# Patient Record
Sex: Female | Born: 1950 | Race: Black or African American | Hispanic: No | Marital: Married | Smoking: Former smoker
Health system: Southern US, Community
[De-identification: ages and names within clinical notes are randomized; demographics above are authoritative.]

## PROBLEM LIST (undated history)

## (undated) DIAGNOSIS — I10 Essential (primary) hypertension: Secondary | ICD-10-CM

## (undated) DIAGNOSIS — E7800 Pure hypercholesterolemia, unspecified: Secondary | ICD-10-CM

## (undated) DIAGNOSIS — K219 Gastro-esophageal reflux disease without esophagitis: Secondary | ICD-10-CM

## (undated) HISTORY — PX: LAPAROSCOPIC HYSTERECTOMY: SHX1926

## (undated) HISTORY — DX: Pure hypercholesterolemia, unspecified: E78.00

## (undated) HISTORY — DX: Essential (primary) hypertension: I10

---

## 2009-05-02 LAB — HM PAP SMEAR: HM Pap smear: NEGATIVE

## 2009-05-20 ENCOUNTER — Ambulatory Visit: Payer: Self-pay | Admitting: Internal Medicine

## 2009-05-24 ENCOUNTER — Ambulatory Visit: Payer: Self-pay | Admitting: Internal Medicine

## 2009-07-02 LAB — HM COLONOSCOPY

## 2009-07-14 ENCOUNTER — Ambulatory Visit: Payer: Self-pay | Admitting: Gastroenterology

## 2010-05-25 LAB — BASIC METABOLIC PANEL
BUN: 13 mg/dL (ref 4–21)
Creatinine: 0.9 mg/dL (ref <=1.1)
Glucose: 103 mg/dL

## 2010-05-27 ENCOUNTER — Ambulatory Visit: Payer: Self-pay | Admitting: Internal Medicine

## 2010-10-03 ENCOUNTER — Telehealth: Payer: Self-pay | Admitting: Internal Medicine

## 2010-10-03 NOTE — Telephone Encounter (Signed)
See below note Pt is completely out of rx

## 2010-10-03 NOTE — Telephone Encounter (Signed)
Home # [phone] Pt walked in wanting to her rx of  Losartan potassium 50 mg  Refilled.  [facility]  [phone]  Please let pt know when rx is sent to drug store

## 2010-10-04 ENCOUNTER — Other Ambulatory Visit: Payer: Self-pay | Admitting: Internal Medicine

## 2010-10-04 MED ORDER — LOSARTAN POTASSIUM 50 MG PO TABS: 50.0000 mg | ORAL_TABLET | Freq: Every day | ORAL | Status: DC

## 2010-10-04 NOTE — Telephone Encounter (Signed)
Taken care of.  Sent you the refill.  [facility]

## 2010-12-08 ENCOUNTER — Ambulatory Visit (INDEPENDENT_AMBULATORY_CARE_PROVIDER_SITE_OTHER): Payer: BC Managed Care – PPO | Admitting: Internal Medicine

## 2010-12-08 ENCOUNTER — Encounter: Payer: Self-pay | Admitting: Internal Medicine

## 2010-12-08 DIAGNOSIS — E669 Obesity, unspecified: Secondary | ICD-10-CM

## 2010-12-08 DIAGNOSIS — I10 Essential (primary) hypertension: Secondary | ICD-10-CM

## 2010-12-08 DIAGNOSIS — Z0000 Encounter for general adult medical examination without abnormal findings: Secondary | ICD-10-CM

## 2010-12-08 LAB — CBC WITH DIFFERENTIAL/PLATELET
Basophils Absolute: 0 10*3/uL (ref 0.0–0.1)
Eosinophils Absolute: 0.1 10*3/uL (ref 0.0–0.7)
Lymphocytes Relative: 30.4 % (ref 12.0–46.0)
MCHC: 33.2 g/dL (ref 30.0–36.0)
Monocytes Relative: 12.6 % — ABNORMAL HIGH (ref 3.0–12.0)
Neutrophils Relative %: 55.7 % (ref 43.0–77.0)
Platelets: 178 10*3/uL (ref 150.0–400.0)
RDW: 14.9 % — ABNORMAL HIGH (ref 11.5–14.6)

## 2010-12-08 LAB — COMPREHENSIVE METABOLIC PANEL
ALT: 15 U/L (ref 0–35)
CO2: 26 mEq/L (ref 19–32)
Calcium: 9 mg/dL (ref 8.4–10.5)
Chloride: 107 mEq/L (ref 96–112)
GFR: 68.18 mL/min (ref >60.00)
Potassium: 3.9 mEq/L (ref 3.5–5.1)
Sodium: 140 mEq/L (ref 135–145)
Total Protein: 7.3 g/dL (ref 6.0–8.3)

## 2010-12-08 LAB — MICROALBUMIN / CREATININE URINE RATIO
Creatinine,U: 67 mg/dL
Microalb, Ur: 1.8 mg/dL (ref 0.0–1.9)

## 2010-12-08 NOTE — Patient Instructions (Signed)
Labs today.  Follow up in 6 months. 

## 2010-12-08 NOTE — Progress Notes (Signed)
Subjective:    Patient ID: Jennifer Huff, female    DOB: 1950-09-22, 60 y.o.   MRN: [id number]  HPI 60YO female with HTN presents for annual exam. No complaints today. Reports she has been well. Normal appetite. Not physically active. Has not been checking her BP.  Reports full compliance with her medications.  No chest pain, headache, palpitations.  Outpatient Encounter Prescriptions as of 12/08/2010  Medication Sig Dispense Refill  . amLODipine (NORVASC) 5 MG tablet Take 5 mg by mouth daily.        Marland Kitchen losartan (COZAAR) 50 MG tablet Take 1 tablet (50 mg total) by mouth daily.  30 tablet  11    Review of Systems  Constitutional: Negative for fever, chills, appetite change, fatigue and unexpected weight change.  HENT: Negative for ear pain, congestion, sore throat, trouble swallowing, neck pain, voice change and sinus pressure.   Eyes: Negative for visual disturbance.  Respiratory: Negative for cough, shortness of breath, wheezing and stridor.   Cardiovascular: Negative for chest pain, palpitations and leg swelling.  Gastrointestinal: Negative for nausea, vomiting, abdominal pain, diarrhea, constipation, blood in stool, abdominal distention and anal bleeding.  Genitourinary: Negative for dysuria and flank pain.  Musculoskeletal: Negative for myalgias, arthralgias and gait problem.  Skin: Negative for color change and rash.  Neurological: Negative for dizziness and headaches.  Hematological: Negative for adenopathy. Does not bruise/bleed easily.  Psychiatric/Behavioral: Negative for suicidal ideas, sleep disturbance and dysphoric mood. The patient is not nervous/anxious.    BP 128/80  Pulse 72  Temp(Src) 98.4 F (36.9 C) (Oral)  Ht 5' 6.5" (1.689 m)  Wt 264 lb (119.75 kg)  BMI 41.97 kg/m2  SpO2 97%     Objective:   Physical Exam  Constitutional: She is oriented to person, place, and time. She appears well-developed and well-nourished. No distress.  HENT:  Head: Normocephalic  and atraumatic.  Right Ear: External ear normal.  Left Ear: External ear normal.  Nose: Nose normal.  Mouth/Throat: Oropharynx is clear and moist. No oropharyngeal exudate.  Eyes: Conjunctivae are normal. Pupils are equal, round, and reactive to light. Right eye exhibits no discharge. Left eye exhibits no discharge. No scleral icterus.  Neck: Normal range of motion. Neck supple. No tracheal deviation present. No thyromegaly present.  Cardiovascular: Normal rate, regular rhythm, normal heart sounds and intact distal pulses.  Exam reveals no gallop and no friction rub.   No murmur heard. Pulmonary/Chest: Effort normal and breath sounds normal. No respiratory distress. She has no wheezes. She has no rales. She exhibits no tenderness. Right breast exhibits no inverted nipple, no mass, no nipple discharge, no skin change and no tenderness. Left breast exhibits no inverted nipple, no mass, no nipple discharge, no skin change and no tenderness. Breasts are symmetrical.  Abdominal: Soft. Bowel sounds are normal. She exhibits no distension and no mass. There is no tenderness. There is no rebound and no guarding.  Genitourinary: Rectum normal and vagina normal. No breast swelling, tenderness, discharge or bleeding. Pelvic exam was performed with patient prone. There is no rash, tenderness or lesion on the right labia. There is no rash, tenderness or lesion on the left labia. Cervix exhibits no motion tenderness, no discharge and no friability. Right adnexum displays no mass, no tenderness and no fullness. Left adnexum displays no mass, no tenderness and no fullness. No erythema or tenderness around the vagina. No vaginal discharge found.  Musculoskeletal: Normal range of motion. She exhibits no edema and no tenderness.  Lymphadenopathy:  She has no cervical adenopathy.  Neurological: She is alert and oriented to person, place, and time. No cranial nerve deficit. She exhibits normal muscle tone. Coordination  normal.  Skin: Skin is warm and dry. No rash noted. She is not diaphoretic. No erythema. No pallor.  Psychiatric: She has a normal mood and affect. Her behavior is normal. Judgment and thought content normal.          Assessment & Plan:  1. General exam - Exam normal today including breast and pelvic exam.  PAP is pending.  Will check CBC, CMP, lipids.  Follow up in 6 months.  2. Hypertension - BP well controlled on amlodipine and losartan.  Will check CMP and urine microalbumin with labs today. Follow up in 6 months.  3. Obesity - BMI 41.  Discussed healthy diet, including reducing saturated fat and increasing fiber.  Discussed regular exercise with goal of 5 days per week.

## 2010-12-09 ENCOUNTER — Other Ambulatory Visit (HOSPITAL_COMMUNITY)
Admission: RE | Admit: 2010-12-09 | Discharge: 2010-12-09 | Disposition: A | Discharge status: Home / Self Care | Payer: BC Managed Care – PPO | Source: Ambulatory Visit | Attending: Internal Medicine | Admitting: Internal Medicine

## 2010-12-09 DIAGNOSIS — Z1159 Encounter for screening for other viral diseases: Secondary | ICD-10-CM | POA: Insufficient documentation

## 2010-12-09 DIAGNOSIS — Z01419 Encounter for gynecological examination (general) (routine) without abnormal findings: Secondary | ICD-10-CM | POA: Insufficient documentation

## 2010-12-09 LAB — HM PAP SMEAR: HM Pap smear: NORMAL

## 2010-12-09 NOTE — Progress Notes (Signed)
Addended by: Jobie Quaker on: 12/09/2010 01:58 PM   Modules accepted: Orders

## 2010-12-29 ENCOUNTER — Encounter: Payer: Self-pay | Admitting: Internal Medicine

## 2011-02-20 ENCOUNTER — Other Ambulatory Visit: Payer: Self-pay | Admitting: *Deleted

## 2011-02-20 ENCOUNTER — Telehealth: Payer: Self-pay | Admitting: *Deleted

## 2011-02-20 MED ORDER — AMLODIPINE BESYLATE 5 MG PO TABS: 5.0000 mg | ORAL_TABLET | Freq: Every day | ORAL | Status: DC

## 2011-02-20 NOTE — Telephone Encounter (Signed)
Pt left Vm Friday, that I am just checking now. Patient had no Rfs of amlodipine, wants a call to know if this is a med she should continue. (YES, and f/u in June) I attempted to call pt at wk, she is not at her desk, need to call pt later.

## 2011-02-22 NOTE — Telephone Encounter (Signed)
Left mess to call office back w/any continued questions. RF was sent in 2/18, as I was out of the office Friday 2/15

## 2011-02-23 ENCOUNTER — Telehealth: Payer: Self-pay | Admitting: *Deleted

## 2011-02-23 NOTE — Telephone Encounter (Signed)
Patient requesting a call back regarding amlodipine.

## 2011-02-24 NOTE — Telephone Encounter (Signed)
See other phone note, i left pt VM advising her to check w/pharm b/c RF was sent in

## 2011-05-30 ENCOUNTER — Telehealth: Payer: Self-pay | Admitting: Internal Medicine

## 2011-05-30 DIAGNOSIS — Z1231 Encounter for screening mammogram for malignant neoplasm of breast: Secondary | ICD-10-CM

## 2011-05-30 NOTE — Telephone Encounter (Signed)
Patient is scheduled for 06/07/11 at 8:00 at [facility].  I spoke with Selena Batten when making this appointment.  Patient is aware of the appointment.

## 2011-05-30 NOTE — Telephone Encounter (Signed)
Jennifer Huff 05/30/2011 1:47 PM Signed  Pt received letter [facility] stating its time for her mammogram  Pt would like early am appointment  Order has been placed in Epic/SLS

## 2011-05-30 NOTE — Telephone Encounter (Signed)
I don't see the order, please make sure it is placed so Dr. Dan Humphreys can sign and I can fax to them.

## 2011-05-30 NOTE — Telephone Encounter (Signed)
Sorry, got her mixed with another [facility] Mammogram order done today, order is in now. Thanks/SLS

## 2011-05-30 NOTE — Telephone Encounter (Signed)
Pt received letter Jennifer Huff stating its time for her mammogram Pt would like early am appointment

## 2011-05-31 NOTE — Telephone Encounter (Signed)
She can have a screening one (I don't see any history of breast cancer or other abnormality).

## 2011-05-31 NOTE — Telephone Encounter (Signed)
Dr. Dan Humphreys should this patient have a diagnostic mammogram, or a HM mammogram?  Jasmine December placed order but its for a diagnostic one and when I called [place] I only scheduled screening.

## 2011-06-01 NOTE — Telephone Encounter (Signed)
That is what I have scheduled the appointment for.

## 2011-06-06 LAB — HM MAMMOGRAPHY

## 2011-06-07 ENCOUNTER — Ambulatory Visit: Payer: Self-pay | Admitting: Internal Medicine

## 2011-06-09 ENCOUNTER — Encounter: Payer: Self-pay | Admitting: Internal Medicine

## 2011-06-09 ENCOUNTER — Ambulatory Visit (INDEPENDENT_AMBULATORY_CARE_PROVIDER_SITE_OTHER): Payer: BC Managed Care – PPO | Admitting: Internal Medicine

## 2011-06-09 VITALS — BP 120/82 | HR 74 | Temp 98.8°F | Wt 267.0 lb

## 2011-06-09 DIAGNOSIS — Z23 Encounter for immunization: Secondary | ICD-10-CM

## 2011-06-09 DIAGNOSIS — I10 Essential (primary) hypertension: Secondary | ICD-10-CM

## 2011-06-09 LAB — MICROALBUMIN / CREATININE URINE RATIO: Creatinine,U: 89.9 mg/dL

## 2011-06-09 LAB — COMPREHENSIVE METABOLIC PANEL
ALT: 12 U/L (ref 0–35)
AST: 20 U/L (ref 0–37)
BUN: 11 mg/dL (ref 6–23)
CO2: 29 mEq/L (ref 19–32)
Creatinine, Ser: 0.9 mg/dL (ref 0.4–1.2)
GFR: 83.28 mL/min (ref >60.00)
Total Bilirubin: 0.6 mg/dL (ref 0.3–1.2)

## 2011-06-09 MED ORDER — AMLODIPINE BESYLATE 5 MG PO TABS: 5.0000 mg | ORAL_TABLET | Freq: Every day | ORAL | Status: DC

## 2011-06-09 NOTE — Progress Notes (Signed)
  Subjective:    Patient ID: Jennifer Huff, female    DOB: 1950-12-03, 61 y.o.   MRN: [id number]  HPI  61 year old female with history of hypertension and obesity presents for followup. She reports that she is generally feeling well. In regards to her hypertension, she reports full compliance with medications. She has not been regularly checking her blood pressure at home. She denies any headache, chest pain, palpitations.  Outpatient Encounter Prescriptions as of 06/09/2011  Medication Sig Dispense Refill  . amLODipine (NORVASC) 5 MG tablet Take 1 tablet (5 mg total) by mouth daily.  90 tablet  4  . losartan (COZAAR) 50 MG tablet Take 1 tablet (50 mg total) by mouth daily.  30 tablet  11    Review of Systems  Constitutional: Negative for fever, chills, appetite change, fatigue and unexpected weight change.  HENT: Positive for rhinorrhea. Negative for ear pain, congestion, sore throat, trouble swallowing, neck pain, voice change and sinus pressure.   Eyes: Negative for visual disturbance.  Respiratory: Negative for cough, shortness of breath, wheezing and stridor.   Cardiovascular: Negative for chest pain, palpitations and leg swelling.  Gastrointestinal: Negative for nausea, vomiting, abdominal pain, diarrhea, constipation, blood in stool, abdominal distention and anal bleeding.  Genitourinary: Negative for dysuria and flank pain.  Musculoskeletal: Negative for myalgias, arthralgias and gait problem.  Skin: Negative for color change and rash.  Neurological: Negative for dizziness and headaches.  Hematological: Negative for adenopathy. Does not bruise/bleed easily.  Psychiatric/Behavioral: Negative for suicidal ideas, sleep disturbance and dysphoric mood. The patient is not nervous/anxious.    BP 120/82  Pulse 74  Temp 98.8 F (37.1 C)  Wt 267 lb (121.11 kg)  SpO2 97%     Objective:   Physical Exam  Constitutional: She is oriented to person, place, and time. She appears  well-developed and well-nourished. No distress.  HENT:  Head: Normocephalic and atraumatic.  Right Ear: External ear normal.  Left Ear: External ear normal.  Nose: Nose normal.  Mouth/Throat: Oropharynx is clear and moist. No oropharyngeal exudate.  Eyes: Conjunctivae are normal. Pupils are equal, round, and reactive to light. Right eye exhibits no discharge. Left eye exhibits no discharge. No scleral icterus.  Neck: Normal range of motion. Neck supple. No tracheal deviation present. No thyromegaly present.  Cardiovascular: Normal rate, regular rhythm, normal heart sounds and intact distal pulses.  Exam reveals no gallop and no friction rub.   No murmur heard. Pulmonary/Chest: Effort normal and breath sounds normal. No respiratory distress. She has no wheezes. She has no rales. She exhibits no tenderness.  Musculoskeletal: Normal range of motion. She exhibits no edema and no tenderness.  Lymphadenopathy:    She has no cervical adenopathy.  Neurological: She is alert and oriented to person, place, and time. No cranial nerve deficit. She exhibits normal muscle tone. Coordination normal.  Skin: Skin is warm and dry. No rash noted. She is not diaphoretic. No erythema. No pallor.  Psychiatric: She has a normal mood and affect. Her behavior is normal. Judgment and thought content normal.          Assessment & Plan:

## 2011-06-09 NOTE — Assessment & Plan Note (Signed)
Blood pressure well-controlled on current medications. Will check renal function with labs today. Patient will followup in 6 months or sooner if needed.

## 2011-09-06 ENCOUNTER — Other Ambulatory Visit: Payer: Self-pay | Admitting: *Deleted

## 2011-09-06 MED ORDER — LOSARTAN POTASSIUM 50 MG PO TABS: 50.0000 mg | ORAL_TABLET | Freq: Every day | ORAL | Status: DC

## 2011-12-11 ENCOUNTER — Ambulatory Visit (INDEPENDENT_AMBULATORY_CARE_PROVIDER_SITE_OTHER): Payer: BC Managed Care – PPO | Admitting: Internal Medicine

## 2011-12-11 ENCOUNTER — Encounter: Payer: Self-pay | Admitting: Internal Medicine

## 2011-12-11 VITALS — BP 128/72 | HR 77 | Temp 98.5°F | Resp 16 | Ht 66.75 in | Wt 262.8 lb

## 2011-12-11 DIAGNOSIS — Z1331 Encounter for screening for depression: Secondary | ICD-10-CM

## 2011-12-11 DIAGNOSIS — Z0000 Encounter for general adult medical examination without abnormal findings: Secondary | ICD-10-CM

## 2011-12-11 LAB — CBC WITH DIFFERENTIAL/PLATELET
Basophils Absolute: 0 10*3/uL (ref 0.0–0.1)
Eosinophils Absolute: 0.1 10*3/uL (ref 0.0–0.7)
Hemoglobin: 12.6 g/dL (ref 12.0–15.0)
Lymphocytes Relative: 29.8 % (ref 12.0–46.0)
MCHC: 32.5 g/dL (ref 30.0–36.0)
Monocytes Relative: 10.9 % (ref 3.0–12.0)
Neutro Abs: 3.4 10*3/uL (ref 1.4–7.7)
Neutrophils Relative %: 57.4 % (ref 43.0–77.0)
RDW: 14.4 % (ref 11.5–14.6)

## 2011-12-11 LAB — COMPREHENSIVE METABOLIC PANEL
ALT: 15 U/L (ref 0–35)
AST: 18 U/L (ref 0–37)
Albumin: 4 g/dL (ref 3.5–5.2)
BUN: 13 mg/dL (ref 6–23)
CO2: 28 mEq/L (ref 19–32)
Calcium: 9.4 mg/dL (ref 8.4–10.5)
Chloride: 105 mEq/L (ref 96–112)
Creatinine, Ser: 1.1 mg/dL (ref 0.4–1.2)
GFR: 64.43 mL/min (ref >60.00)
Potassium: 4.1 mEq/L (ref 3.5–5.1)

## 2011-12-11 LAB — LIPID PANEL
Cholesterol: 175 mg/dL (ref 0–200)
HDL: 47.2 mg/dL (ref >39.00)
Triglycerides: 92 mg/dL (ref 0.0–149.0)
VLDL: 18.4 mg/dL (ref 0.0–40.0)

## 2011-12-11 LAB — MICROALBUMIN / CREATININE URINE RATIO: Microalb Creat Ratio: 4.3 mg/g (ref 0.0–30.0)

## 2011-12-11 MED ORDER — ZOSTER VACCINE LIVE 19400 UNT/0.65ML ~~LOC~~ SOLR: 0.6500 mL | Freq: Once | SUBCUTANEOUS | Status: DC

## 2011-12-11 NOTE — Assessment & Plan Note (Signed)
General medical exam including breast exam is normal today. Pap is deferred as this was normal in December 2012. Health maintenance is up to date with the exception of Zostavax, prescription given for this today. Will check basic labs including CBC, CMP, lipid profile. Encouraged better compliance with healthy diet and regular physical activity such as walking. Followup in 6 months or sooner as needed.

## 2011-12-11 NOTE — Progress Notes (Signed)
Subjective:    Patient ID: Jennifer Huff, female    DOB: 1950-03-15, 61 y.o.   MRN: [id number]  HPI 61 year old female with history of hypertension presents for annual exam. She reports she is generally doing well. She reports full compliance with her medications. She does not check her blood pressure at home. She denies any chest pain, headache, palpitations. She reports a normal appetite. She denies any change in bowel habits. She notes that she follows a healthy diet, typically eating one big meal per day. This usually consists of fast food. She does not exercise.  Outpatient Encounter Prescriptions as of 12/11/2011  Medication Sig Dispense Refill  . amLODipine (NORVASC) 5 MG tablet Take 1 tablet (5 mg total) by mouth daily.  90 tablet  4  . losartan (COZAAR) 50 MG tablet Take 1 tablet (50 mg total) by mouth daily.  90 tablet  3  . zoster vaccine live, PF, (ZOSTAVAX) 40981 UNT/0.65ML injection Inject 19,400 Units into the skin once.  1 each  0   BP 128/72  Pulse 77  Temp 98.5 F (36.9 C) (Oral)  Resp 16  Ht 5' 6.75" (1.695 m)  Wt 262 lb 12 oz (119.183 kg)  BMI 41.46 kg/m2  SpO2 99%  Review of Systems  Constitutional: Negative for fever, chills, appetite change, fatigue and unexpected weight change.  HENT: Negative for ear pain, congestion, sore throat, trouble swallowing, neck pain, voice change and sinus pressure.   Eyes: Negative for visual disturbance.  Respiratory: Negative for cough, shortness of breath, wheezing and stridor.   Cardiovascular: Negative for chest pain, palpitations and leg swelling.  Gastrointestinal: Negative for nausea, vomiting, abdominal pain, diarrhea, constipation, blood in stool, abdominal distention and anal bleeding.  Genitourinary: Negative for dysuria and flank pain.  Musculoskeletal: Negative for myalgias, arthralgias and gait problem.  Skin: Negative for color change and rash.  Neurological: Negative for dizziness and headaches.   Hematological: Negative for adenopathy. Does not bruise/bleed easily.  Psychiatric/Behavioral: Negative for suicidal ideas, sleep disturbance and dysphoric mood. The patient is not nervous/anxious.        Objective:   Physical Exam  Constitutional: She is oriented to person, place, and time. She appears well-developed and well-nourished. No distress.  HENT:  Head: Normocephalic and atraumatic.  Right Ear: External ear normal.  Left Ear: External ear normal.  Nose: Nose normal.  Mouth/Throat: Oropharynx is clear and moist. No oropharyngeal exudate.  Eyes: Conjunctivae normal are normal. Pupils are equal, round, and reactive to light. Right eye exhibits no discharge. Left eye exhibits no discharge. No scleral icterus.  Neck: Normal range of motion. Neck supple. No tracheal deviation present. No thyromegaly present.  Cardiovascular: Normal rate, regular rhythm, normal heart sounds and intact distal pulses.  Exam reveals no gallop and no friction rub.   No murmur heard. Pulmonary/Chest: Effort normal and breath sounds normal. No accessory muscle usage. Not tachypneic. No respiratory distress. She has no decreased breath sounds. She has no wheezes. She has no rales. She exhibits no tenderness. Right breast exhibits no inverted nipple, no mass, no nipple discharge, no skin change and no tenderness. Left breast exhibits no inverted nipple, no mass, no nipple discharge, no skin change and no tenderness. Breasts are symmetrical.  Abdominal: Soft. Bowel sounds are normal. She exhibits no distension and no mass. There is no tenderness. There is no rebound and no guarding.  Musculoskeletal: Normal range of motion. She exhibits no edema and no tenderness.  Lymphadenopathy:    She has  no cervical adenopathy.  Neurological: She is alert and oriented to person, place, and time. No cranial nerve deficit. She exhibits normal muscle tone. Coordination normal.  Skin: Skin is warm and dry. Lesion (few skin  colored papules c/w achrocordons noted over anterior chest and neck) noted. No rash noted. She is not diaphoretic. No erythema. No pallor.  Psychiatric: She has a normal mood and affect. Her behavior is normal. Judgment and thought content normal.          Assessment & Plan:

## 2011-12-12 LAB — VITAMIN D 25 HYDROXY (VIT D DEFICIENCY, FRACTURES): Vit D, 25-Hydroxy: 23 ng/mL — ABNORMAL LOW (ref 30–89)

## 2012-06-07 ENCOUNTER — Ambulatory Visit: Payer: Self-pay | Admitting: Internal Medicine

## 2012-06-10 ENCOUNTER — Ambulatory Visit (INDEPENDENT_AMBULATORY_CARE_PROVIDER_SITE_OTHER): Payer: BC Managed Care – PPO | Admitting: Internal Medicine

## 2012-06-10 ENCOUNTER — Encounter: Payer: Self-pay | Admitting: Internal Medicine

## 2012-06-10 VITALS — BP 126/72 | HR 71 | Temp 98.4°F | Wt 265.0 lb

## 2012-06-10 DIAGNOSIS — I10 Essential (primary) hypertension: Secondary | ICD-10-CM

## 2012-06-10 LAB — COMPREHENSIVE METABOLIC PANEL
AST: 17 U/L (ref 0–37)
Alkaline Phosphatase: 68 U/L (ref 39–117)
BUN: 12 mg/dL (ref 6–23)
Creatinine, Ser: 1.1 mg/dL (ref 0.4–1.2)
Potassium: 3.9 mEq/L (ref 3.5–5.1)
Total Bilirubin: 0.6 mg/dL (ref 0.3–1.2)

## 2012-06-10 LAB — MICROALBUMIN / CREATININE URINE RATIO
Creatinine,U: 80.2 mg/dL
Microalb Creat Ratio: 4.4 mg/g (ref 0.0–30.0)

## 2012-06-10 MED ORDER — AMLODIPINE BESYLATE 5 MG PO TABS: 5.0000 mg | ORAL_TABLET | Freq: Every day | ORAL | Status: DC

## 2012-06-10 MED ORDER — LOSARTAN POTASSIUM 50 MG PO TABS: 50.0000 mg | ORAL_TABLET | Freq: Every day | ORAL | Status: DC

## 2012-06-10 NOTE — Progress Notes (Signed)
  Subjective:    Patient ID: Jennifer Huff, female    DOB: July 25, 1950, 62 y.o.   MRN: [id number]  HPI 62YO female with h/o HTN presents for follow up. Doing well. No concerns today. Compliant with meds. No chest pain, palpitations, headache. No new concerns.  Outpatient Encounter Prescriptions as of 06/10/2012  Medication Sig Dispense Refill  . amLODipine (NORVASC) 5 MG tablet Take 1 tablet (5 mg total) by mouth daily.  90 tablet  4  . losartan (COZAAR) 50 MG tablet Take 1 tablet (50 mg total) by mouth daily.  90 tablet  4   No facility-administered encounter medications on file as of 06/10/2012.   BP 126/72  Pulse 71  Temp(Src) 98.4 F (36.9 C) (Oral)  Wt 265 lb (120.203 kg)  BMI 41.84 kg/m2  SpO2 96%  Review of Systems  Constitutional: Negative for fever, chills, appetite change, fatigue and unexpected weight change.  HENT: Negative for ear pain, congestion, sore throat, trouble swallowing, neck pain, voice change and sinus pressure.   Eyes: Negative for visual disturbance.  Respiratory: Negative for cough, shortness of breath, wheezing and stridor.   Cardiovascular: Negative for chest pain, palpitations and leg swelling.  Gastrointestinal: Negative for nausea, vomiting, abdominal pain, diarrhea, constipation, blood in stool, abdominal distention and anal bleeding.  Genitourinary: Negative for dysuria and flank pain.  Musculoskeletal: Negative for myalgias, arthralgias and gait problem.  Skin: Negative for color change and rash.  Neurological: Negative for dizziness and headaches.  Hematological: Negative for adenopathy. Does not bruise/bleed easily.  Psychiatric/Behavioral: Negative for suicidal ideas, sleep disturbance and dysphoric mood. The patient is not nervous/anxious.        Objective:   Physical Exam  Constitutional: She is oriented to person, place, and time. She appears well-developed and well-nourished. No distress.  HENT:  Head: Normocephalic and atraumatic.   Right Ear: External ear normal.  Left Ear: External ear normal.  Nose: Nose normal.  Mouth/Throat: Oropharynx is clear and moist. No oropharyngeal exudate.  Eyes: Conjunctivae are normal. Pupils are equal, round, and reactive to light. Right eye exhibits no discharge. Left eye exhibits no discharge. No scleral icterus.  Neck: Normal range of motion. Neck supple. No tracheal deviation present. No thyromegaly present.  Cardiovascular: Normal rate, regular rhythm, normal heart sounds and intact distal pulses.  Exam reveals no gallop and no friction rub.   No murmur heard. Pulmonary/Chest: Effort normal and breath sounds normal. No accessory muscle usage. Not tachypneic. No respiratory distress. She has no decreased breath sounds. She has no wheezes. She has no rhonchi. She has no rales. She exhibits no tenderness.  Musculoskeletal: Normal range of motion. She exhibits no edema and no tenderness.  Lymphadenopathy:    She has no cervical adenopathy.  Neurological: She is alert and oriented to person, place, and time. No cranial nerve deficit. She exhibits normal muscle tone. Coordination normal.  Skin: Skin is warm and dry. No rash noted. She is not diaphoretic. No erythema. No pallor.  Psychiatric: She has a normal mood and affect. Her behavior is normal. Judgment and thought content normal.          Assessment & Plan:

## 2012-06-10 NOTE — Assessment & Plan Note (Signed)
BP Readings from Last 3 Encounters:  06/10/12 126/72  12/11/11 128/72  06/09/11 120/82   BP well controlled on current medications. Will continue. Will check renal function with labs today.

## 2012-06-24 ENCOUNTER — Encounter: Payer: Self-pay | Admitting: Internal Medicine

## 2012-12-16 ENCOUNTER — Ambulatory Visit (INDEPENDENT_AMBULATORY_CARE_PROVIDER_SITE_OTHER): Payer: BC Managed Care – PPO | Admitting: Internal Medicine

## 2012-12-16 ENCOUNTER — Encounter: Payer: Self-pay | Admitting: *Deleted

## 2012-12-16 ENCOUNTER — Encounter (INDEPENDENT_AMBULATORY_CARE_PROVIDER_SITE_OTHER): Payer: Self-pay

## 2012-12-16 ENCOUNTER — Encounter: Payer: Self-pay | Admitting: Internal Medicine

## 2012-12-16 VITALS — BP 130/82 | HR 68 | Temp 98.2°F | Ht 66.0 in | Wt 263.0 lb

## 2012-12-16 DIAGNOSIS — Z0000 Encounter for general adult medical examination without abnormal findings: Secondary | ICD-10-CM

## 2012-12-16 DIAGNOSIS — I10 Essential (primary) hypertension: Secondary | ICD-10-CM

## 2012-12-16 LAB — CBC WITH DIFFERENTIAL/PLATELET
Basophils Relative: 0.4 % (ref 0.0–3.0)
Eosinophils Absolute: 0.1 10*3/uL (ref 0.0–0.7)
Lymphocytes Relative: 29 % (ref 12.0–46.0)
MCHC: 33.8 g/dL (ref 30.0–36.0)
Monocytes Absolute: 0.7 10*3/uL (ref 0.1–1.0)
Neutrophils Relative %: 58.5 % (ref 43.0–77.0)
Platelets: 174 10*3/uL (ref 150.0–400.0)
RBC: 4.5 Mil/uL (ref 3.87–5.11)
WBC: 6.6 10*3/uL (ref 4.5–10.5)

## 2012-12-16 LAB — COMPREHENSIVE METABOLIC PANEL
ALT: 14 U/L (ref 0–35)
AST: 18 U/L (ref 0–37)
Albumin: 4 g/dL (ref 3.5–5.2)
Alkaline Phosphatase: 79 U/L (ref 39–117)
Calcium: 9.2 mg/dL (ref 8.4–10.5)
Chloride: 106 mEq/L (ref 96–112)
Potassium: 3.9 mEq/L (ref 3.5–5.1)
Sodium: 139 mEq/L (ref 135–145)

## 2012-12-16 LAB — LIPID PANEL
LDL Cholesterol: 112 mg/dL — ABNORMAL HIGH (ref 0–99)
Total CHOL/HDL Ratio: 4

## 2012-12-16 LAB — MICROALBUMIN / CREATININE URINE RATIO
Creatinine,U: 139.8 mg/dL
Microalb Creat Ratio: 3 mg/g (ref 0.0–30.0)
Microalb, Ur: 4.2 mg/dL — ABNORMAL HIGH (ref 0.0–1.9)

## 2012-12-16 LAB — TSH: TSH: 1.49 u[IU]/mL (ref 0.35–5.50)

## 2012-12-16 NOTE — Progress Notes (Signed)
   Subjective:    Patient ID: Jennifer Huff, female    DOB: [birth year], 62 y.o.   MRN: [id number]  HPI 62YO female with HTN presents for annual exam. Doing well. No concerns today. Compliant with medication. Trying to follow healthier diet and gets some activity by walking. UTD on flu vaccine, mammogram and PAP.  Outpatient Encounter Prescriptions as of 12/16/2012  Medication Sig  . amLODipine (NORVASC) 5 MG tablet Take 1 tablet (5 mg total) by mouth daily.  Marland Kitchen losartan (COZAAR) 50 MG tablet Take 1 tablet (50 mg total) by mouth daily.   BP 130/82  Pulse 68  Temp(Src) 98.2 F (36.8 C) (Oral)  Ht 5\' 6"  (1.676 m)  SpO2 97%  Review of Systems  Constitutional: Negative for fever, chills, appetite change, fatigue and unexpected weight change.  HENT: Negative for congestion, ear pain, sinus pressure, sore throat, trouble swallowing and voice change.   Eyes: Negative for visual disturbance.  Respiratory: Negative for cough, shortness of breath, wheezing and stridor.   Cardiovascular: Negative for chest pain, palpitations and leg swelling.  Gastrointestinal: Negative for nausea, vomiting, abdominal pain, diarrhea, constipation, blood in stool, abdominal distention and anal bleeding.  Genitourinary: Negative for dysuria and flank pain.  Musculoskeletal: Negative for arthralgias, gait problem, myalgias and neck pain.  Skin: Negative for color change and rash.  Neurological: Negative for dizziness and headaches.  Hematological: Negative for adenopathy. Does not bruise/bleed easily.  Psychiatric/Behavioral: Negative for suicidal ideas, sleep disturbance and dysphoric mood. The patient is not nervous/anxious.        Objective:   Physical Exam  Constitutional: She is oriented to person, place, and time. She appears well-developed and well-nourished. No distress.  HENT:  Head: Normocephalic and atraumatic.  Right Ear: External ear normal.  Left Ear: External ear normal.  Nose: Nose  normal.  Mouth/Throat: Oropharynx is clear and moist. No oropharyngeal exudate.  Eyes: Conjunctivae are normal. Pupils are equal, round, and reactive to light. Right eye exhibits no discharge. Left eye exhibits no discharge. No scleral icterus.  Neck: Normal range of motion. Neck supple. No tracheal deviation present. No thyromegaly present.  Cardiovascular: Normal rate, regular rhythm, normal heart sounds and intact distal pulses.  Exam reveals no gallop and no friction rub.   No murmur heard. Pulmonary/Chest: Effort normal and breath sounds normal. No accessory muscle usage. Not tachypneic. No respiratory distress. She has no decreased breath sounds. She has no wheezes. She has no rales. She exhibits no tenderness. Right breast exhibits no inverted nipple, no mass, no nipple discharge, no skin change and no tenderness. Left breast exhibits no inverted nipple, no mass, no nipple discharge, no skin change and no tenderness. Breasts are symmetrical.  Abdominal: Soft. Bowel sounds are normal. She exhibits no distension and no mass. There is no tenderness. There is no rebound and no guarding.  Musculoskeletal: Normal range of motion. She exhibits no edema and no tenderness.  Lymphadenopathy:    She has no cervical adenopathy.  Neurological: She is alert and oriented to person, place, and time. No cranial nerve deficit. She exhibits normal muscle tone. Coordination normal.  Skin: Skin is warm and dry. No rash noted. She is not diaphoretic. No erythema. No pallor.  Psychiatric: She has a normal mood and affect. Her behavior is normal. Judgment and thought content normal.          Assessment & Plan:

## 2012-12-16 NOTE — Assessment & Plan Note (Signed)
General medical exam including breast exam normal today. PAP and pelvic deferred as normal, HPV neg in 2012, plan repeat 2015. Mammogram UTD and reviewed from 06/2012. Colonoscopy UTD. Will check labs today including CBC, CMP, lipids, Vit D, TSH. Encouraged healthy diet and exercise with goal of weight loss. Immunizations UTD.

## 2012-12-16 NOTE — Progress Notes (Signed)
Pre-visit discussion using our clinic review tool. No additional management support is needed unless otherwise documented below in the visit note.  

## 2012-12-17 ENCOUNTER — Encounter: Payer: Self-pay | Admitting: *Deleted

## 2013-03-10 ENCOUNTER — Encounter: Payer: Self-pay | Admitting: Adult Health

## 2013-03-10 ENCOUNTER — Ambulatory Visit (INDEPENDENT_AMBULATORY_CARE_PROVIDER_SITE_OTHER): Payer: BC Managed Care – PPO | Admitting: Adult Health

## 2013-03-10 VITALS — BP 122/78 | HR 78 | Temp 98.7°F | Resp 12 | Wt 265.0 lb

## 2013-03-10 DIAGNOSIS — M25541 Pain in joints of right hand: Secondary | ICD-10-CM | POA: Insufficient documentation

## 2013-03-10 DIAGNOSIS — M7710 Lateral epicondylitis, unspecified elbow: Secondary | ICD-10-CM

## 2013-03-10 DIAGNOSIS — M778 Other enthesopathies, not elsewhere classified: Secondary | ICD-10-CM

## 2013-03-10 DIAGNOSIS — M7581 Other shoulder lesions, right shoulder: Principal | ICD-10-CM

## 2013-03-10 DIAGNOSIS — M719 Bursopathy, unspecified: Secondary | ICD-10-CM

## 2013-03-10 DIAGNOSIS — M67919 Unspecified disorder of synovium and tendon, unspecified shoulder: Secondary | ICD-10-CM

## 2013-03-10 DIAGNOSIS — M79609 Pain in unspecified limb: Secondary | ICD-10-CM

## 2013-03-10 MED ORDER — CYCLOBENZAPRINE HCL 5 MG PO TABS: 5.0000 mg | ORAL_TABLET | Freq: Three times a day (TID) | ORAL | Status: DC | PRN

## 2013-03-10 NOTE — Progress Notes (Signed)
Pre visit review using our clinic review tool, if applicable. No additional management support is needed unless otherwise documented below in the visit note. 

## 2013-03-10 NOTE — Patient Instructions (Addendum)
   Take ibuprofen 400 mg every 6 hours for the next 4 days. Take with food.  You can take tylenol as well. Do not exceed more than 4000 mg in a 24 hour period.  For your trigger finger (thumb) use a wrist splint that will keep your thumb immobilized.   Also take flexeril for muscle spasms. This will make you sleepy so only take this at bedtime when you are working.  Apply ice alternating with moist heat to the affected areas for 15 min at a time. Do this approximately 3-4 times daily.  If no improvement after 1 week please let me know and I will refer you to orthopedic.

## 2013-03-10 NOTE — Progress Notes (Signed)
Patient ID: Jennifer Huff, female   DOB: 11/08/1950, 63 y.o.   MRN: [id number]    Subjective:    Patient ID: Jennifer Huff, female    DOB: Sep 10, 1950, 63 y.o.   MRN: [id number]  HPI  Pt is a pleasant 63 y/o female who presents to clinic with c/o of right shoulder, elbow, thumb pain. Symptoms began on 03/07/13. She has been taking tylenol 325 mg every 6 hours. She does not recall doing anything that may have contributed to her pain. She reports that when she puts on her seat belt she reaches back with her right arm and has noticed some discomfort. She also reports having a 68 month old grandson which she often carries. She works as a Licensed conveyancer and carries heavy books which could be contributing to her symptoms. She is also experiencing some shooting pain but this is not continuous.   Past Medical History  Diagnosis Date  . HTN (hypertension)   . Vitamin D deficiency   . Cataract     Current Outpatient Prescriptions on File Prior to Visit  Medication Sig Dispense Refill  . amLODipine (NORVASC) 5 MG tablet Take 1 tablet (5 mg total) by mouth daily.  90 tablet  4  . losartan (COZAAR) 50 MG tablet Take 1 tablet (50 mg total) by mouth daily.  90 tablet  4   No current facility-administered medications on file prior to visit.     Review of Systems  Musculoskeletal:       Right shoulder, elbow, thumb pain - trigger finger  Neurological: Negative for numbness.  All other systems reviewed and are negative.       Objective:  BP 122/78  Pulse 78  Temp(Src) 98.7 F (37.1 C) (Oral)  Resp 12  Wt 265 lb (120.203 kg)  SpO2 96%   Physical Exam  Constitutional: She is oriented to person, place, and time. No distress.  Cardiovascular: Normal rate and regular rhythm.   Pulmonary/Chest: Effort normal. No respiratory distress.  Musculoskeletal: Normal range of motion. She exhibits tenderness. She exhibits no edema.  Right thumb trigger "catches". Pain with palpation of lateral  epicondyle. Shoulder did not exhibit any point tenderness. She does have tenderness with palpation of trapezius muscle above shoulder. Full ROM. No crepitus observed.  Neurological: She is alert and oriented to person, place, and time. Coordination normal.       Assessment & Plan:    1. Tendonitis of shoulder, right Conservative treatment involving rest, ibuprofen for inflammation, ice, heat. If no improvement within 1 week will refer to ortho  2. Epicondylitis, lateral Suspect related to job with lifting heavy books. Conservative treatment. See pt instructions for specific POC.  3. Pain in thumb joint with movement of right hand ?De Quervain's tenosynovitis. Conservative treatment. See pt instructions for specific POC. Wrist splint.

## 2013-04-03 ENCOUNTER — Encounter: Payer: Self-pay | Admitting: Internal Medicine

## 2013-06-16 ENCOUNTER — Ambulatory Visit: Payer: BC Managed Care – PPO | Admitting: Internal Medicine

## 2013-06-18 ENCOUNTER — Ambulatory Visit (INDEPENDENT_AMBULATORY_CARE_PROVIDER_SITE_OTHER): Payer: BC Managed Care – PPO | Admitting: Internal Medicine

## 2013-06-18 ENCOUNTER — Encounter: Payer: Self-pay | Admitting: Internal Medicine

## 2013-06-18 VITALS — BP 120/82 | HR 73 | Temp 98.2°F | Ht 66.0 in | Wt 263.8 lb

## 2013-06-18 DIAGNOSIS — I10 Essential (primary) hypertension: Secondary | ICD-10-CM

## 2013-06-18 DIAGNOSIS — E669 Obesity, unspecified: Secondary | ICD-10-CM

## 2013-06-18 LAB — COMPREHENSIVE METABOLIC PANEL
ALT: 15 U/L (ref 0–35)
AST: 16 U/L (ref 0–37)
Albumin: 3.8 g/dL (ref 3.5–5.2)
Alkaline Phosphatase: 74 U/L (ref 39–117)
BUN: 9 mg/dL (ref 6–23)
CALCIUM: 9.3 mg/dL (ref 8.4–10.5)
CHLORIDE: 110 meq/L (ref 96–112)
CO2: 26 meq/L (ref 19–32)
Creatinine, Ser: 1 mg/dL (ref 0.4–1.2)
GFR: 70.67 mL/min (ref >60.00)
GLUCOSE: 130 mg/dL — AB (ref 70–99)
POTASSIUM: 3.9 meq/L (ref 3.5–5.1)
Sodium: 142 mEq/L (ref 135–145)
TOTAL PROTEIN: 6.9 g/dL (ref 6.0–8.3)
Total Bilirubin: 0.4 mg/dL (ref 0.2–1.2)

## 2013-06-18 LAB — MICROALBUMIN / CREATININE URINE RATIO
Creatinine,U: 112.9 mg/dL
MICROALB/CREAT RATIO: 5.6 mg/g (ref 0.0–30.0)
Microalb, Ur: 6.3 mg/dL — ABNORMAL HIGH (ref 0.0–1.9)

## 2013-06-18 MED ORDER — LOSARTAN POTASSIUM 50 MG PO TABS: 50.0000 mg | ORAL_TABLET | Freq: Every day | ORAL | Status: DC

## 2013-06-18 MED ORDER — AMLODIPINE BESYLATE 5 MG PO TABS: 5.0000 mg | ORAL_TABLET | Freq: Every day | ORAL | Status: DC

## 2013-06-18 NOTE — Assessment & Plan Note (Signed)
BP Readings from Last 3 Encounters:  06/18/13 120/82  03/10/13 122/78  12/16/12 130/82   BP well controlled on current medication. Will check renal function with labs.

## 2013-06-18 NOTE — Progress Notes (Signed)
Subjective:    Patient ID: Jennifer Huff, female    DOB: 1950/11/03, 62 y.o.   MRN: [id number]  HPI 63YO female presents for follow up. Doing well. No concerns today. Compliant with meds. Has occasional aching pain in left elbow and in back. Attributes to work activities. Using prn Tylenol with some improvement.   Review of Systems  Constitutional: Negative for fever, chills, appetite change, fatigue and unexpected weight change.  HENT: Negative for congestion, ear pain, sinus pressure, sore throat, trouble swallowing and voice change.   Eyes: Negative for visual disturbance.  Respiratory: Negative for cough and shortness of breath.   Cardiovascular: Negative for chest pain, palpitations and leg swelling.  Gastrointestinal: Negative for abdominal pain.  Genitourinary: Negative for dysuria and flank pain.  Musculoskeletal: Positive for arthralgias and back pain. Negative for gait problem and neck pain.  Skin: Negative for color change and rash.  Neurological: Negative for dizziness and headaches.  Hematological: Negative for adenopathy. Does not bruise/bleed easily.  Psychiatric/Behavioral: Negative for suicidal ideas, sleep disturbance and dysphoric mood. The patient is not nervous/anxious.        Objective:    BP 120/82  Pulse 73  Temp(Src) 98.2 F (36.8 C) (Oral)  Ht 5\' 6"  (1.676 m)  Wt 263 lb 12 oz (119.636 kg)  BMI 42.59 kg/m2  SpO2 97% Physical Exam  Constitutional: She is oriented to person, place, and time. She appears well-developed and well-nourished. No distress.  HENT:  Head: Normocephalic and atraumatic.  Right Ear: External ear normal.  Left Ear: External ear normal.  Nose: Nose normal.  Mouth/Throat: Oropharynx is clear and moist. No oropharyngeal exudate.  Eyes: Conjunctivae are normal. Pupils are equal, round, and reactive to light. Right eye exhibits no discharge. Left eye exhibits no discharge. No scleral icterus.  Neck: Normal range of motion. Neck  supple. No tracheal deviation present. No thyromegaly present.  Cardiovascular: Normal rate, regular rhythm, normal heart sounds and intact distal pulses.  Exam reveals no gallop and no friction rub.   No murmur heard. Pulmonary/Chest: Effort normal and breath sounds normal. No accessory muscle usage. Not tachypneic. No respiratory distress. She has no decreased breath sounds. She has no wheezes. She has no rhonchi. She has no rales. She exhibits no tenderness.  Musculoskeletal: Normal range of motion. She exhibits no edema and no tenderness.  Lymphadenopathy:    She has no cervical adenopathy.  Neurological: She is alert and oriented to person, place, and time. No cranial nerve deficit. She exhibits normal muscle tone. Coordination normal.  Skin: Skin is warm and dry. No rash noted. She is not diaphoretic. No erythema. No pallor.  Psychiatric: She has a normal mood and affect. Her behavior is normal. Judgment and thought content normal.          Assessment & Plan:   Problem List Items Addressed This Visit     Unprioritized   Hypertension - Primary      BP Readings from Last 3 Encounters:  06/18/13 120/82  03/10/13 122/78  12/16/12 130/82   BP well controlled on current medication. Will check renal function with labs.    Relevant Medications      losartan (COZAAR) tablet      amLODIpine (NORVASC) tablet   Other Relevant Orders      Microalbumin / creatinine urine ratio      Comprehensive metabolic panel   Obesity      Wt Readings from Last 3 Encounters:  06/18/13 263 lb 12 oz (  119.636 kg)  03/10/13 265 lb (120.203 kg)  12/16/12 263 lb (119.296 kg)   Body mass index is 42.59 kg/(m^2). Encouraged healthy diet and regular exercise with goal of weight loss.        Return in about 6 months (around 12/18/2013) for Physical.

## 2013-06-18 NOTE — Assessment & Plan Note (Signed)
Wt Readings from Last 3 Encounters:  06/18/13 263 lb 12 oz (119.636 kg)  03/10/13 265 lb (120.203 kg)  12/16/12 263 lb (119.296 kg)   Body mass index is 42.59 kg/(m^2). Encouraged healthy diet and regular exercise with goal of weight loss.

## 2013-06-18 NOTE — Progress Notes (Signed)
Pre visit review using our clinic review tool, if applicable. No additional management support is needed unless otherwise documented below in the visit note. 

## 2013-06-18 NOTE — Patient Instructions (Signed)
We will check labs today.  Follow up for physical in 6 months.

## 2013-06-19 ENCOUNTER — Telehealth: Payer: Self-pay | Admitting: Internal Medicine

## 2013-06-19 NOTE — Telephone Encounter (Signed)
Relevant patient education assigned to patient using Emmi. ° °

## 2013-06-30 ENCOUNTER — Ambulatory Visit: Payer: Self-pay | Admitting: Internal Medicine

## 2013-06-30 ENCOUNTER — Encounter: Payer: Self-pay | Admitting: *Deleted

## 2013-06-30 LAB — HM MAMMOGRAPHY: HM MAMMO: NEGATIVE

## 2013-12-22 ENCOUNTER — Encounter: Payer: Self-pay | Admitting: Internal Medicine

## 2013-12-22 ENCOUNTER — Ambulatory Visit (INDEPENDENT_AMBULATORY_CARE_PROVIDER_SITE_OTHER): Payer: BC Managed Care – PPO | Admitting: Internal Medicine

## 2013-12-22 VITALS — BP 131/85 | HR 72 | Temp 98.5°F | Ht 67.0 in | Wt 259.2 lb

## 2013-12-22 DIAGNOSIS — Z1211 Encounter for screening for malignant neoplasm of colon: Secondary | ICD-10-CM

## 2013-12-22 DIAGNOSIS — Z0000 Encounter for general adult medical examination without abnormal findings: Secondary | ICD-10-CM

## 2013-12-22 DIAGNOSIS — E669 Obesity, unspecified: Secondary | ICD-10-CM

## 2013-12-22 DIAGNOSIS — I10 Essential (primary) hypertension: Secondary | ICD-10-CM

## 2013-12-22 LAB — COMPREHENSIVE METABOLIC PANEL
ALBUMIN: 4 g/dL (ref 3.5–5.2)
ALT: 13 U/L (ref 0–35)
AST: 18 U/L (ref 0–37)
Alkaline Phosphatase: 81 U/L (ref 39–117)
BUN: 10 mg/dL (ref 6–23)
CALCIUM: 9.6 mg/dL (ref 8.4–10.5)
CO2: 26 mEq/L (ref 19–32)
Chloride: 105 mEq/L (ref 96–112)
Creatinine, Ser: 0.9 mg/dL (ref 0.4–1.2)
GFR: 80.49 mL/min (ref >60.00)
GLUCOSE: 120 mg/dL — AB (ref 70–99)
POTASSIUM: 3.8 meq/L (ref 3.5–5.1)
Sodium: 139 mEq/L (ref 135–145)
Total Bilirubin: 0.6 mg/dL (ref 0.2–1.2)
Total Protein: 7.3 g/dL (ref 6.0–8.3)

## 2013-12-22 LAB — LIPID PANEL
CHOL/HDL RATIO: 4
CHOLESTEROL: 182 mg/dL (ref 0–200)
HDL: 42.7 mg/dL (ref >39.00)
LDL Cholesterol: 119 mg/dL — ABNORMAL HIGH (ref 0–99)
NonHDL: 139.3
Triglycerides: 101 mg/dL (ref 0.0–149.0)
VLDL: 20.2 mg/dL (ref 0.0–40.0)

## 2013-12-22 LAB — CBC WITH DIFFERENTIAL/PLATELET
BASOS PCT: 0.4 % (ref 0.0–3.0)
Basophils Absolute: 0 10*3/uL (ref 0.0–0.1)
EOS PCT: 1.1 % (ref 0.0–5.0)
Eosinophils Absolute: 0.1 10*3/uL (ref 0.0–0.7)
HCT: 38.6 % (ref 36.0–46.0)
Hemoglobin: 12.4 g/dL (ref 12.0–15.0)
LYMPHS PCT: 30.3 % (ref 12.0–46.0)
Lymphs Abs: 1.9 10*3/uL (ref 0.7–4.0)
MCHC: 32.1 g/dL (ref 30.0–36.0)
MCV: 84.1 fl (ref 78.0–100.0)
Monocytes Absolute: 0.7 10*3/uL (ref 0.1–1.0)
Monocytes Relative: 12.1 % — ABNORMAL HIGH (ref 3.0–12.0)
NEUTROS PCT: 56.1 % (ref 43.0–77.0)
Neutro Abs: 3.5 10*3/uL (ref 1.4–7.7)
Platelets: 169 10*3/uL (ref 150.0–400.0)
RBC: 4.59 Mil/uL (ref 3.87–5.11)
RDW: 14.4 % (ref 11.5–15.5)
WBC: 6.2 10*3/uL (ref 4.0–10.5)

## 2013-12-22 LAB — MICROALBUMIN / CREATININE URINE RATIO
CREATININE, U: 102.1 mg/dL
MICROALB/CREAT RATIO: 5.8 mg/g (ref 0.0–30.0)
Microalb, Ur: 5.9 mg/dL — ABNORMAL HIGH (ref 0.0–1.9)

## 2013-12-22 LAB — HEMOGLOBIN A1C: Hgb A1c MFr Bld: 6.3 % (ref 4.6–6.5)

## 2013-12-22 MED ORDER — LOSARTAN POTASSIUM 50 MG PO TABS: 50.0000 mg | ORAL_TABLET | Freq: Every day | ORAL | Status: DC

## 2013-12-22 NOTE — Assessment & Plan Note (Signed)
General medical exam including breast exam normal today. PAP and pelvic deferred as pt s/p hysterectomy and last PAP 2012 normal, HPV neg. Mammogram UTD. Colonoscopy ordered. Immunizations UTD. Encouraged healthy diet and exercise. Labs today including CBC, CMP, lipids, A1c, TSH, Vit D.

## 2013-12-22 NOTE — Patient Instructions (Signed)

## 2013-12-22 NOTE — Progress Notes (Signed)
Pre visit review using our clinic review tool, if applicable. No additional management support is needed unless otherwise documented below in the visit note. 

## 2013-12-22 NOTE — Assessment & Plan Note (Signed)
Wt Readings from Last 3 Encounters:  12/22/13 259 lb 4 oz (117.595 kg)  06/18/13 263 lb 12 oz (119.636 kg)  03/10/13 265 lb (120.203 kg)   Body mass index is 40.59 kg/(m^2). The patient is asked to make an attempt to improve diet and exercise patterns to aid in medical management of this problem.

## 2013-12-22 NOTE — Progress Notes (Signed)
Subjective:    Patient ID: Jennifer Huff, female    DOB: [birth year], 63 y.o.   MRN: [id number]  HPI 63YO female presents for annual exam.  Feeling well. No concerns today. Trying to follow a healthy diet and stay active. No recent changes to medical history. Last PAP 2012 normal, but no cervical component, pt s/p hysterectomy. Last mammogram 06/2013 normal.  Wt Readings from Last 3 Encounters:  12/22/13 259 lb 4 oz (117.595 kg)  06/18/13 263 lb 12 oz (119.636 kg)  03/10/13 265 lb (120.203 kg)     Past medical, surgical, family and social history per today's encounter.  Review of Systems  Constitutional: Negative for fever, chills, appetite change, fatigue and unexpected weight change.  Eyes: Negative for visual disturbance.  Respiratory: Negative for shortness of breath.   Cardiovascular: Negative for chest pain and leg swelling.  Gastrointestinal: Negative for nausea, vomiting, abdominal pain, diarrhea and constipation.  Musculoskeletal: Negative for myalgias and arthralgias.  Skin: Negative for color change and rash.  Hematological: Negative for adenopathy. Does not bruise/bleed easily.  Psychiatric/Behavioral: Negative for sleep disturbance and dysphoric mood. The patient is not nervous/anxious.        Objective:    BP 131/85 mmHg  Pulse 72  Temp(Src) 98.5 F (36.9 C) (Oral)  Ht 5\' 7"  (1.702 m)  Wt 259 lb 4 oz (117.595 kg)  BMI 40.59 kg/m2  SpO2 97% Physical Exam  Constitutional: She is oriented to person, place, and time. She appears well-developed and well-nourished. No distress.  HENT:  Head: Normocephalic and atraumatic.  Right Ear: External ear normal.  Left Ear: External ear normal.  Nose: Nose normal.  Mouth/Throat: Oropharynx is clear and moist. No oropharyngeal exudate.  Eyes: Conjunctivae are normal. Pupils are equal, round, and reactive to light. Right eye exhibits no discharge. Left eye exhibits no discharge. No scleral icterus.  Neck:  Normal range of motion. Neck supple. No tracheal deviation present. No thyromegaly present.  Cardiovascular: Normal rate, regular rhythm, normal heart sounds and intact distal pulses.  Exam reveals no gallop and no friction rub.   No murmur heard. Pulmonary/Chest: Effort normal and breath sounds normal. No accessory muscle usage. No tachypnea. No respiratory distress. She has no decreased breath sounds. She has no wheezes. She has no rales. She exhibits no tenderness. Right breast exhibits no inverted nipple, no mass, no nipple discharge, no skin change and no tenderness. Left breast exhibits no inverted nipple, no mass, no nipple discharge, no skin change and no tenderness. Breasts are symmetrical.  Abdominal: Soft. Bowel sounds are normal. She exhibits no distension and no mass. There is no tenderness. There is no rebound and no guarding.  Musculoskeletal: Normal range of motion. She exhibits no edema or tenderness.  Lymphadenopathy:    She has no cervical adenopathy.  Neurological: She is alert and oriented to person, place, and time. No cranial nerve deficit. She exhibits normal muscle tone. Coordination normal.  Skin: Skin is warm and dry. No rash noted. She is not diaphoretic. No erythema. No pallor.  Psychiatric: She has a normal mood and affect. Her behavior is normal. Judgment and thought content normal.          Assessment & Plan:   Problem List Items Addressed This Visit      Unprioritized   Hypertension    BP Readings from Last 3 Encounters:  12/22/13 131/85  06/18/13 120/82  03/10/13 122/78   BP generally has been well controlled. Renal function with labs  today. Continue Losartan and Amlodipine.    Relevant Medications      losartan (COZAAR) tablet   Obesity    Wt Readings from Last 3 Encounters:  12/22/13 259 lb 4 oz (117.595 kg)  06/18/13 263 lb 12 oz (119.636 kg)  03/10/13 265 lb (120.203 kg)   Body mass index is 40.59 kg/(m^2). The patient is asked to make an  attempt to improve diet and exercise patterns to aid in medical management of this problem.     Routine general medical examination at a health care facility - Primary    General medical exam including breast exam normal today. PAP and pelvic deferred as pt s/p hysterectomy and last PAP 2012 normal, HPV neg. Mammogram UTD. Colonoscopy ordered. Immunizations UTD. Encouraged healthy diet and exercise. Labs today including CBC, CMP, lipids, A1c, TSH, Vit D.    Relevant Orders      TSH      Hemoglobin A1c      CBC with Differential      Comprehensive metabolic panel      Lipid panel      Microalbumin / creatinine urine ratio      Vit D  25 hydroxy (rtn osteoporosis monitoring)    Other Visit Diagnoses    Screening for colon cancer        Relevant Orders       Ambulatory referral to Gastroenterology        Return in about 6 months (around 06/23/2014) for Recheck.

## 2013-12-22 NOTE — Assessment & Plan Note (Signed)
BP Readings from Last 3 Encounters:  12/22/13 131/85  06/18/13 120/82  03/10/13 122/78   BP generally has been well controlled. Renal function with labs today. Continue Losartan and Amlodipine.

## 2013-12-23 LAB — VITAMIN D 25 HYDROXY (VIT D DEFICIENCY, FRACTURES): VITD: 23.94 ng/mL — ABNORMAL LOW (ref 30.00–100.00)

## 2013-12-23 LAB — TSH: TSH: 3.84 u[IU]/mL (ref 0.35–4.50)

## 2014-02-06 ENCOUNTER — Ambulatory Visit: Payer: Self-pay | Admitting: Gastroenterology

## 2014-02-06 LAB — HM COLONOSCOPY

## 2014-03-09 ENCOUNTER — Encounter: Payer: Self-pay | Admitting: Internal Medicine

## 2014-04-27 LAB — SURGICAL PATHOLOGY

## 2014-06-23 ENCOUNTER — Ambulatory Visit (INDEPENDENT_AMBULATORY_CARE_PROVIDER_SITE_OTHER): Payer: BC Managed Care – PPO | Admitting: Internal Medicine

## 2014-06-23 ENCOUNTER — Encounter: Payer: Self-pay | Admitting: Internal Medicine

## 2014-06-23 VITALS — BP 136/82 | HR 67 | Temp 98.0°F | Ht 67.0 in | Wt 263.2 lb

## 2014-06-23 DIAGNOSIS — J309 Allergic rhinitis, unspecified: Secondary | ICD-10-CM | POA: Diagnosis not present

## 2014-06-23 DIAGNOSIS — E669 Obesity, unspecified: Secondary | ICD-10-CM | POA: Diagnosis not present

## 2014-06-23 DIAGNOSIS — I10 Essential (primary) hypertension: Secondary | ICD-10-CM

## 2014-06-23 DIAGNOSIS — Z1239 Encounter for other screening for malignant neoplasm of breast: Secondary | ICD-10-CM

## 2014-06-23 LAB — COMPREHENSIVE METABOLIC PANEL
ALT: 11 U/L (ref 0–35)
AST: 17 U/L (ref 0–37)
Albumin: 3.9 g/dL (ref 3.5–5.2)
Alkaline Phosphatase: 81 U/L (ref 39–117)
BILIRUBIN TOTAL: 0.7 mg/dL (ref 0.2–1.2)
BUN: 10 mg/dL (ref 6–23)
CALCIUM: 9.9 mg/dL (ref 8.4–10.5)
CHLORIDE: 107 meq/L (ref 96–112)
CO2: 28 meq/L (ref 19–32)
CREATININE: 1.02 mg/dL (ref 0.40–1.20)
GFR: 70.44 mL/min (ref >60.00)
GLUCOSE: 123 mg/dL — AB (ref 70–99)
Potassium: 4 mEq/L (ref 3.5–5.1)
Sodium: 139 mEq/L (ref 135–145)
Total Protein: 7.2 g/dL (ref 6.0–8.3)

## 2014-06-23 LAB — HEMOGLOBIN A1C: Hgb A1c MFr Bld: 6 % (ref 4.6–6.5)

## 2014-06-23 MED ORDER — AMLODIPINE BESYLATE 5 MG PO TABS: 5.0000 mg | ORAL_TABLET | Freq: Every day | ORAL | Status: DC

## 2014-06-23 NOTE — Progress Notes (Signed)
Pre visit review using our clinic review tool, if applicable. No additional management support is needed unless otherwise documented below in the visit note. 

## 2014-06-23 NOTE — Assessment & Plan Note (Signed)
Wt Readings from Last 3 Encounters:  06/23/14 263 lb 4 oz (119.409 kg)  12/22/13 259 lb 4 oz (117.595 kg)  06/18/13 263 lb 12 oz (119.636 kg)   Body mass index is 41.22 kg/(m^2).  The patient is asked to make an attempt to improve diet and exercise patterns to aid in medical management of this problem.

## 2014-06-23 NOTE — Patient Instructions (Signed)
Labs today.  We will set up mammogram.

## 2014-06-23 NOTE — Assessment & Plan Note (Signed)
Mammogram ordered

## 2014-06-23 NOTE — Assessment & Plan Note (Signed)
BP Readings from Last 3 Encounters:  06/23/14 136/82  12/22/13 131/85  06/18/13 120/82   BP well controlled. Continue Amlodipine and Losartan. Renal function with labs.

## 2014-06-23 NOTE — Assessment & Plan Note (Signed)
Allergic rhinitis with PND. Recommended adding OTC Claritin or Zyrtec. If no improvement, she will call and RTC.

## 2014-06-23 NOTE — Progress Notes (Signed)
Subjective:    Patient ID: Jennifer Huff, female    DOB: 1950/11/30, 63 y.o.   MRN: [id number]  HPI  64YO female presents for follow up.  Generally feeling well. Some recent post nasal drip. No fever, chills. Occasional dry cough. Not taking anything for this. No dyspnea.  HTN - Compliant with medications. No CP, palpitations, HA.  Wt Readings from Last 3 Encounters:  06/23/14 263 lb 4 oz (119.409 kg)  12/22/13 259 lb 4 oz (117.595 kg)  06/18/13 263 lb 12 oz (119.636 kg)     BP Readings from Last 3 Encounters:  06/23/14 136/82  12/22/13 131/85  06/18/13 120/82     Past medical, surgical, family and social history per today's encounter.  Review of Systems  Constitutional: Negative for fever, chills, appetite change, fatigue and unexpected weight change.  HENT: Positive for congestion, postnasal drip and rhinorrhea. Negative for sinus pressure, sneezing, sore throat, trouble swallowing and voice change.   Eyes: Negative for visual disturbance.  Respiratory: Positive for cough. Negative for chest tightness, shortness of breath and wheezing.   Cardiovascular: Negative for chest pain and leg swelling.  Gastrointestinal: Negative for abdominal pain.  Skin: Negative for color change and rash.  Hematological: Negative for adenopathy. Does not bruise/bleed easily.  Psychiatric/Behavioral: Negative for sleep disturbance and dysphoric mood. The patient is not nervous/anxious.        Objective:    BP 136/82 mmHg  Pulse 67  Temp(Src) 98 F (36.7 C) (Oral)  Ht 5\' 7"  (1.702 m)  Wt 263 lb 4 oz (119.409 kg)  BMI 41.22 kg/m2  SpO2 97% Physical Exam  Constitutional: She is oriented to person, place, and time. She appears well-developed and well-nourished. No distress.  HENT:  Head: Normocephalic and atraumatic.  Right Ear: Tympanic membrane and external ear normal.  Left Ear: Tympanic membrane and external ear normal.  Nose: Nose normal.  Mouth/Throat: Oropharynx is  clear and moist. No oropharyngeal exudate.  Eyes: Conjunctivae are normal. Pupils are equal, round, and reactive to light. Right eye exhibits no discharge. Left eye exhibits no discharge. No scleral icterus.  Neck: Normal range of motion. Neck supple. No tracheal deviation present. No thyromegaly present.  Cardiovascular: Normal rate, regular rhythm, normal heart sounds and intact distal pulses.  Exam reveals no gallop and no friction rub.   No murmur heard. Pulmonary/Chest: Effort normal and breath sounds normal. No respiratory distress. She has no wheezes. She has no rales. She exhibits no tenderness.  Musculoskeletal: Normal range of motion. She exhibits no edema or tenderness.  Lymphadenopathy:    She has no cervical adenopathy.  Neurological: She is alert and oriented to person, place, and time. No cranial nerve deficit. She exhibits normal muscle tone. Coordination normal.  Skin: Skin is warm and dry. No rash noted. She is not diaphoretic. No erythema. No pallor.  Psychiatric: She has a normal mood and affect. Her behavior is normal. Judgment and thought content normal.          Assessment & Plan:   Problem List Items Addressed This Visit      Unprioritized   Allergic rhinitis    Allergic rhinitis with PND. Recommended adding OTC Claritin or Zyrtec. If no improvement, she will call and RTC.      Hypertension - Primary    BP Readings from Last 3 Encounters:  06/23/14 136/82  12/22/13 131/85  06/18/13 120/82   BP well controlled. Continue Amlodipine and Losartan. Renal function with labs.  Relevant Medications   amLODipine (NORVASC) 5 MG tablet   Other Relevant Orders   Comprehensive metabolic panel   Obesity    Wt Readings from Last 3 Encounters:  06/23/14 263 lb 4 oz (119.409 kg)  12/22/13 259 lb 4 oz (117.595 kg)  06/18/13 263 lb 12 oz (119.636 kg)   Body mass index is 41.22 kg/(m^2).  The patient is asked to make an attempt to improve diet and exercise  patterns to aid in medical management of this problem.       Relevant Orders   Hemoglobin A1c   Screening for breast cancer    Mammogram ordered.      Relevant Orders   MM Digital Screening       Return in about 6 months (around 12/23/2014) for Physical.

## 2014-08-03 ENCOUNTER — Ambulatory Visit
Admission: RE | Admit: 2014-08-03 | Discharge: 2014-08-03 | Disposition: A | Discharge status: Home / Self Care | Payer: BC Managed Care – PPO | Source: Ambulatory Visit | Attending: Internal Medicine | Admitting: Internal Medicine

## 2014-08-03 DIAGNOSIS — Z1239 Encounter for other screening for malignant neoplasm of breast: Secondary | ICD-10-CM

## 2014-08-03 DIAGNOSIS — Z1231 Encounter for screening mammogram for malignant neoplasm of breast: Secondary | ICD-10-CM | POA: Diagnosis present

## 2014-08-04 LAB — HM MAMMOGRAPHY

## 2014-12-23 ENCOUNTER — Encounter: Payer: Self-pay | Admitting: Internal Medicine

## 2014-12-23 ENCOUNTER — Ambulatory Visit (INDEPENDENT_AMBULATORY_CARE_PROVIDER_SITE_OTHER): Payer: BC Managed Care – PPO | Admitting: Internal Medicine

## 2014-12-23 VITALS — BP 134/80 | HR 69 | Temp 98.0°F | Ht 65.9 in | Wt 268.1 lb

## 2014-12-23 DIAGNOSIS — Z780 Asymptomatic menopausal state: Secondary | ICD-10-CM | POA: Insufficient documentation

## 2014-12-23 DIAGNOSIS — I10 Essential (primary) hypertension: Secondary | ICD-10-CM

## 2014-12-23 DIAGNOSIS — Z0000 Encounter for general adult medical examination without abnormal findings: Secondary | ICD-10-CM | POA: Diagnosis not present

## 2014-12-23 DIAGNOSIS — K649 Unspecified hemorrhoids: Secondary | ICD-10-CM | POA: Insufficient documentation

## 2014-12-23 LAB — CBC WITH DIFFERENTIAL/PLATELET
BASOS PCT: 0.3 % (ref 0.0–3.0)
Basophils Absolute: 0 10*3/uL (ref 0.0–0.1)
EOS ABS: 0.1 10*3/uL (ref 0.0–0.7)
EOS PCT: 1.7 % (ref 0.0–5.0)
HCT: 36.5 % (ref 36.0–46.0)
Hemoglobin: 11.8 g/dL — ABNORMAL LOW (ref 12.0–15.0)
LYMPHS ABS: 1.8 10*3/uL (ref 0.7–4.0)
Lymphocytes Relative: 31.1 % (ref 12.0–46.0)
MCHC: 32.4 g/dL (ref 30.0–36.0)
MCV: 83.4 fl (ref 78.0–100.0)
MONO ABS: 0.7 10*3/uL (ref 0.1–1.0)
Monocytes Relative: 12.5 % — ABNORMAL HIGH (ref 3.0–12.0)
NEUTROS ABS: 3.1 10*3/uL (ref 1.4–7.7)
Neutrophils Relative %: 54.4 % (ref 43.0–77.0)
PLATELETS: 182 10*3/uL (ref 150.0–400.0)
RBC: 4.38 Mil/uL (ref 3.87–5.11)
RDW: 14.9 % (ref 11.5–15.5)
WBC: 5.8 10*3/uL (ref 4.0–10.5)

## 2014-12-23 LAB — COMPREHENSIVE METABOLIC PANEL
ALBUMIN: 3.9 g/dL (ref 3.5–5.2)
ALT: 10 U/L (ref 0–35)
AST: 14 U/L (ref 0–37)
Alkaline Phosphatase: 86 U/L (ref 39–117)
BUN: 15 mg/dL (ref 6–23)
CO2: 24 meq/L (ref 19–32)
CREATININE: 1.11 mg/dL (ref 0.40–1.20)
Calcium: 9.7 mg/dL (ref 8.4–10.5)
Chloride: 109 mEq/L (ref 96–112)
GFR: 63.79 mL/min (ref >60.00)
Glucose, Bld: 123 mg/dL — ABNORMAL HIGH (ref 70–99)
Potassium: 3.8 mEq/L (ref 3.5–5.1)
SODIUM: 141 meq/L (ref 135–145)
Total Bilirubin: 0.5 mg/dL (ref 0.2–1.2)
Total Protein: 7 g/dL (ref 6.0–8.3)

## 2014-12-23 LAB — LIPID PANEL
CHOL/HDL RATIO: 4
CHOLESTEROL: 173 mg/dL (ref 0–200)
HDL: 46.6 mg/dL (ref >39.00)
LDL CALC: 112 mg/dL — AB (ref 0–99)
NonHDL: 126.64
TRIGLYCERIDES: 71 mg/dL (ref 0.0–149.0)
VLDL: 14.2 mg/dL (ref 0.0–40.0)

## 2014-12-23 LAB — HEMOGLOBIN A1C: Hgb A1c MFr Bld: 6.4 % (ref 4.6–6.5)

## 2014-12-23 LAB — VITAMIN D 25 HYDROXY (VIT D DEFICIENCY, FRACTURES): VITD: 19.15 ng/mL — AB (ref 30.00–100.00)

## 2014-12-23 LAB — TSH: TSH: 1.65 u[IU]/mL (ref 0.35–4.50)

## 2014-12-23 MED ORDER — LOSARTAN POTASSIUM 50 MG PO TABS: 50.0000 mg | ORAL_TABLET | Freq: Every day | ORAL | Status: DC

## 2014-12-23 MED ORDER — AMLODIPINE BESYLATE 5 MG PO TABS: 5.0000 mg | ORAL_TABLET | Freq: Every day | ORAL | Status: DC

## 2014-12-23 NOTE — Assessment & Plan Note (Signed)
Hemorrhoidal tags noted on exam. Encouraged increased fiber in diet, use of pre-moistened toilet paper. Preparation H. Follow up prn.

## 2014-12-23 NOTE — Assessment & Plan Note (Signed)
Bone density testing ordered. 

## 2014-12-23 NOTE — Assessment & Plan Note (Signed)
General medical exam normal today including breast exam. PAP and pelvic deferred as s/p hysterectomy.  Mammogram UTD. Colonoscopy UTD. Immunizations UTD. Labs today.

## 2014-12-23 NOTE — Assessment & Plan Note (Signed)
BP Readings from Last 3 Encounters:  12/23/14 134/80  06/23/14 136/82  12/22/13 131/85   BP well controlled. Renal function with labs. Continue current medication.

## 2014-12-23 NOTE — Progress Notes (Signed)
Pre visit review using our clinic review tool, if applicable. No additional management support is needed unless otherwise documented below in the visit note. 

## 2014-12-23 NOTE — Patient Instructions (Signed)
Health Maintenance, Female Adopting a healthy lifestyle and getting preventive care can go a long way to promote health and wellness. Talk with your health care provider about what schedule of regular examinations is right for you. This is a good chance for you to check in with your provider about disease prevention and staying healthy. In between checkups, there are plenty of things you can do on your own. Experts have done a lot of research about which lifestyle changes and preventive measures are most likely to keep you healthy. Ask your health care provider for more information. WEIGHT AND DIET  Eat a healthy diet  Be sure to include plenty of vegetables, fruits, low-fat dairy products, and lean protein.  Do not eat a lot of foods high in solid fats, added sugars, or salt.  Get regular exercise. This is one of the most important things you can do for your health.  Most adults should exercise for at least 150 minutes each week. The exercise should increase your heart rate and make you sweat (moderate-intensity exercise).  Most adults should also do strengthening exercises at least twice a week. This is in addition to the moderate-intensity exercise.  Maintain a healthy weight  Body mass index (BMI) is a measurement that can be used to identify possible weight problems. It estimates body fat based on height and weight. Your health care provider can help determine your BMI and help you achieve or maintain a healthy weight.  For females 20 years of age and older:   A BMI below 18.5 is considered underweight.  A BMI of 18.5 to 24.9 is normal.  A BMI of 25 to 29.9 is considered overweight.  A BMI of 30 and above is considered obese.  Watch levels of cholesterol and blood lipids  You should start having your blood tested for lipids and cholesterol at 63 years of age, then have this test every 5 years.  You may need to have your cholesterol levels checked more often if:  Your lipid  or cholesterol levels are high.  You are older than 63 years of age.  You are at high risk for heart disease.  CANCER SCREENING   Lung Cancer  Lung cancer screening is recommended for adults 55-80 years old who are at high risk for lung cancer because of a history of smoking.  A yearly low-dose CT scan of the lungs is recommended for people who:  Currently smoke.  Have quit within the past 15 years.  Have at least a 30-pack-year history of smoking. A pack year is smoking an average of one pack of cigarettes a day for 1 year.  Yearly screening should continue until it has been 15 years since you quit.  Yearly screening should stop if you develop a health problem that would prevent you from having lung cancer treatment.  Breast Cancer  Practice breast self-awareness. This means understanding how your breasts normally appear and feel.  It also means doing regular breast self-exams. Let your health care provider know about any changes, no matter how small.  If you are in your 20s or 30s, you should have a clinical breast exam (CBE) by a health care provider every 1-3 years as part of a regular health exam.  If you are 40 or older, have a CBE every year. Also consider having a breast X-ray (mammogram) every year.  If you have a family history of breast cancer, talk to your health care provider about genetic screening.  If you   are at high risk for breast cancer, talk to your health care provider about having an MRI and a mammogram every year.  Breast cancer gene (BRCA) assessment is recommended for women who have family members with BRCA-related cancers. BRCA-related cancers include:  Breast.  Ovarian.  Tubal.  Peritoneal cancers.  Results of the assessment will determine the need for genetic counseling and BRCA1 and BRCA2 testing. Cervical Cancer Your health care provider may recommend that you be screened regularly for cancer of the pelvic organs (ovaries, uterus, and  vagina). This screening involves a pelvic examination, including checking for microscopic changes to the surface of your cervix (Pap test). You may be encouraged to have this screening done every 3 years, beginning at age 21.  For women ages 30-65, health care providers may recommend pelvic exams and Pap testing every 3 years, or they may recommend the Pap and pelvic exam, combined with testing for human papilloma virus (HPV), every 5 years. Some types of HPV increase your risk of cervical cancer. Testing for HPV may also be done on women of any age with unclear Pap test results.  Other health care providers may not recommend any screening for nonpregnant women who are considered low risk for pelvic cancer and who do not have symptoms. Ask your health care provider if a screening pelvic exam is right for you.  If you have had past treatment for cervical cancer or a condition that could lead to cancer, you need Pap tests and screening for cancer for at least 20 years after your treatment. If Pap tests have been discontinued, your risk factors (such as having a new sexual partner) need to be reassessed to determine if screening should resume. Some women have medical problems that increase the chance of getting cervical cancer. In these cases, your health care provider may recommend more frequent screening and Pap tests. Colorectal Cancer  This type of cancer can be detected and often prevented.  Routine colorectal cancer screening usually begins at 63 years of age and continues through 63 years of age.  Your health care provider may recommend screening at an earlier age if you have risk factors for colon cancer.  Your health care provider may also recommend using home test kits to check for hidden blood in the stool.  A small camera at the end of a tube can be used to examine your colon directly (sigmoidoscopy or colonoscopy). This is done to check for the earliest forms of colorectal  cancer.  Routine screening usually begins at age 50.  Direct examination of the colon should be repeated every 5-10 years through 63 years of age. However, you may need to be screened more often if early forms of precancerous polyps or small growths are found. Skin Cancer  Check your skin from head to toe regularly.  Tell your health care provider about any new moles or changes in moles, especially if there is a change in a mole's shape or color.  Also tell your health care provider if you have a mole that is larger than the size of a pencil eraser.  Always use sunscreen. Apply sunscreen liberally and repeatedly throughout the day.  Protect yourself by wearing long sleeves, pants, a wide-brimmed hat, and sunglasses whenever you are outside. HEART DISEASE, DIABETES, AND HIGH BLOOD PRESSURE   High blood pressure causes heart disease and increases the risk of stroke. High blood pressure is more likely to develop in:  People who have blood pressure in the high end   of the normal range (130-139/85-89 mm Hg).  People who are overweight or obese.  People who are African American.  If you are 38-23 years of age, have your blood pressure checked every 3-5 years. If you are 61 years of age or older, have your blood pressure checked every year. You should have your blood pressure measured twice--once when you are at a hospital or clinic, and once when you are not at a hospital or clinic. Record the average of the two measurements. To check your blood pressure when you are not at a hospital or clinic, you can use:  An automated blood pressure machine at a pharmacy.  A home blood pressure monitor.  If you are between 45 years and 39 years old, ask your health care provider if you should take aspirin to prevent strokes.  Have regular diabetes screenings. This involves taking a blood sample to check your fasting blood sugar level.  If you are at a normal weight and have a low risk for diabetes,  have this test once every three years after 63 years of age.  If you are overweight and have a high risk for diabetes, consider being tested at a younger age or more often. PREVENTING INFECTION  Hepatitis B  If you have a higher risk for hepatitis B, you should be screened for this virus. You are considered at high risk for hepatitis B if:  You were born in a country where hepatitis B is common. Ask your health care provider which countries are considered high risk.  Your parents were born in a high-risk country, and you have not been immunized against hepatitis B (hepatitis B vaccine).  You have HIV or AIDS.  You use needles to inject street drugs.  You live with someone who has hepatitis B.  You have had sex with someone who has hepatitis B.  You get hemodialysis treatment.  You take certain medicines for conditions, including cancer, organ transplantation, and autoimmune conditions. Hepatitis C  Blood testing is recommended for:  Everyone born from [birth year] through 1964.  Anyone with known risk factors for hepatitis C. Sexually transmitted infections (STIs)  You should be screened for sexually transmitted infections (STIs) including gonorrhea and chlamydia if:  You are sexually active and are younger than 63 years of age.  You are older than 63 years of age and your health care provider tells you that you are at risk for this type of infection.  Your sexual activity has changed since you were last screened and you are at an increased risk for chlamydia or gonorrhea. Ask your health care provider if you are at risk.  If you do not have HIV, but are at risk, it may be recommended that you take a prescription medicine daily to prevent HIV infection. This is called pre-exposure prophylaxis (PrEP). You are considered at risk if:  You are sexually active and do not regularly use condoms or know the HIV status of your partner(s).  You take drugs by injection.  You are sexually  active with a partner who has HIV. Talk with your health care provider about whether you are at high risk of being infected with HIV. If you choose to begin PrEP, you should first be tested for HIV. You should then be tested every 3 months for as long as you are taking PrEP.  PREGNANCY   If you are premenopausal and you may become pregnant, ask your health care provider about preconception counseling.  If you may  become pregnant, take 400 to 800 micrograms (mcg) of folic acid every day.  If you want to prevent pregnancy, talk to your health care provider about birth control (contraception). OSTEOPOROSIS AND MENOPAUSE   Osteoporosis is a disease in which the bones lose minerals and strength with aging. This can result in serious bone fractures. Your risk for osteoporosis can be identified using a bone density scan.  If you are 61 years of age or older, or if you are at risk for osteoporosis and fractures, ask your health care provider if you should be screened.  Ask your health care provider whether you should take a calcium or vitamin D supplement to lower your risk for osteoporosis.  Menopause may have certain physical symptoms and risks.  Hormone replacement therapy may reduce some of these symptoms and risks. Talk to your health care provider about whether hormone replacement therapy is right for you.  HOME CARE INSTRUCTIONS   Schedule regular health, dental, and eye exams.  Stay current with your immunizations.   Do not use any tobacco products including cigarettes, chewing tobacco, or electronic cigarettes.  If you are pregnant, do not drink alcohol.  If you are breastfeeding, limit how much and how often you drink alcohol.  Limit alcohol intake to no more than 1 drink per day for nonpregnant women. One drink equals 12 ounces of beer, 5 ounces of wine, or 1 ounces of hard liquor.  Do not use street drugs.  Do not share needles.  Ask your health care provider for help if  you need support or information about quitting drugs.  Tell your health care provider if you often feel depressed.  Tell your health care provider if you have ever been abused or do not feel safe at home.   This information is not intended to replace advice given to you by your health care provider. Make sure you discuss any questions you have with your health care provider.   Document Released: 07/04/2010 Document Revised: 01/09/2014 Document Reviewed: 11/20/2012 Elsevier Interactive Patient Education [employer].

## 2014-12-23 NOTE — Progress Notes (Signed)
Subjective:    Patient ID: Jennifer Huff, female    DOB: 1950/10/26, 64 y.o.   MRN: [id number]  HPI  64YO female presents for physical exam.  Last month, had an episode of bright red blood on toilet paper with straining for BM. This occurred only once. BMs now regular and non-bloody. Aside from this, feeling well.  Wt Readings from Last 3 Encounters:  12/23/14 268 lb 2 oz (121.621 kg)  06/23/14 263 lb 4 oz (119.409 kg)  12/22/13 259 lb 4 oz (117.595 kg)   BP Readings from Last 3 Encounters:  12/23/14 134/80  06/23/14 136/82  12/22/13 131/85    Past Medical History  Diagnosis Date  . HTN (hypertension)   . Vitamin D deficiency   . Cataract    Family History  Problem Relation Age of Onset  . Kidney disease Father   . Heart disease Father   . Cancer Father     prostate  . Hypertension Sister   . Colon polyps Sister   . Hypertension Brother   . Colon cancer Neg Hx   . Breast cancer Neg Hx   . Hypertension Sister   . Cancer Paternal Grandmother     head and neck  . Hypertension Brother    Past Surgical History  Procedure Laterality Date  . Laparoscopic hysterectomy     Social History   Social History  . Marital Status: Married    Spouse Name: N/A  . Number of Children: 3  . Years of Education: N/A   Occupational History  . [employer] - Binding specialist    Social History Main Topics  . Smoking status: Former Smoker    Quit date: 12/08/1999  . Smokeless tobacco: Never Used  . Alcohol Use: Yes     Comment: Occasional wine  . Drug Use: No  . Sexual Activity: Not Asked   Other Topics Concern  . None   Social History Narrative   Regular Exercise -  NO   Daily Caffeine Use:  2-3 soda/sw tea    Review of Systems  Constitutional: Negative for fever, chills, appetite change, fatigue and unexpected weight change.  Eyes: Negative for visual disturbance.  Respiratory: Negative for shortness of breath.   Cardiovascular: Negative for chest pain  and leg swelling.  Gastrointestinal: Positive for anal bleeding. Negative for nausea, abdominal pain, diarrhea, constipation and rectal pain.  Musculoskeletal: Negative for myalgias and arthralgias.  Skin: Negative for color change and rash.  Neurological: Negative for weakness.  Hematological: Negative for adenopathy. Does not bruise/bleed easily.  Psychiatric/Behavioral: Negative for sleep disturbance and dysphoric mood. The patient is not nervous/anxious.        Objective:    BP 134/80 mmHg  Pulse 69  Temp(Src) 98 F (36.7 C) (Oral)  Ht 5' 5.9" (1.674 m)  Wt 268 lb 2 oz (121.621 kg)  BMI 43.40 kg/m2  SpO2 98% Physical Exam  Constitutional: She is oriented to person, place, and time. She appears well-developed and well-nourished. No distress.  HENT:  Head: Normocephalic and atraumatic.  Right Ear: External ear normal.  Left Ear: External ear normal.  Nose: Nose normal.  Mouth/Throat: Oropharynx is clear and moist. No oropharyngeal exudate.  Eyes: Conjunctivae are normal. Pupils are equal, round, and reactive to light. Right eye exhibits no discharge. Left eye exhibits no discharge. No scleral icterus.  Neck: Normal range of motion. Neck supple. No tracheal deviation present. No thyromegaly present.  Cardiovascular: Normal rate, regular rhythm, normal heart  sounds and intact distal pulses.  Exam reveals no gallop and no friction rub.   No murmur heard. Pulmonary/Chest: Effort normal and breath sounds normal. No accessory muscle usage. No tachypnea. No respiratory distress. She has no decreased breath sounds. She has no wheezes. She has no rales. She exhibits no tenderness. Right breast exhibits no inverted nipple, no mass, no nipple discharge, no skin change and no tenderness. Left breast exhibits no inverted nipple, no mass, no nipple discharge, no skin change and no tenderness. Breasts are symmetrical.  Abdominal: Soft. Bowel sounds are normal. She exhibits no distension and no  mass. There is no tenderness. There is no rebound and no guarding.  Genitourinary:     Musculoskeletal: Normal range of motion. She exhibits no edema or tenderness.  Lymphadenopathy:    She has no cervical adenopathy.  Neurological: She is alert and oriented to person, place, and time. No cranial nerve deficit. She exhibits normal muscle tone. Coordination normal.  Skin: Skin is warm and dry. No rash noted. She is not diaphoretic. No erythema. No pallor.  Psychiatric: She has a normal mood and affect. Her behavior is normal. Judgment and thought content normal.          Assessment & Plan:   Problem List Items Addressed This Visit      Unprioritized   Hemorrhoid    Hemorrhoidal tags noted on exam. Encouraged increased fiber in diet, use of pre-moistened toilet paper. Preparation H. Follow up prn.      Relevant Medications   losartan (COZAAR) 50 MG tablet   amLODipine (NORVASC) 5 MG tablet   Hypertension    BP Readings from Last 3 Encounters:  12/23/14 134/80  06/23/14 136/82  12/22/13 131/85   BP well controlled. Renal function with labs. Continue current medication.      Relevant Medications   losartan (COZAAR) 50 MG tablet   amLODipine (NORVASC) 5 MG tablet   Morbid obesity (HCC)    Wt Readings from Last 3 Encounters:  12/23/14 268 lb 2 oz (121.621 kg)  06/23/14 263 lb 4 oz (119.409 kg)  12/22/13 259 lb 4 oz (117.595 kg)   Encouraged healthy diet and exercise.      Postmenopausal estrogen deficiency    Bone density testing ordered.      Relevant Orders   DG Bone Density   Routine general medical examination at a health care facility - Primary    General medical exam normal today including breast exam. PAP and pelvic deferred as s/p hysterectomy.  Mammogram UTD. Colonoscopy UTD. Immunizations UTD. Labs today.      Relevant Orders   CBC with Differential/Platelet   Comprehensive metabolic panel   Lipid panel   Hemoglobin A1c   TSH   VITAMIN D 25  Hydroxy (Vit-D Deficiency, Fractures)       Return in about 6 months (around 06/23/2015) for Recheck.

## 2014-12-23 NOTE — Assessment & Plan Note (Signed)
Wt Readings from Last 3 Encounters:  12/23/14 268 lb 2 oz (121.621 kg)  06/23/14 263 lb 4 oz (119.409 kg)  12/22/13 259 lb 4 oz (117.595 kg)   Encouraged healthy diet and exercise.

## 2014-12-25 ENCOUNTER — Encounter: Payer: Self-pay | Admitting: *Deleted

## 2014-12-31 ENCOUNTER — Encounter: Payer: Self-pay | Admitting: Internal Medicine

## 2015-01-12 ENCOUNTER — Telehealth: Payer: Self-pay | Admitting: Internal Medicine

## 2015-01-12 ENCOUNTER — Encounter: Payer: Self-pay | Admitting: *Deleted

## 2015-01-12 NOTE — Telephone Encounter (Signed)
FYI

## 2015-01-12 NOTE — Telephone Encounter (Signed)
Pt called about needing a repeat lab for CBC in four weeks. Need order please and thank you!

## 2015-01-25 ENCOUNTER — Other Ambulatory Visit: Payer: BC Managed Care – PPO

## 2015-01-27 ENCOUNTER — Telehealth: Payer: Self-pay | Admitting: *Deleted

## 2015-01-27 ENCOUNTER — Other Ambulatory Visit (INDEPENDENT_AMBULATORY_CARE_PROVIDER_SITE_OTHER): Payer: BC Managed Care – PPO

## 2015-01-27 ENCOUNTER — Ambulatory Visit
Admission: RE | Admit: 2015-01-27 | Discharge: 2015-01-27 | Disposition: A | Discharge status: Home / Self Care | Payer: BC Managed Care – PPO | Source: Ambulatory Visit | Attending: Internal Medicine | Admitting: Internal Medicine

## 2015-01-27 DIAGNOSIS — Z13820 Encounter for screening for osteoporosis: Secondary | ICD-10-CM | POA: Diagnosis not present

## 2015-01-27 DIAGNOSIS — Z780 Asymptomatic menopausal state: Secondary | ICD-10-CM

## 2015-01-27 DIAGNOSIS — D649 Anemia, unspecified: Secondary | ICD-10-CM

## 2015-01-27 LAB — CBC WITH DIFFERENTIAL/PLATELET
Basophils Absolute: 0 10*3/uL (ref 0.0–0.1)
Basophils Relative: 0.2 % (ref 0.0–3.0)
EOS ABS: 0.1 10*3/uL (ref 0.0–0.7)
EOS PCT: 1.3 % (ref 0.0–5.0)
HEMATOCRIT: 39.4 % (ref 36.0–46.0)
HEMOGLOBIN: 12.8 g/dL (ref 12.0–15.0)
LYMPHS PCT: 26.3 % (ref 12.0–46.0)
Lymphs Abs: 1.9 10*3/uL (ref 0.7–4.0)
MCHC: 32.6 g/dL (ref 30.0–36.0)
MCV: 84 fl (ref 78.0–100.0)
Monocytes Absolute: 1 10*3/uL (ref 0.1–1.0)
Monocytes Relative: 13.2 % — ABNORMAL HIGH (ref 3.0–12.0)
Neutro Abs: 4.3 10*3/uL (ref 1.4–7.7)
Neutrophils Relative %: 59 % (ref 43.0–77.0)
Platelets: 191 10*3/uL (ref 150.0–400.0)
RBC: 4.69 Mil/uL (ref 3.87–5.11)
RDW: 14.7 % (ref 11.5–15.5)
WBC: 7.3 10*3/uL (ref 4.0–10.5)

## 2015-01-27 NOTE — Telephone Encounter (Signed)
CBC for anemia 

## 2015-01-27 NOTE — Telephone Encounter (Signed)
Labs and dx?  

## 2015-05-17 ENCOUNTER — Ambulatory Visit (INDEPENDENT_AMBULATORY_CARE_PROVIDER_SITE_OTHER): Payer: BC Managed Care – PPO | Admitting: Family Medicine

## 2015-05-17 ENCOUNTER — Encounter: Payer: Self-pay | Admitting: Family Medicine

## 2015-05-17 VITALS — BP 144/82 | HR 71 | Temp 98.5°F | Ht 65.9 in | Wt 259.6 lb

## 2015-05-17 DIAGNOSIS — J069 Acute upper respiratory infection, unspecified: Secondary | ICD-10-CM | POA: Diagnosis not present

## 2015-05-17 NOTE — Patient Instructions (Signed)
It was nice to see you today.  This is likely viral.  You're doing well.  If your symptoms worsen or recur take and OTC antihistamine (Claritin, Zyrtec, Allegra, Xyzal) and Flonase.   Follow up:  As needed  Take care  Dr. Lacinda Axon

## 2015-05-17 NOTE — Progress Notes (Signed)
Pre visit review using our clinic review tool, if applicable. No additional management support is needed unless otherwise documented below in the visit note. 

## 2015-05-17 NOTE — Assessment & Plan Note (Signed)
New problem. Improving at this time. Advised supportive care.

## 2015-05-17 NOTE — Progress Notes (Signed)
   Subjective:  Patient ID: Jennifer Huff, female    DOB: [birth year]  Age: 65 y.o. MRN: [id number]  CC:  Sinus pressure, Ear pain  HPI:  65 year old female presents with the above complaints.  Patient states that she developed sinus pressure and ear pain on Thursday. She used some peroxide for her ears and her symptoms have slowly been improving. No other medications tried. No known exacerbating factors. No associated fevers or chills. No other complaints today.  Social Hx   Social History   Social History  . Marital Status: Married    Spouse Name: N/A  . Number of Children: 3  . Years of Education: N/A   Occupational History  . [employer] - Binding specialist    Social History Main Topics  . Smoking status: Former Smoker    Quit date: 12/08/1999  . Smokeless tobacco: Never Used  . Alcohol Use: Yes     Comment: Occasional wine  . Drug Use: No  . Sexual Activity: Not Asked   Other Topics Concern  . None   Social History Narrative   Regular Exercise -  NO   Daily Caffeine Use:  2-3 soda/sw tea   Review of Systems  Constitutional: Negative.   HENT: Positive for ear pain and sinus pressure.    Objective:  BP 144/82 mmHg  Pulse 71  Temp(Src) 98.5 F (36.9 C) (Oral)  Ht 5' 5.9" (1.674 m)  Wt 259 lb 9.6 oz (117.754 kg)  BMI 42.02 kg/m2  SpO2 95%  BP/Weight 05/17/2015 12/23/2014 [id number]  Systolic BP 123456 [id number]  Diastolic BP 82 80 82  Wt. (Lbs) 259.6 268.13 263.25  BMI 42.02 43.4 41.22   Physical Exam  Constitutional: She is oriented to person, place, and time. She appears well-developed. No distress.  HENT:  Head: Normocephalic and atraumatic.  Mouth/Throat: Oropharynx is clear and moist.  Normal TM's bilaterally.  Cardiovascular: Normal rate and regular rhythm.   Pulmonary/Chest: Effort normal. She has no wheezes. She has no rales.  Neurological: She is alert and oriented to person, place, and time.  Psychiatric: She has a normal mood and affect.    Vitals reviewed.  Lab Results  Component Value Date   WBC 7.3 01/27/2015   HGB 12.8 01/27/2015   HCT 39.4 01/27/2015   PLT 191.0 01/27/2015   GLUCOSE 123* 12/23/2014   CHOL 173 12/23/2014   TRIG 71.0 12/23/2014   HDL 46.60 12/23/2014   LDLCALC 112* 12/23/2014   ALT 10 12/23/2014   AST 14 12/23/2014   NA 141 12/23/2014   K 3.8 12/23/2014   CL 109 12/23/2014   CREATININE 1.11 12/23/2014   BUN 15 12/23/2014   CO2 24 12/23/2014   TSH 1.65 12/23/2014   HGBA1C 6.4 12/23/2014   MICROALBUR 5.9* 12/22/2013    Assessment & Plan:   Problem List Items Addressed This Visit    URI (upper respiratory infection) - Primary    New problem. Improving at this time. Advised supportive care.        Follow-up: PRN  [place]

## 2015-06-29 ENCOUNTER — Ambulatory Visit (INDEPENDENT_AMBULATORY_CARE_PROVIDER_SITE_OTHER): Payer: BC Managed Care – PPO | Admitting: Internal Medicine

## 2015-06-29 ENCOUNTER — Telehealth: Payer: Self-pay

## 2015-06-29 ENCOUNTER — Other Ambulatory Visit (INDEPENDENT_AMBULATORY_CARE_PROVIDER_SITE_OTHER): Payer: BC Managed Care – PPO

## 2015-06-29 ENCOUNTER — Encounter: Payer: Self-pay | Admitting: Internal Medicine

## 2015-06-29 VITALS — BP 132/86 | HR 75 | Ht 66.0 in | Wt 263.0 lb

## 2015-06-29 DIAGNOSIS — R7309 Other abnormal glucose: Secondary | ICD-10-CM | POA: Diagnosis not present

## 2015-06-29 DIAGNOSIS — I10 Essential (primary) hypertension: Secondary | ICD-10-CM | POA: Diagnosis not present

## 2015-06-29 DIAGNOSIS — L853 Xerosis cutis: Secondary | ICD-10-CM | POA: Diagnosis not present

## 2015-06-29 DIAGNOSIS — Z1239 Encounter for other screening for malignant neoplasm of breast: Secondary | ICD-10-CM

## 2015-06-29 LAB — COMPREHENSIVE METABOLIC PANEL
ALBUMIN: 4 g/dL (ref 3.5–5.2)
ALK PHOS: 78 U/L (ref 39–117)
ALT: 12 U/L (ref 0–35)
AST: 16 U/L (ref 0–37)
BILIRUBIN TOTAL: 0.8 mg/dL (ref 0.2–1.2)
BUN: 14 mg/dL (ref 6–23)
CO2: 29 mEq/L (ref 19–32)
Calcium: 9.8 mg/dL (ref 8.4–10.5)
Chloride: 109 mEq/L (ref 96–112)
Creatinine, Ser: 1.2 mg/dL (ref 0.40–1.20)
GFR: 58.21 mL/min — ABNORMAL LOW (ref >60.00)
GLUCOSE: 127 mg/dL — AB (ref 70–99)
Potassium: 3.9 mEq/L (ref 3.5–5.1)
SODIUM: 141 meq/L (ref 135–145)
TOTAL PROTEIN: 7.3 g/dL (ref 6.0–8.3)

## 2015-06-29 LAB — MICROALBUMIN / CREATININE URINE RATIO
Creatinine,U: 89.6 mg/dL
Microalb Creat Ratio: 9 mg/g (ref 0.0–30.0)
Microalb, Ur: 8.1 mg/dL — ABNORMAL HIGH (ref 0.0–1.9)

## 2015-06-29 LAB — TSH: TSH: 2.68 u[IU]/mL (ref 0.35–4.50)

## 2015-06-29 LAB — HEMOGLOBIN A1C: Hgb A1c MFr Bld: 6.1 % (ref 4.6–6.5)

## 2015-06-29 MED ORDER — AMLODIPINE BESYLATE 5 MG PO TABS: 5.0000 mg | ORAL_TABLET | Freq: Every day | ORAL | Status: DC

## 2015-06-29 MED ORDER — LOSARTAN POTASSIUM 50 MG PO TABS: 50.0000 mg | ORAL_TABLET | Freq: Every day | ORAL | Status: DC

## 2015-06-29 NOTE — Assessment & Plan Note (Signed)
Mammogram order placed

## 2015-06-29 NOTE — Patient Instructions (Signed)
Try using Vanicream to help with dry skin.  Labs today.

## 2015-06-29 NOTE — Progress Notes (Signed)
Subjective:    Patient ID: Jennifer Huff, female    DOB: [birth year], 65 y.o.   MRN: [id number]  HPI  65YO female presents for follow up.  Feeling well. No concerns today.  HTN - Compliant with medication. No CP, HA. Does not check BP at home.  Notes recent dry skin over arms and legs. Using OTC moisturizer with some improvement.  Wt Readings from Last 3 Encounters:  06/29/15 263 lb (119.296 kg)  05/17/15 259 lb 9.6 oz (117.754 kg)  12/23/14 268 lb 2 oz (121.621 kg)   BP Readings from Last 3 Encounters:  06/29/15 132/86  05/17/15 144/82  12/23/14 134/80    Past Medical History  Diagnosis Date  . HTN (hypertension)   . Vitamin D deficiency   . Cataract    Family History  Problem Relation Age of Onset  . Kidney disease Father   . Heart disease Father   . Cancer Father     prostate  . Hypertension Sister   . Colon polyps Sister   . Hypertension Brother   . Colon cancer Neg Hx   . Breast cancer Neg Hx   . Hypertension Sister   . Cancer Paternal Grandmother     head and neck  . Hypertension Brother    Past Surgical History  Procedure Laterality Date  . Laparoscopic hysterectomy     Social History   Social History  . Marital Status: Married    Spouse Name: N/A  . Number of Children: 3  . Years of Education: N/A   Occupational History  . [employer] - Binding specialist    Social History Main Topics  . Smoking status: Former Smoker    Quit date: 12/08/1999  . Smokeless tobacco: Never Used  . Alcohol Use: Yes     Comment: Occasional wine  . Drug Use: No  . Sexual Activity: Not Asked   Other Topics Concern  . None   Social History Narrative   Regular Exercise -  NO   Daily Caffeine Use:  2-3 soda/sw tea    Review of Systems  Constitutional: Negative for fever, chills, appetite change, fatigue and unexpected weight change.  Eyes: Negative for visual disturbance.  Respiratory: Negative for shortness of breath.   Cardiovascular: Negative  for chest pain, palpitations and leg swelling.  Gastrointestinal: Negative for nausea, abdominal pain, diarrhea and constipation.  Skin: Negative for color change and rash.  Hematological: Negative for adenopathy. Does not bruise/bleed easily.  Psychiatric/Behavioral: Negative for dysphoric mood. The patient is not nervous/anxious.        Objective:    BP 132/86 mmHg  Pulse 75  Ht 5\' 6"  (1.676 m)  Wt 263 lb (119.296 kg)  BMI 42.47 kg/m2  SpO2 97% Physical Exam  Constitutional: She is oriented to person, place, and time. She appears well-developed and well-nourished. No distress.  HENT:  Head: Normocephalic and atraumatic.  Right Ear: External ear normal.  Left Ear: External ear normal.  Nose: Nose normal.  Mouth/Throat: Oropharynx is clear and moist. No oropharyngeal exudate.  Eyes: Conjunctivae are normal. Pupils are equal, round, and reactive to light. Right eye exhibits no discharge. Left eye exhibits no discharge. No scleral icterus.  Neck: Normal range of motion. Neck supple. No tracheal deviation present. No thyromegaly present.  Cardiovascular: Normal rate, regular rhythm, normal heart sounds and intact distal pulses.  Exam reveals no gallop and no friction rub.   No murmur heard. Pulmonary/Chest: Effort normal and breath sounds normal.  No respiratory distress. She has no wheezes. She has no rales. She exhibits no tenderness.  Musculoskeletal: Normal range of motion. She exhibits no edema or tenderness.  Lymphadenopathy:    She has no cervical adenopathy.  Neurological: She is alert and oriented to person, place, and time. No cranial nerve deficit. She exhibits normal muscle tone. Coordination normal.  Skin: Skin is warm and dry. No rash noted. She is not diaphoretic. No erythema. No pallor.  Psychiatric: She has a normal mood and affect. Her behavior is normal. Judgment and thought content normal.          Assessment & Plan:   Problem List Items Addressed This Visit       Unprioritized   Dry skin    Dry skin noted by pt. Exam is normal. Will check thyroid function with labs. Discussed using Vanicream and adding Triamcinolone if symptoms persist.      Relevant Orders   TSH   Hypertension - Primary    BP Readings from Last 3 Encounters:  06/29/15 132/86  05/17/15 144/82  12/23/14 134/80   BP well controlled generally. Renal function with labs. Continue Amlodipine and Losartan.      Relevant Medications   losartan (COZAAR) 50 MG tablet   amLODipine (NORVASC) 5 MG tablet   Other Relevant Orders   Comprehensive metabolic panel   Microalbumin / creatinine urine ratio   Morbid obesity (HCC)    Wt Readings from Last 3 Encounters:  06/29/15 263 lb (119.296 kg)  05/17/15 259 lb 9.6 oz (117.754 kg)  12/23/14 268 lb 2 oz (121.621 kg)   Encouraged healthy diet and exercise with goal of weight loss.      Screening for breast cancer    Mammogram order placed.      Relevant Orders   MM Digital Screening       Return in about 6 months (around 12/29/2015) for Physical.  Ronette Deter, MD [facility]

## 2015-06-29 NOTE — Assessment & Plan Note (Signed)
Dry skin noted by pt. Exam is normal. Will check thyroid function with labs. Discussed using Vanicream and adding Triamcinolone if symptoms persist.

## 2015-06-29 NOTE — Telephone Encounter (Signed)
Spoke to patient, she stated that she will come back for A1C lab today before 1630.  If not she will come in tomorrow morning.

## 2015-06-29 NOTE — Assessment & Plan Note (Signed)
Wt Readings from Last 3 Encounters:  06/29/15 263 lb (119.296 kg)  05/17/15 259 lb 9.6 oz (117.754 kg)  12/23/14 268 lb 2 oz (121.621 kg)   Encouraged healthy diet and exercise with goal of weight loss.

## 2015-06-29 NOTE — Assessment & Plan Note (Signed)
BP Readings from Last 3 Encounters:  06/29/15 132/86  05/17/15 144/82  12/23/14 134/80   BP well controlled generally. Renal function with labs. Continue Amlodipine and Losartan.

## 2015-06-29 NOTE — Progress Notes (Signed)
Pre visit review using our clinic review tool, if applicable. No additional management support is needed unless otherwise documented below in the visit note. 

## 2015-12-29 ENCOUNTER — Encounter: Payer: BC Managed Care – PPO | Admitting: Internal Medicine

## 2015-12-29 ENCOUNTER — Ambulatory Visit (INDEPENDENT_AMBULATORY_CARE_PROVIDER_SITE_OTHER): Payer: BC Managed Care – PPO | Admitting: Family

## 2015-12-29 ENCOUNTER — Encounter: Payer: Self-pay | Admitting: Family

## 2015-12-29 VITALS — BP 140/70 | HR 85 | Temp 98.1°F | Ht 66.0 in | Wt 267.0 lb

## 2015-12-29 DIAGNOSIS — Z0000 Encounter for general adult medical examination without abnormal findings: Secondary | ICD-10-CM | POA: Diagnosis not present

## 2015-12-29 LAB — COMPREHENSIVE METABOLIC PANEL
ALBUMIN: 4.1 g/dL (ref 3.5–5.2)
ALT: 10 U/L (ref 0–35)
AST: 13 U/L (ref 0–37)
Alkaline Phosphatase: 95 U/L (ref 39–117)
BILIRUBIN TOTAL: 0.5 mg/dL (ref 0.2–1.2)
BUN: 14 mg/dL (ref 6–23)
CALCIUM: 9.6 mg/dL (ref 8.4–10.5)
CO2: 28 meq/L (ref 19–32)
Chloride: 108 mEq/L (ref 96–112)
Creatinine, Ser: 1.13 mg/dL (ref 0.40–1.20)
GFR: 62.29 mL/min (ref >60.00)
Glucose, Bld: 131 mg/dL — ABNORMAL HIGH (ref 70–99)
Potassium: 4.7 mEq/L (ref 3.5–5.1)
Sodium: 143 mEq/L (ref 135–145)
Total Protein: 6.8 g/dL (ref 6.0–8.3)

## 2015-12-29 LAB — CBC WITH DIFFERENTIAL/PLATELET
BASOS PCT: 0.5 % (ref 0.0–3.0)
Basophils Absolute: 0 10*3/uL (ref 0.0–0.1)
EOS PCT: 1.5 % (ref 0.0–5.0)
Eosinophils Absolute: 0.1 10*3/uL (ref 0.0–0.7)
HEMATOCRIT: 37 % (ref 36.0–46.0)
HEMOGLOBIN: 12.4 g/dL (ref 12.0–15.0)
LYMPHS PCT: 27.2 % (ref 12.0–46.0)
Lymphs Abs: 1.9 10*3/uL (ref 0.7–4.0)
MCHC: 33.4 g/dL (ref 30.0–36.0)
MCV: 82.8 fl (ref 78.0–100.0)
MONO ABS: 0.9 10*3/uL (ref 0.1–1.0)
Monocytes Relative: 13 % — ABNORMAL HIGH (ref 3.0–12.0)
Neutro Abs: 4.1 10*3/uL (ref 1.4–7.7)
Neutrophils Relative %: 57.8 % (ref 43.0–77.0)
Platelets: 187 10*3/uL (ref 150.0–400.0)
RBC: 4.47 Mil/uL (ref 3.87–5.11)
RDW: 14.5 % (ref 11.5–15.5)
WBC: 7.1 10*3/uL (ref 4.0–10.5)

## 2015-12-29 LAB — VITAMIN D 25 HYDROXY (VIT D DEFICIENCY, FRACTURES): VITD: 23.29 ng/mL — ABNORMAL LOW (ref 30.00–100.00)

## 2015-12-29 LAB — LIPID PANEL
CHOLESTEROL: 175 mg/dL (ref 0–200)
HDL: 52.1 mg/dL (ref >39.00)
LDL CALC: 106 mg/dL — AB (ref 0–99)
NonHDL: 122.84
TRIGLYCERIDES: 83 mg/dL (ref 0.0–149.0)
Total CHOL/HDL Ratio: 3
VLDL: 16.6 mg/dL (ref 0.0–40.0)

## 2015-12-29 LAB — TSH: TSH: 1.94 u[IU]/mL (ref 0.35–4.50)

## 2015-12-29 LAB — HEMOGLOBIN A1C: HEMOGLOBIN A1C: 6.3 % (ref 4.6–6.5)

## 2015-12-29 NOTE — Patient Instructions (Addendum)
Bring colonoscopy and mammogram report by the office.   Health Maintenance, Female Introduction Adopting a healthy lifestyle and getting preventive care can go a long way to promote health and wellness. Talk with your health care provider about what schedule of regular examinations is right for you. This is a good chance for you to check in with your provider about disease prevention and staying healthy. In between checkups, there are plenty of things you can do on your own. Experts have done a lot of research about which lifestyle changes and preventive measures are most likely to keep you healthy. Ask your health care provider for more information. Weight and diet Eat a healthy diet  Be sure to include plenty of vegetables, fruits, low-fat dairy products, and lean protein.  Do not eat a lot of foods high in solid fats, added sugars, or salt.  Get regular exercise. This is one of the most important things you can do for your health.  Most adults should exercise for at least 150 minutes each week. The exercise should increase your heart rate and make you sweat (moderate-intensity exercise).  Most adults should also do strengthening exercises at least twice a week. This is in addition to the moderate-intensity exercise. Maintain a healthy weight  Body mass index (BMI) is a measurement that can be used to identify possible weight problems. It estimates body fat based on height and weight. Your health care provider can help determine your BMI and help you achieve or maintain a healthy weight.  For females 44 years of age and older:  A BMI below 18.5 is considered underweight.  A BMI of 18.5 to 24.9 is normal.  A BMI of 25 to 29.9 is considered overweight.  A BMI of 30 and above is considered obese. Watch levels of cholesterol and blood lipids  You should start having your blood tested for lipids and cholesterol at 64 years of age, then have this test every 5 years.  You may need to  have your cholesterol levels checked more often if:  Your lipid or cholesterol levels are high.  You are older than 64 years of age.  You are at high risk for heart disease. Cancer screening Lung Cancer  Lung cancer screening is recommended for adults 37-24 years old who are at high risk for lung cancer because of a history of smoking.  A yearly low-dose CT scan of the lungs is recommended for people who:  Currently smoke.  Have quit within the past 15 years.  Have at least a 30-pack-year history of smoking. A pack year is smoking an average of one pack of cigarettes a day for 1 year.  Yearly screening should continue until it has been 15 years since you quit.  Yearly screening should stop if you develop a health problem that would prevent you from having lung cancer treatment. Breast Cancer  Practice breast self-awareness. This means understanding how your breasts normally appear and feel.  It also means doing regular breast self-exams. Let your health care provider know about any changes, no matter how small.  If you are in your 20s or 30s, you should have a clinical breast exam (CBE) by a health care provider every 1-3 years as part of a regular health exam.  If you are 68 or older, have a CBE every year. Also consider having a breast X-ray (mammogram) every year.  If you have a family history of breast cancer, talk to your health care provider about genetic screening.  If you are at high risk for breast cancer, talk to your health care provider about having an MRI and a mammogram every year.  Breast cancer gene (BRCA) assessment is recommended for women who have family members with BRCA-related cancers. BRCA-related cancers include:  Breast.  Ovarian.  Tubal.  Peritoneal cancers.  Results of the assessment will determine the need for genetic counseling and BRCA1 and BRCA2 testing. Cervical Cancer  Your health care provider may recommend that you be screened  regularly for cancer of the pelvic organs (ovaries, uterus, and vagina). This screening involves a pelvic examination, including checking for microscopic changes to the surface of your cervix (Pap test). You may be encouraged to have this screening done every 3 years, beginning at age 35.  For women ages 35-65, health care providers may recommend pelvic exams and Pap testing every 3 years, or they may recommend the Pap and pelvic exam, combined with testing for human papilloma virus (HPV), every 5 years. Some types of HPV increase your risk of cervical cancer. Testing for HPV may also be done on women of any age with unclear Pap test results.  Other health care providers may not recommend any screening for nonpregnant women who are considered low risk for pelvic cancer and who do not have symptoms. Ask your health care provider if a screening pelvic exam is right for you.  If you have had past treatment for cervical cancer or a condition that could lead to cancer, you need Pap tests and screening for cancer for at least 20 years after your treatment. If Pap tests have been discontinued, your risk factors (such as having a new sexual partner) need to be reassessed to determine if screening should resume. Some women have medical problems that increase the chance of getting cervical cancer. In these cases, your health care provider may recommend more frequent screening and Pap tests. Colorectal Cancer  This type of cancer can be detected and often prevented.  Routine colorectal cancer screening usually begins at 64 years of age and continues through 64 years of age.  Your health care provider may recommend screening at an earlier age if you have risk factors for colon cancer.  Your health care provider may also recommend using home test kits to check for hidden blood in the stool.  A small camera at the end of a tube can be used to examine your colon directly (sigmoidoscopy or colonoscopy). This is  done to check for the earliest forms of colorectal cancer.  Routine screening usually begins at age 53.  Direct examination of the colon should be repeated every 5-10 years through 64 years of age. However, you may need to be screened more often if early forms of precancerous polyps or small growths are found. Skin Cancer  Check your skin from head to toe regularly.  Tell your health care provider about any new moles or changes in moles, especially if there is a change in a mole's shape or color.  Also tell your health care provider if you have a mole that is larger than the size of a pencil eraser.  Always use sunscreen. Apply sunscreen liberally and repeatedly throughout the day.  Protect yourself by wearing long sleeves, pants, a wide-brimmed hat, and sunglasses whenever you are outside. Heart disease, diabetes, and high blood pressure  High blood pressure causes heart disease and increases the risk of stroke. High blood pressure is more likely to develop in:  People who have blood pressure in the  high end of the normal range (130-139/85-89 mm Hg).  People who are overweight or obese.  People who are African American.  If you are 66-36 years of age, have your blood pressure checked every 3-5 years. If you are 55 years of age or older, have your blood pressure checked every year. You should have your blood pressure measured twice-once when you are at a hospital or clinic, and once when you are not at a hospital or clinic. Record the average of the two measurements. To check your blood pressure when you are not at a hospital or clinic, you can use:  An automated blood pressure machine at a pharmacy.  A home blood pressure monitor.  If you are between 25 years and 56 years old, ask your health care provider if you should take aspirin to prevent strokes.  Have regular diabetes screenings. This involves taking a blood sample to check your fasting blood sugar level.  If you are at a  normal weight and have a low risk for diabetes, have this test once every three years after 64 years of age.  If you are overweight and have a high risk for diabetes, consider being tested at a younger age or more often. Preventing infection Hepatitis B  If you have a higher risk for hepatitis B, you should be screened for this virus. You are considered at high risk for hepatitis B if:  You were born in a country where hepatitis B is common. Ask your health care provider which countries are considered high risk.  Your parents were born in a high-risk country, and you have not been immunized against hepatitis B (hepatitis B vaccine).  You have HIV or AIDS.  You use needles to inject street drugs.  You live with someone who has hepatitis B.  You have had sex with someone who has hepatitis B.  You get hemodialysis treatment.  You take certain medicines for conditions, including cancer, organ transplantation, and autoimmune conditions. Hepatitis C  Blood testing is recommended for:  Everyone born from [birth year] through 1964.  Anyone with known risk factors for hepatitis C. Sexually transmitted infections (STIs)  You should be screened for sexually transmitted infections (STIs) including gonorrhea and chlamydia if:  You are sexually active and are younger than 64 years of age.  You are older than 64 years of age and your health care provider tells you that you are at risk for this type of infection.  Your sexual activity has changed since you were last screened and you are at an increased risk for chlamydia or gonorrhea. Ask your health care provider if you are at risk.  If you do not have HIV, but are at risk, it may be recommended that you take a prescription medicine daily to prevent HIV infection. This is called pre-exposure prophylaxis (PrEP). You are considered at risk if:  You are sexually active and do not regularly use condoms or know the HIV status of your partner(s).  You  take drugs by injection.  You are sexually active with a partner who has HIV. Talk with your health care provider about whether you are at high risk of being infected with HIV. If you choose to begin PrEP, you should first be tested for HIV. You should then be tested every 3 months for as long as you are taking PrEP. Pregnancy  If you are premenopausal and you may become pregnant, ask your health care provider about preconception counseling.  If you may become  pregnant, take 400 to 800 micrograms (mcg) of folic acid every day.  If you want to prevent pregnancy, talk to your health care provider about birth control (contraception). Osteoporosis and menopause  Osteoporosis is a disease in which the bones lose minerals and strength with aging. This can result in serious bone fractures. Your risk for osteoporosis can be identified using a bone density scan.  If you are 57 years of age or older, or if you are at risk for osteoporosis and fractures, ask your health care provider if you should be screened.  Ask your health care provider whether you should take a calcium or vitamin D supplement to lower your risk for osteoporosis.  Menopause may have certain physical symptoms and risks.  Hormone replacement therapy may reduce some of these symptoms and risks. Talk to your health care provider about whether hormone replacement therapy is right for you. Follow these instructions at home:  Schedule regular health, dental, and eye exams.  Stay current with your immunizations.  Do not use any tobacco products including cigarettes, chewing tobacco, or electronic cigarettes.  If you are pregnant, do not drink alcohol.  If you are breastfeeding, limit how much and how often you drink alcohol.  Limit alcohol intake to no more than 1 drink per day for nonpregnant women. One drink equals 12 ounces of beer, 5 ounces of wine, or 1 ounces of hard liquor.  Do not use street drugs.  Do not share  needles.  Ask your health care provider for help if you need support or information about quitting drugs.  Tell your health care provider if you often feel depressed.  Tell your health care provider if you have ever been abused or do not feel safe at home. This information is not intended to replace advice given to you by your health care provider. Make sure you discuss any questions you have with your health care provider. Document Released: 07/04/2010 Document Revised: 05/27/2015 Document Reviewed: 09/22/2014  2017 [employer]

## 2015-12-29 NOTE — Progress Notes (Signed)
Pre visit review using our clinic review tool, if applicable. No additional management support is needed unless otherwise documented below in the visit note. 

## 2015-12-29 NOTE — Progress Notes (Signed)
Subjective:    Patient ID: Jennifer Huff, female    DOB: Jun 09, 1950, 65 y.o.   MRN: [id number]  CC: Jennifer Huff is a 65 y.o. female who presents today for physical exam.    HPI: Overall feeling well.  Notes some sinus drainage, post nasal drip for 3 days, unchanged. No cough, wheezing, SOB. Hasnt tried any medications.     Colorectal Cancer Screening: UTD polpys, repeat 2021,every 5 years.  Breast Cancer Screening:UTD; 07/2015 normal Cervical Cancer Screening: No h/o abnormal Pap. h/o hysterectomy. No vaginal bleeding.  Bone Health screening/DEXA for 65+: 2017 normal.  Lung Cancer Screening: 30 year pack history.        Tetanus - UTD        Hepatitis C and HIV screening - declines  Labs: Screening labs today. Exercise: Plans to start regular exercise.  Alcohol use: Occasional Smoking/tobacco use: Former smoker.  Regular dental exams: In need of dental exam. Wears seat belt: Yes.  HISTORY:  Past Medical History:  Diagnosis Date  . Cataract   . HTN (hypertension)   . Vitamin D deficiency     Past Surgical History:  Procedure Laterality Date  . LAPAROSCOPIC HYSTERECTOMY     Family History  Problem Relation Age of Onset  . Kidney disease Father   . Heart disease Father   . Cancer Father     prostate  . Hypertension Sister   . Colon polyps Sister   . Hypertension Brother   . Hypertension Sister   . Cancer Paternal Grandmother     head and neck  . Hypertension Brother   . Colon cancer Neg Hx   . Breast cancer Neg Hx       ALLERGIES: Ace inhibitors; Codeine; and Penicillins  Current Outpatient Prescriptions on File Prior to Visit  Medication Sig Dispense Refill  . amLODipine (NORVASC) 5 MG tablet Take 1 tablet (5 mg total) by mouth daily. 90 tablet 4  . losartan (COZAAR) 50 MG tablet Take 1 tablet (50 mg total) by mouth daily. 90 tablet 4   No current facility-administered medications on file prior to visit.     Social History    Substance Use Topics  . Smoking status: Former Smoker    Quit date: 12/08/1999  . Smokeless tobacco: Never Used  . Alcohol use Yes     Comment: Occasional wine    Review of Systems  Constitutional: Negative for chills, fever and unexpected weight change.  HENT: Positive for congestion, postnasal drip and sinus pressure. Negative for sinus pain and sore throat.   Respiratory: Negative for cough, shortness of breath and wheezing.   Cardiovascular: Negative for chest pain, palpitations and leg swelling.  Gastrointestinal: Negative for nausea and vomiting.  Musculoskeletal: Negative for arthralgias and myalgias.  Skin: Negative for rash.  Neurological: Negative for headaches.  Hematological: Negative for adenopathy.  Psychiatric/Behavioral: Negative for confusion.      Objective:    BP 140/70   Pulse 85   Temp 98.1 F (36.7 C) (Oral)   Ht 5\' 6"  (1.676 m)   Wt 267 lb (121.1 kg)   SpO2 97%   BMI 43.09 kg/m   BP Readings from Last 3 Encounters:  12/29/15 140/70  06/29/15 132/86  05/17/15 (!) 144/82   Wt Readings from Last 3 Encounters:  12/29/15 267 lb (121.1 kg)  06/29/15 263 lb (119.3 kg)  05/17/15 259 lb 9.6 oz (117.8 kg)    Physical Exam  Constitutional: She appears well-developed and well-nourished.  HENT:  Head: Normocephalic and atraumatic.  Right Ear: Hearing, tympanic membrane, external ear and ear canal normal. No drainage, swelling or tenderness. No foreign bodies. Tympanic membrane is not erythematous and not bulging. No middle ear effusion. No decreased hearing is noted.  Left Ear: Hearing, tympanic membrane, external ear and ear canal normal. No drainage, swelling or tenderness. No foreign bodies. Tympanic membrane is not erythematous and not bulging.  No middle ear effusion. No decreased hearing is noted.  Nose: Nose normal. No rhinorrhea. Right sinus exhibits no maxillary sinus tenderness and no frontal sinus tenderness. Left sinus exhibits no maxillary  sinus tenderness and no frontal sinus tenderness.  Mouth/Throat: Uvula is midline and mucous membranes are normal. Posterior oropharyngeal erythema present. No oropharyngeal exudate, posterior oropharyngeal edema or tonsillar abscesses.  Eyes: Conjunctivae are normal.  Neck: No thyroid mass and no thyromegaly present.  Cardiovascular: Normal rate, regular rhythm, normal heart sounds and normal pulses.   Pulmonary/Chest: Effort normal and breath sounds normal. She has no wheezes. She has no rhonchi. She has no rales. Right breast exhibits no inverted nipple, no mass, no nipple discharge, no skin change and no tenderness. Left breast exhibits no inverted nipple, no mass, no nipple discharge, no skin change and no tenderness. Breasts are symmetrical.  CBE performed.   Lymphadenopathy:       Head (right side): No submental, no submandibular, no tonsillar, no preauricular, no posterior auricular and no occipital adenopathy present.       Head (left side): No submental, no submandibular, no tonsillar, no preauricular, no posterior auricular and no occipital adenopathy present.    She has no cervical adenopathy.       Right cervical: No superficial cervical, no deep cervical and no posterior cervical adenopathy present.      Left cervical: No superficial cervical, no deep cervical and no posterior cervical adenopathy present.    She has no axillary adenopathy.  Neurological: She is alert.  Skin: Skin is warm and dry.  Psychiatric: She has a normal mood and affect. Her speech is normal and behavior is normal. Thought content normal.  Vitals reviewed.      Assessment & Plan:   Problem List Items Addressed This Visit      Other   Routine general medical examination at a health care facility - Primary    Up-to-date on colonoscopy, mammogram. She no longer does Pap smears due to history of hysterectomy. Politely declines pelvic exam today due to age and preference. Clinical breast exam performed.  Up-to-date on immunizations. DEXA scan done this year. Due to history of smoking, CT chest ordered. Declines hepatitis C and HIV screening. Otherwise screening labs ordered today. Encouraged exercise program and healthy diet. Due to duration of sinus congestion, absence of fever, patient and I jointly agreed patient would treat conservatively at this time, she will start mucinex and Flonase. She will let me know not better.      Relevant Orders   CBC with Differential/Platelet   Comprehensive metabolic panel   Hemoglobin A1c   Lipid panel   VITAMIN D 25 Hydroxy (Vit-D Deficiency, Fractures)   TSH   CT CHEST LUNG CANCER SCREENING LOW DOSE WO CONTRAST       I am having Jennifer Huff maintain her losartan and amLODipine.   No orders of the defined types were placed in this encounter.   Return precautions given.   Risks, benefits, and alternatives of the medications and treatment plan prescribed today were discussed, and patient  expressed understanding.   Education regarding symptom management and diagnosis given to patient on AVS.   Continue to follow with Mable Paris, FNP for routine health maintenance.   Loetta Rough and I agreed with plan.   Mable Paris, FNP

## 2015-12-29 NOTE — Assessment & Plan Note (Addendum)
Up-to-date on colonoscopy, mammogram. She no longer does Pap smears due to history of hysterectomy. Politely declines pelvic exam today due to age and preference. Clinical breast exam performed. Up-to-date on immunizations. DEXA scan done this year. Due to history of smoking, CT chest ordered. Declines hepatitis C and HIV screening. Otherwise screening labs ordered today. Encouraged exercise program and healthy diet. Due to duration of sinus congestion, absence of fever, patient and I jointly agreed patient would treat conservatively at this time, she will start mucinex and Flonase. She will let me know not better.

## 2015-12-30 ENCOUNTER — Other Ambulatory Visit: Payer: Self-pay | Admitting: Family

## 2015-12-30 DIAGNOSIS — E785 Hyperlipidemia, unspecified: Secondary | ICD-10-CM

## 2015-12-30 MED ORDER — PRAVASTATIN SODIUM 40 MG PO TABS: 40.0000 mg | ORAL_TABLET | Freq: Every day | ORAL | 2 refills | Status: DC

## 2016-01-07 ENCOUNTER — Telehealth: Payer: Self-pay | Admitting: *Deleted

## 2016-01-07 NOTE — Telephone Encounter (Signed)
Received referral for initial lung cancer screening scan. Contacted patient and obtained smoking history. Per patient report she quit smoking >16 years ago. Patient made aware that she does not meet current recommendations for lung cancer screening scan with smoking cessation >15 years ago.

## 2016-06-26 ENCOUNTER — Ambulatory Visit: Payer: BC Managed Care – PPO | Admitting: Family

## 2016-06-27 ENCOUNTER — Ambulatory Visit: Payer: BC Managed Care – PPO | Admitting: Family

## 2016-06-28 ENCOUNTER — Ambulatory Visit: Payer: BC Managed Care – PPO | Admitting: Family

## 2016-07-13 ENCOUNTER — Encounter: Payer: Self-pay | Admitting: Family

## 2016-07-13 ENCOUNTER — Ambulatory Visit (INDEPENDENT_AMBULATORY_CARE_PROVIDER_SITE_OTHER): Payer: BC Managed Care – PPO | Admitting: Family

## 2016-07-13 VITALS — BP 135/75 | HR 79 | Temp 98.1°F | Ht 66.0 in | Wt 269.4 lb

## 2016-07-13 DIAGNOSIS — E785 Hyperlipidemia, unspecified: Secondary | ICD-10-CM | POA: Diagnosis not present

## 2016-07-13 DIAGNOSIS — Z1231 Encounter for screening mammogram for malignant neoplasm of breast: Secondary | ICD-10-CM

## 2016-07-13 DIAGNOSIS — I10 Essential (primary) hypertension: Secondary | ICD-10-CM

## 2016-07-13 DIAGNOSIS — Z1239 Encounter for other screening for malignant neoplasm of breast: Secondary | ICD-10-CM

## 2016-07-13 LAB — BASIC METABOLIC PANEL
BUN: 16 mg/dL (ref 6–23)
CO2: 29 meq/L (ref 19–32)
CREATININE: 1.23 mg/dL — AB (ref 0.40–1.20)
Calcium: 9.6 mg/dL (ref 8.4–10.5)
Chloride: 108 mEq/L (ref 96–112)
GFR: 56.39 mL/min — ABNORMAL LOW (ref >60.00)
Glucose, Bld: 150 mg/dL — ABNORMAL HIGH (ref 70–99)
Potassium: 4.2 mEq/L (ref 3.5–5.1)
Sodium: 142 mEq/L (ref 135–145)

## 2016-07-13 MED ORDER — PRAVASTATIN SODIUM 40 MG PO TABS: 40.0000 mg | ORAL_TABLET | Freq: Every day | ORAL | 2 refills | Status: DC

## 2016-07-13 NOTE — Assessment & Plan Note (Signed)
Ordered. Patient understands to schedule appointment.

## 2016-07-13 NOTE — Progress Notes (Signed)
Subjective:    Patient ID: Jennifer Huff, female    DOB: 27-Sep-1950, 66 y.o.   MRN: [id number]  CC: Jennifer Huff is a 66 y.o. female who presents today for follow up.   HPI: HTN- compliant with medications. Doesn't check BP at home. Denies exertional chest pain or pressure, numbness or tingling radiating to left arm or jaw, palpitations, dizziness, frequent headaches, changes in vision, or shortness of breath.    HLD- compliant with medication. Would like refill.  Due for mammogram. Patient will return physical exam, Pap     Pap 2012 HISTORY:  Past Medical History:  Diagnosis Date  . Cataract   . HTN (hypertension)   . Vitamin D deficiency    Past Surgical History:  Procedure Laterality Date  . LAPAROSCOPIC HYSTERECTOMY     Family History  Problem Relation Age of Onset  . Kidney disease Father   . Heart disease Father   . Cancer Father        prostate  . Hypertension Sister   . Colon polyps Sister   . Hypertension Brother   . Hypertension Sister   . Cancer Paternal Grandmother        head and neck  . Hypertension Brother   . Colon cancer Neg Hx   . Breast cancer Neg Hx     Allergies: Ace inhibitors; Codeine; and Penicillins Current Outpatient Prescriptions on File Prior to Visit  Medication Sig Dispense Refill  . amLODipine (NORVASC) 5 MG tablet Take 1 tablet (5 mg total) by mouth daily. 90 tablet 4  . losartan (COZAAR) 50 MG tablet Take 1 tablet (50 mg total) by mouth daily. 90 tablet 4   No current facility-administered medications on file prior to visit.     Social History  Substance Use Topics  . Smoking status: Former Smoker    Quit date: 12/08/1999  . Smokeless tobacco: Never Used  . Alcohol use Yes     Comment: Occasional wine    Review of Systems  Constitutional: Negative for chills and fever.  Respiratory: Negative for cough.   Cardiovascular: Negative for chest pain and palpitations.  Gastrointestinal: Negative for  nausea and vomiting.      Objective:    BP 135/75   Pulse 79   Temp 98.1 F (36.7 C) (Oral)   Ht 5\' 6"  (1.676 m)   Wt 269 lb 6.4 oz (122.2 kg)   SpO2 96%   BMI 43.48 kg/m  BP Readings from Last 3 Encounters:  07/13/16 135/75  12/29/15 140/70  06/29/15 132/86   Wt Readings from Last 3 Encounters:  07/13/16 269 lb 6.4 oz (122.2 kg)  12/29/15 267 lb (121.1 kg)  06/29/15 263 lb (119.3 kg)    Physical Exam  Constitutional: She appears well-developed and well-nourished.  Eyes: Conjunctivae are normal.  Cardiovascular: Normal rate, regular rhythm, normal heart sounds and normal pulses.   Pulmonary/Chest: Effort normal and breath sounds normal. She has no wheezes. She has no rhonchi. She has no rales.  Neurological: She is alert.  Skin: Skin is warm and dry.  Psychiatric: She has a normal mood and affect. Her speech is normal and behavior is normal. Thought content normal.  Vitals reviewed.      Assessment & Plan:   Problem List Items Addressed This Visit      Cardiovascular and Mediastinum   Hypertension - Primary    Well-controlled. Continue current regimen.      Relevant Medications   pravastatin (PRAVACHOL) 40  MG tablet   Other Relevant Orders   Basic metabolic panel     Other   Screening for breast cancer    Ordered. Patient understands to schedule appointment.      Relevant Orders   MM SCREENING BREAST TOMO BILATERAL    Other Visit Diagnoses    Hyperlipidemia, unspecified hyperlipidemia type       Relevant Medications   pravastatin (PRAVACHOL) 40 MG tablet       I am having Jennifer Huff maintain her losartan, amLODipine, and pravastatin.   Meds ordered this encounter  Medications  . pravastatin (PRAVACHOL) 40 MG tablet    Sig: Take 1 tablet (40 mg total) by mouth daily.    Dispense:  90 tablet    Refill:  2    Order Specific Question:   Supervising Provider    Answer:   Crecencio Mc [[id number]]    Return precautions given.   Risks,  benefits, and alternatives of the medications and treatment plan prescribed today were discussed, and patient expressed understanding.   Education regarding symptom management and diagnosis given to patient on AVS.  Continue to follow with Burnard Hawthorne, FNP for routine health maintenance.   Loetta Rough and I agreed with plan.   Mable Paris, FNP

## 2016-07-13 NOTE — Patient Instructions (Addendum)
Labs  We placed a referral. Mammogram this year. I asked that you call one the below locations and schedule this when it is convenient for you.   If you have dense breasts, you may ask for 3D mammogram over the traditional 2D mammogram as new evidence suggest 3D is superior. Please note that NOT all insurance companies cover 3D and you may have to pay a higher copay. You may call your insurance company to further clarify your benefits.   Options for [place]  [place], [place]  * [facility] [place], [place] * Note if you ask for 3D mammogram at this location, you must request [place], [place] location*

## 2016-07-13 NOTE — Assessment & Plan Note (Signed)
Well-controlled.  Continue current regimen. 

## 2016-08-10 ENCOUNTER — Ambulatory Visit
Admission: RE | Admit: 2016-08-10 | Discharge: 2016-08-10 | Disposition: A | Discharge status: Home / Self Care | Payer: BC Managed Care – PPO | Source: Ambulatory Visit | Attending: Family | Admitting: Family

## 2016-08-10 DIAGNOSIS — Z1239 Encounter for other screening for malignant neoplasm of breast: Secondary | ICD-10-CM

## 2016-08-10 DIAGNOSIS — Z1231 Encounter for screening mammogram for malignant neoplasm of breast: Secondary | ICD-10-CM | POA: Insufficient documentation

## 2016-10-19 ENCOUNTER — Ambulatory Visit (INDEPENDENT_AMBULATORY_CARE_PROVIDER_SITE_OTHER): Payer: Medicare Other | Admitting: *Deleted

## 2016-10-19 DIAGNOSIS — Z23 Encounter for immunization: Secondary | ICD-10-CM

## 2016-12-18 ENCOUNTER — Other Ambulatory Visit: Payer: Self-pay | Admitting: Family

## 2016-12-18 DIAGNOSIS — I10 Essential (primary) hypertension: Secondary | ICD-10-CM

## 2016-12-18 MED ORDER — AMLODIPINE BESYLATE 5 MG PO TABS: 5.0000 mg | ORAL_TABLET | Freq: Every day | ORAL | 1 refills | Status: DC

## 2016-12-18 NOTE — Telephone Encounter (Signed)
Copied from [place] [id number]. Topic: Quick Communication - Rx Refill/Question >> Dec 18, 2016 11:12 AM Patrice Paradise wrote: Has the patient contacted their pharmacy? Yes.   amLODipine (NORVASC) 5 MG tablet (PATIENT IS OUT OF HER MEDS)  (Agent: If no, request that the patient contact the pharmacy for the refill.)  Preferred Pharmacy (with phone number or street name):  [facility] [place], [place] [address] [place] [address] Phone: [phone] Fax: [phone]   Agent: Please be advised that RX refills may take up to 3 business days. We ask that you follow-up with your pharmacy.

## 2016-12-18 NOTE — Telephone Encounter (Signed)
Left message letting pt know medication was going to be filled and sent to requested pharmacy.

## 2017-01-04 ENCOUNTER — Ambulatory Visit: Payer: BC Managed Care – PPO | Admitting: Family

## 2017-02-14 ENCOUNTER — Encounter: Payer: Self-pay | Admitting: Family

## 2017-02-14 ENCOUNTER — Ambulatory Visit (INDEPENDENT_AMBULATORY_CARE_PROVIDER_SITE_OTHER): Payer: Medicare Other | Admitting: Family

## 2017-02-14 VITALS — BP 135/85 | HR 84 | Temp 98.4°F | Resp 16 | Wt 273.4 lb

## 2017-02-14 DIAGNOSIS — I10 Essential (primary) hypertension: Secondary | ICD-10-CM

## 2017-02-14 LAB — COMPREHENSIVE METABOLIC PANEL
ALK PHOS: 87 U/L (ref 39–117)
ALT: 8 U/L (ref 0–35)
AST: 12 U/L (ref 0–37)
Albumin: 4.1 g/dL (ref 3.5–5.2)
BUN: 14 mg/dL (ref 6–23)
CHLORIDE: 107 meq/L (ref 96–112)
CO2: 28 mEq/L (ref 19–32)
Calcium: 9.7 mg/dL (ref 8.4–10.5)
Creatinine, Ser: 1.23 mg/dL — ABNORMAL HIGH (ref 0.40–1.20)
GFR: 56.28 mL/min — AB (ref >60.00)
GLUCOSE: 148 mg/dL — AB (ref 70–99)
POTASSIUM: 4.3 meq/L (ref 3.5–5.1)
Sodium: 141 mEq/L (ref 135–145)
TOTAL PROTEIN: 7.4 g/dL (ref 6.0–8.3)
Total Bilirubin: 0.7 mg/dL (ref 0.2–1.2)

## 2017-02-14 LAB — MICROALBUMIN / CREATININE URINE RATIO
Creatinine,U: 87.5 mg/dL
MICROALB UR: 11.9 mg/dL — AB (ref 0.0–1.9)
MICROALB/CREAT RATIO: 13.6 mg/g (ref 0.0–30.0)

## 2017-02-14 LAB — LIPID PANEL
CHOL/HDL RATIO: 2
Cholesterol: 133 mg/dL (ref 0–200)
HDL: 55.5 mg/dL (ref >39.00)
LDL Cholesterol: 63 mg/dL (ref 0–99)
NONHDL: 77.75
Triglycerides: 72 mg/dL (ref 0.0–149.0)
VLDL: 14.4 mg/dL (ref 0.0–40.0)

## 2017-02-14 LAB — VITAMIN D 25 HYDROXY (VIT D DEFICIENCY, FRACTURES): VITD: 28.05 ng/mL — ABNORMAL LOW (ref 30.00–100.00)

## 2017-02-14 LAB — HEMOGLOBIN A1C: Hgb A1c MFr Bld: 6.5 % (ref 4.6–6.5)

## 2017-02-14 LAB — TSH: TSH: 4.12 u[IU]/mL (ref 0.35–4.50)

## 2017-02-14 NOTE — Progress Notes (Signed)
Subjective:    Patient ID: Brithany Whitworth, female    DOB: 09/01/1950, 67 y.o.   MRN: [id number]  CC: Azelie Noguera is a 67 y.o. female who presents today for follow up.   HPI: Feels well. No complaints today  HTN- compliant with medications. Not checking BP at home. Denies exertional chest pain or pressure, numbness or tingling radiating to left arm or jaw, palpitations, dizziness, frequent headaches, changes in vision, or shortness of breath.  No exercise at this time.  Father died from MI  Former smoker- quit 17 years ago.        HISTORY:  Past Medical History:  Diagnosis Date  . Cataract   . HTN (hypertension)   . Vitamin D deficiency    Past Surgical History:  Procedure Laterality Date  . LAPAROSCOPIC HYSTERECTOMY     Family History  Problem Relation Age of Onset  . Kidney disease Father   . Heart disease Father   . Cancer Father        prostate  . Hypertension Sister   . Colon polyps Sister   . Hypertension Brother   . Hypertension Sister   . Cancer Paternal Grandmother        head and neck  . Hypertension Brother   . Colon cancer Neg Hx   . Breast cancer Neg Hx     Allergies: Ace inhibitors; Codeine; and Penicillins Current Outpatient Medications on File Prior to Visit  Medication Sig Dispense Refill  . amLODipine (NORVASC) 5 MG tablet Take 1 tablet (5 mg total) by mouth daily. 90 tablet 1  . pravastatin (PRAVACHOL) 40 MG tablet Take 1 tablet (40 mg total) by mouth daily. 90 tablet 2  . losartan (COZAAR) 50 MG tablet Take 1 tablet (50 mg total) by mouth daily. 90 tablet 4   No current facility-administered medications on file prior to visit.     Social History   Tobacco Use  . Smoking status: Former Smoker    Last attempt to quit: 12/08/1999    Years since quitting: 17.2  . Smokeless tobacco: Never Used  Substance Use Topics  . Alcohol use: Yes    Comment: Occasional wine  . Drug use: No    Review of Systems    Constitutional: Negative for chills, fever and unexpected weight change.  HENT: Negative for congestion.   Respiratory: Negative for cough and shortness of breath.   Cardiovascular: Negative for chest pain, palpitations and leg swelling.  Gastrointestinal: Negative for nausea and vomiting.  Musculoskeletal: Negative for arthralgias and myalgias.  Skin: Negative for rash.  Neurological: Negative for headaches.  Hematological: Negative for adenopathy.  Psychiatric/Behavioral: Negative for confusion.      Objective:    BP 135/85 (BP Location: Left Arm)   Pulse 84   Temp 98.4 F (36.9 C) (Oral)   Resp 16   Wt 273 lb 6 oz (124 kg)   BMI 44.12 kg/m  BP Readings from Last 3 Encounters:  02/14/17 135/85  07/13/16 135/75  12/29/15 140/70   Wt Readings from Last 3 Encounters:  02/14/17 273 lb 6 oz (124 kg)  07/13/16 269 lb 6.4 oz (122.2 kg)  12/29/15 267 lb (121.1 kg)    Physical Exam  Constitutional: She appears well-developed and well-nourished.  Eyes: Conjunctivae are normal.  Cardiovascular: Normal rate, regular rhythm, normal heart sounds and normal pulses.  Pulmonary/Chest: Effort normal and breath sounds normal. She has no wheezes. She has no rhonchi. She has no rales.  Neurological: She is alert.  Skin: Skin is warm and dry.  Psychiatric: She has a normal mood and affect. Her speech is normal and behavior is normal. Thought content normal.  Vitals reviewed.      Assessment & Plan:   Problem List Items Addressed This Visit      Cardiovascular and Mediastinum   Hypertension - Primary    At goal. Discussed BP goal and lifestyle factors. Due to CVD risk factors and family history, jointly agreed cardiology consult appropriate. Patient will keep BP log at home and we will titrate medications as needed from there.        Relevant Orders   Lipid panel   Microalbumin / creatinine urine ratio   Comprehensive metabolic panel   Hemoglobin A1c   TSH   VITAMIN D 25  Hydroxy (Vit-D Deficiency, Fractures)   Ambulatory referral to Cardiology       I am having [name]. [name] maintain her losartan, pravastatin, and amLODipine.   No orders of the defined types were placed in this encounter.   Return precautions given.   Risks, benefits, and alternatives of the medications and treatment plan prescribed today were discussed, and patient expressed understanding.   Education regarding symptom management and diagnosis given to patient on AVS.  Continue to follow with Burnard Hawthorne, FNP for routine health maintenance.   Loetta Rough and I agreed with plan.   Mable Paris, FNP

## 2017-02-14 NOTE — Assessment & Plan Note (Signed)
At goal. Discussed BP goal and lifestyle factors. Due to CVD risk factors and family history, jointly agreed cardiology consult appropriate. Patient will keep BP log at home and we will titrate medications as needed from there.

## 2017-02-14 NOTE — Patient Instructions (Signed)
Referral to cardiology  Labs today  Please return for  Welcome to medicare wellness physical, pelvic exam since within year of your 66th birthday  Monitor blood pressure,  Goal is less than 130/80; if persistently higher, please make sooner follow up appointment so we can recheck you blood pressure and manage medications. We may need to increase your medications for optimal control.   LOW salt  Walking program as discussed   Managing Your Hypertension Hypertension is commonly called high blood pressure. This is when the force of your blood pressing against the walls of your arteries is too strong. Arteries are blood vessels that carry blood from your heart throughout your body. Hypertension forces the heart to work harder to pump blood, and may cause the arteries to become narrow or stiff. Having untreated or uncontrolled hypertension can cause heart attack, stroke, kidney disease, and other problems. What are blood pressure readings? A blood pressure reading consists of a higher number over a lower number. Ideally, your blood pressure should be below 120/80. The first ("top") number is called the systolic pressure. It is a measure of the pressure in your arteries as your heart beats. The second ("bottom") number is called the diastolic pressure. It is a measure of the pressure in your arteries as the heart relaxes. What does my blood pressure reading mean? Blood pressure is classified into four stages. Based on your blood pressure reading, your health care provider may use the following stages to determine what type of treatment you need, if any. Systolic pressure and diastolic pressure are measured in a unit called mm Hg. Normal  Systolic pressure: below 093.  Diastolic pressure: below 80. Elevated  Systolic pressure: 235-573.  Diastolic pressure: below 80. Hypertension stage 1  Systolic pressure: 220-254.  Diastolic pressure: 27-06. Hypertension stage 2  Systolic pressure: 237  or above.  Diastolic pressure: 90 or above. What health risks are associated with hypertension? Managing your hypertension is an important responsibility. Uncontrolled hypertension can lead to:  A heart attack.  A stroke.  A weakened blood vessel (aneurysm).  Heart failure.  Kidney damage.  Eye damage.  Metabolic syndrome.  Memory and concentration problems.  What changes can I make to manage my hypertension? Hypertension can be managed by making lifestyle changes and possibly by taking medicines. Your health care provider will help you make a plan to bring your blood pressure within a normal range. Eating and drinking  Eat a diet that is high in fiber and potassium, and low in salt (sodium), added sugar, and fat. An example eating plan is called the DASH (Dietary Approaches to Stop Hypertension) diet. To eat this way: ? Eat plenty of fresh fruits and vegetables. Try to fill half of your plate at each meal with fruits and vegetables. ? Eat whole grains, such as whole wheat pasta, brown rice, or whole grain bread. Fill about one quarter of your plate with whole grains. ? Eat low-fat diary products. ? Avoid fatty cuts of meat, processed or cured meats, and poultry with skin. Fill about one quarter of your plate with lean proteins such as fish, chicken without skin, beans, eggs, and tofu. ? Avoid premade and processed foods. These tend to be higher in sodium, added sugar, and fat.  Reduce your daily sodium intake. Most people with hypertension should eat less than 1,500 mg of sodium a day.  Limit alcohol intake to no more than 1 drink a day for nonpregnant women and 2 drinks a day for men.  One drink equals 12 oz of beer, 5 oz of wine, or 1 oz of hard liquor. Lifestyle  Work with your health care provider to maintain a healthy body weight, or to lose weight. Ask what an ideal weight is for you.  Get at least 30 minutes of exercise that causes your heart to beat faster (aerobic  exercise) most days of the week. Activities may include walking, swimming, or biking.  Include exercise to strengthen your muscles (resistance exercise), such as weight lifting, as part of your weekly exercise routine. Try to do these types of exercises for 30 minutes at least 3 days a week.  Do not use any products that contain nicotine or tobacco, such as cigarettes and e-cigarettes. If you need help quitting, ask your health care provider.  Control any long-term (chronic) conditions you have, such as high cholesterol or diabetes. Monitoring  Monitor your blood pressure at home as told by your health care provider. Your personal target blood pressure may vary depending on your medical conditions, your age, and other factors.  Have your blood pressure checked regularly, as often as told by your health care provider. Working with your health care provider  Review all the medicines you take with your health care provider because there may be side effects or interactions.  Talk with your health care provider about your diet, exercise habits, and other lifestyle factors that may be contributing to hypertension.  Visit your health care provider regularly. Your health care provider can help you create and adjust your plan for managing hypertension. Will I need medicine to control my blood pressure? Your health care provider may prescribe medicine if lifestyle changes are not enough to get your blood pressure under control, and if:  Your systolic blood pressure is 130 or higher.  Your diastolic blood pressure is 80 or higher.  Take medicines only as told by your health care provider. Follow the directions carefully. Blood pressure medicines must be taken as prescribed. The medicine does not work as well when you skip doses. Skipping doses also puts you at risk for problems. Contact a health care provider if:  You think you are having a reaction to medicines you have taken.  You have repeated  (recurrent) headaches.  You feel dizzy.  You have swelling in your ankles.  You have trouble with your vision. Get help right away if:  You develop a severe headache or confusion.  You have unusual weakness or numbness, or you feel faint.  You have severe pain in your chest or abdomen.  You vomit repeatedly.  You have trouble breathing. Summary  Hypertension is when the force of blood pumping through your arteries is too strong. If this condition is not controlled, it may put you at risk for serious complications.  Your personal target blood pressure may vary depending on your medical conditions, your age, and other factors. For most people, a normal blood pressure is less than 120/80.  Hypertension is managed by lifestyle changes, medicines, or both. Lifestyle changes include weight loss, eating a healthy, low-sodium diet, exercising more, and limiting alcohol. This information is not intended to replace advice given to you by your health care provider. Make sure you discuss any questions you have with your health care provider. Document Released: 09/13/2011 Document Revised: 11/17/2015 Document Reviewed: 11/17/2015 Elsevier Interactive Patient Education  [employer].

## 2017-02-20 ENCOUNTER — Ambulatory Visit: Payer: Medicare Other | Admitting: Family

## 2017-03-06 ENCOUNTER — Telehealth: Payer: Self-pay | Admitting: Family

## 2017-03-06 NOTE — Telephone Encounter (Signed)
Mail pt  I reviewing your chart, it appears that you are due for CT of the chest for a lung cancer screen. It is annual test.  From an insurance perspective, as long as you meet the following criteria, it is paid for since it is preventative care.    Criteria for low dose lung cancer screening scan:  Age between 55-77   Current smoker or former if quit within the last 15 years  History of smoking at least 1 pack a day for 30 years or that equivalent. (2 packs a day for 15 years,  pack a day for 60 years)  I had ordered in the past for you.   If it is a screen that you a still interested in, please call the office or send me a mychart message to let me know so that I can order for you.   Hope you are well.  Best,  Mable Paris, NP

## 2017-03-15 ENCOUNTER — Encounter: Payer: Self-pay | Admitting: *Deleted

## 2017-03-15 NOTE — Telephone Encounter (Signed)
Letter mailed

## 2017-03-16 ENCOUNTER — Encounter: Payer: Self-pay | Admitting: Family

## 2017-03-19 ENCOUNTER — Other Ambulatory Visit: Payer: Self-pay | Admitting: Family

## 2017-03-19 DIAGNOSIS — I10 Essential (primary) hypertension: Secondary | ICD-10-CM

## 2017-03-19 MED ORDER — LOSARTAN POTASSIUM 100 MG PO TABS: 100.0000 mg | ORAL_TABLET | Freq: Every day | ORAL | 4 refills | Status: DC

## 2017-03-29 ENCOUNTER — Other Ambulatory Visit (INDEPENDENT_AMBULATORY_CARE_PROVIDER_SITE_OTHER): Payer: Medicare Other

## 2017-03-29 DIAGNOSIS — I10 Essential (primary) hypertension: Secondary | ICD-10-CM | POA: Diagnosis not present

## 2017-03-29 LAB — BASIC METABOLIC PANEL
BUN: 13 mg/dL (ref 6–23)
CALCIUM: 10.1 mg/dL (ref 8.4–10.5)
CO2: 30 meq/L (ref 19–32)
CREATININE: 1.2 mg/dL (ref 0.40–1.20)
Chloride: 106 mEq/L (ref 96–112)
GFR: 57.89 mL/min — AB (ref >60.00)
Glucose, Bld: 133 mg/dL — ABNORMAL HIGH (ref 70–99)
Potassium: 3.8 mEq/L (ref 3.5–5.1)
Sodium: 142 mEq/L (ref 135–145)

## 2017-04-11 ENCOUNTER — Ambulatory Visit (INDEPENDENT_AMBULATORY_CARE_PROVIDER_SITE_OTHER): Payer: Medicare Other | Admitting: Family

## 2017-04-11 ENCOUNTER — Other Ambulatory Visit (HOSPITAL_COMMUNITY)
Admission: RE | Admit: 2017-04-11 | Discharge: 2017-04-11 | Disposition: A | Discharge status: Home / Self Care | Payer: Medicare Other | Source: Ambulatory Visit | Attending: Family | Admitting: Family

## 2017-04-11 ENCOUNTER — Encounter: Payer: Self-pay | Admitting: Family

## 2017-04-11 VITALS — BP 140/86 | HR 75 | Temp 98.2°F | Resp 16 | Wt 265.4 lb

## 2017-04-11 DIAGNOSIS — F419 Anxiety disorder, unspecified: Secondary | ICD-10-CM | POA: Diagnosis not present

## 2017-04-11 DIAGNOSIS — I10 Essential (primary) hypertension: Secondary | ICD-10-CM | POA: Diagnosis not present

## 2017-04-11 DIAGNOSIS — F329 Major depressive disorder, single episode, unspecified: Secondary | ICD-10-CM | POA: Diagnosis not present

## 2017-04-11 DIAGNOSIS — Z0000 Encounter for general adult medical examination without abnormal findings: Secondary | ICD-10-CM | POA: Insufficient documentation

## 2017-04-11 DIAGNOSIS — F32A Depression, unspecified: Secondary | ICD-10-CM

## 2017-04-11 MED ORDER — SERTRALINE HCL 50 MG PO TABS: 50.0000 mg | ORAL_TABLET | Freq: Every day | ORAL | 3 refills | Status: DC

## 2017-04-11 MED ORDER — HYDROCHLOROTHIAZIDE 12.5 MG PO CAPS: ORAL_CAPSULE | ORAL | 1 refills | Status: DC

## 2017-04-11 NOTE — Assessment & Plan Note (Addendum)
No cardiac complaints today.  She is pending a cardiac evaluation in 2 weeks.  Baseline EKG along to Medicare showed no acute ischemia.  Compared to prior EKG 2014, no acute changes.  T wave flattening noted on prior EKG as well. Clinical breast exam performed.   Pelvic exam performed and vaginal cells collected.  I was unable to appreciate a cervix.  I have swabbed the vaginal canal and asked on specimen to look for cervical cells which may indicate patient still has cervix after hysterectomy.  Based on results today, patient and I will decide whether or not we pursue a Pap smear in the following year.    Information on living well, medical power of attorney given to patient  so that she may update her prior.  Advised to bring this our office so we can put in her chart.

## 2017-04-11 NOTE — Progress Notes (Signed)
Subjective:    Patient ID: Jennifer Huff, female    DOB: 1950-06-14, 67 y.o.   MRN: [id number]  CC: Jennifer Huff is a 67 y.o. female who presents today for physical exam.    HPI: Notes she was recently told by her son that he has a type of nasal cancer.  She been quite anxious, tearful over this.  Trouble sleeping.  She wants to be strong for him.  No SI/HI.  HTN- Compliant with medication. Son has been told he has nasal cancer. Emotional about this. Eating less salt. Little bit of swelling in both ankles at end of day. Takes socks off and resolves.   Denies exertional chest pain or pressure, numbness or tingling radiating to left arm or jaw, palpitations, dizziness, frequent headaches, changes in vision, or shortness of breath.    Cardiology appt 4/15     Colorectal Cancer Screening: UTD 2016, repeat 2021 Breast Cancer Screening: Mammogram UTD Cervical Cancer Screening: due; h/o hysterectomy Bone Health screening/DEXA for 65+:last 2017  Lung Cancer Screening: Doesn't have 30 year pack year history and age > 84 years. Quit in 1999       Tetanus - UTD        Pneumococcal - Candidate for Prevnar Labs: Screening labs done prior Exercise: Gets regular exercise, starting to loose weight.  Alcohol use: occcasional  Smoking/tobacco use: Former smoker.  Regular dental exams: UTD Skin: new moles 'all the time' ; no h/o skin cancer; declines referral at this time Wears seat belt: Yes. Advanced planning: Has completed in the past.     Visual Acuity Screening   Right eye Left eye Both eyes  Without correction:     With correction: 20/20 20/25 20/15      HISTORY:  Past Medical History:  Diagnosis Date  . Cataract   . HTN (hypertension)   . Vitamin D deficiency     Past Surgical History:  Procedure Laterality Date  . LAPAROSCOPIC HYSTERECTOMY     Family History  Problem Relation Age of Onset  . Kidney disease Father   . Heart disease Father        died  from mi  . Cancer Father        prostate  . Hypertension Sister   . Colon polyps Sister   . Hypertension Brother   . Hypertension Sister   . Cancer Paternal Grandmother        head and neck  . Hypertension Brother   . Colon cancer Neg Hx   . Breast cancer Neg Hx       ALLERGIES: Ace inhibitors; Codeine; and Penicillins  Current Outpatient Medications on File Prior to Visit  Medication Sig Dispense Refill  . amLODipine (NORVASC) 5 MG tablet Take 1 tablet (5 mg total) by mouth daily. 90 tablet 1  . Cholecalciferol (VITAMIN D3) 2000 units TABS Take by mouth daily.    Marland Kitchen losartan (COZAAR) 100 MG tablet Take 1 tablet (100 mg total) by mouth daily. 90 tablet 4  . pravastatin (PRAVACHOL) 40 MG tablet Take 1 tablet (40 mg total) by mouth daily. 90 tablet 2   No current facility-administered medications on file prior to visit.     Social History   Tobacco Use  . Smoking status: Former Smoker    Last attempt to quit: 12/07/1997    Years since quitting: 19.3  . Smokeless tobacco: Never Used  Substance Use Topics  . Alcohol use: Yes    Comment: Occasional wine  .  Drug use: No    Review of Systems  Constitutional: Negative for chills, fever and unexpected weight change.  HENT: Negative for congestion.   Respiratory: Negative for cough.   Cardiovascular: Negative for chest pain, palpitations and leg swelling.  Gastrointestinal: Negative for nausea and vomiting.  Musculoskeletal: Negative for arthralgias and myalgias.  Skin: Negative for rash.  Neurological: Negative for headaches.  Hematological: Negative for adenopathy.  Psychiatric/Behavioral: Negative for confusion.      Objective:    BP 140/86 (BP Location: Left Arm, Patient Position: Sitting, Cuff Size: Large)   Pulse 75   Temp 98.2 F (36.8 C) (Oral)   Resp 16   Wt 265 lb 6 oz (120.4 kg)   SpO2 96%   BMI 42.83 kg/m   BP Readings from Last 3 Encounters:  04/11/17 140/86  02/14/17 135/85  07/13/16 135/75   Wt  Readings from Last 3 Encounters:  04/11/17 265 lb 6 oz (120.4 kg)  02/14/17 273 lb 6 oz (124 kg)  07/13/16 269 lb 6.4 oz (122.2 kg)    Physical Exam  Constitutional: She appears well-developed and well-nourished.  Eyes: Conjunctivae are normal.  Neck: No thyroid mass and no thyromegaly present.  Cardiovascular: Normal rate, regular rhythm, normal heart sounds and normal pulses.  Pulmonary/Chest: Effort normal and breath sounds normal. She has no wheezes. She has no rhonchi. She has no rales. Right breast exhibits no inverted nipple, no mass, no nipple discharge, no skin change and no tenderness. Left breast exhibits no inverted nipple, no mass, no nipple discharge, no skin change and no tenderness. Breasts are symmetrical.  Genitourinary: Rectum normal. Rectal exam shows no external hemorrhoid. Cervix exhibits no motion tenderness and no discharge. Right adnexum displays no mass, no tenderness and no fullness. Left adnexum displays no mass, no tenderness and no fullness. No erythema, tenderness or bleeding in the vagina. No foreign body in the vagina. No signs of injury around the vagina. No vaginal discharge found.  Genitourinary Comments: Normal hair distribution. No lesions seen over suprapubic area. No masses, tenderness of urethra.  Pap performed.   Lymphadenopathy:       Head (right side): No submental, no submandibular, no tonsillar, no preauricular, no posterior auricular and no occipital adenopathy present.       Head (left side): No submental, no submandibular, no tonsillar, no preauricular, no posterior auricular and no occipital adenopathy present.       Right cervical: No superficial cervical, no deep cervical and no posterior cervical adenopathy present.      Left cervical: No superficial cervical, no deep cervical and no posterior cervical adenopathy present.    She has no axillary adenopathy.       Right axillary: No pectoral and no lateral adenopathy present.       Left  axillary: No pectoral and no lateral adenopathy present. Neurological: She is alert.  Skin: Skin is warm and dry.  Psychiatric: She has a normal mood and affect. Her speech is normal and behavior is normal. Thought content normal.  Vitals reviewed.      Assessment & Plan:   Problem List Items Addressed This Visit      Cardiovascular and Mediastinum   Hypertension    Slightly above goal.  This may be in the context of recent news of her son's cancer.  She is following a low-salt diet.  I given her a very low dose of hydrochlorothiazide.  We will start this and recheck her labs.  Relevant Medications   hydrochlorothiazide (MICROZIDE) 12.5 MG capsule   Other Relevant Orders   Basic metabolic panel     Other   Routine general medical examination at a health care facility - Primary    No cardiac complaints today.  She is pending a cardiac evaluation in 2 weeks.  Baseline EKG along to Medicare showed no acute ischemia.  Compared to prior EKG 2014, no acute changes.  T wave flattening noted on prior EKG as well. Clinical breast exam performed.   Pelvic exam performed and vaginal cells collected.  I was unable to appreciate a cervix.  I have swabbed the vaginal canal and asked on specimen to look for cervical cells which may indicate patient still has cervix after hysterectomy.  Based on results today, patient and I will decide whether or not we pursue a Pap smear in the following year.    Information on living well, medical power of attorney given to patient  so that she may update her prior.  Advised to bring this our office so we can put in her chart.       Relevant Orders   EKG 12-Lead (Completed)   DG Bone Density   Cytology - PAP   Anxiety and depression    Offered my support in any way I can in patient's son's recent diagnosis.  We will start a low dose of Zoloft and go from there.  Patient will let me know how she is doing.      Relevant Medications   sertraline (ZOLOFT) 50  MG tablet       I am having Jennifer Kelch. Huff start on hydrochlorothiazide and sertraline. [name] her pravastatin, amLODipine, losartan, and Vitamin D3.   Meds ordered this encounter  Medications  . hydrochlorothiazide (MICROZIDE) 12.5 MG capsule    Sig: Take 1/2 capsule by mouth once per day.    Dispense:  90 capsule    Refill:  1    Order Specific Question:   Supervising Provider    Answer:   Derrel Nip, TERESA L [[id number]]  . sertraline (ZOLOFT) 50 MG tablet    Sig: Take 1 tablet (50 mg total) by mouth at bedtime.    Dispense:  90 tablet    Refill:  3    Order Specific Question:   Supervising Provider    Answer:   Crecencio Mc [[id number]]    Return precautions given.   Risks, benefits, and alternatives of the medications and treatment plan prescribed today were discussed, and patient expressed understanding.   Education regarding symptom management and diagnosis given to patient on AVS.   Continue to follow with Burnard Hawthorne, FNP for routine health maintenance.   Jennifer Huff and I agreed with plan.   Mable Paris, FNP

## 2017-04-11 NOTE — Assessment & Plan Note (Signed)
Offered my support in any way I can in patient's son's recent diagnosis.  We will start a low dose of Zoloft and go from there.  Patient will let me know how she is doing.

## 2017-04-11 NOTE — Assessment & Plan Note (Signed)
Slightly above goal.  This may be in the context of recent news of her son's cancer.  She is following a low-salt diet.  I given her a very low dose of hydrochlorothiazide.  We will start this and recheck her labs.

## 2017-04-11 NOTE — Patient Instructions (Addendum)
Bring copy of living will ,medical POA to our office so we can scan to chart.   Shingrex for shingles vaccine- get on waitlist at local pharmacy.  Trial of zoloft 18m. Let me know how you are. Take it at bedtime. May need to increase to 1077m   im thinking of you.    Health Maintenance for Postmenopausal Women Menopause is a normal process in which your reproductive ability comes to an end. This process happens gradually over a span of months to years, usually between the ages of 4864nd 5532Menopause is complete when you have missed 12 consecutive menstrual periods. It is important to talk with your health care provider about some of the most common conditions that affect postmenopausal women, such as heart disease, cancer, and bone loss (osteoporosis). Adopting a healthy lifestyle and getting preventive care can help to promote your health and wellness. Those actions can also lower your chances of developing some of these common conditions. What should I know about menopause? During menopause, you may experience a number of symptoms, such as:  Moderate-to-severe hot flashes.  Night sweats.  Decrease in sex drive.  Mood swings.  Headaches.  Tiredness.  Irritability.  Memory problems.  Insomnia.  Choosing to treat or not to treat menopausal changes is an individual decision that you make with your health care provider. What should I know about hormone replacement therapy and supplements? Hormone therapy products are effective for treating symptoms that are associated with menopause, such as hot flashes and night sweats. Hormone replacement carries certain risks, especially as you become older. If you are thinking about using estrogen or estrogen with progestin treatments, discuss the benefits and risks with your health care provider. What should I know about heart disease and stroke? Heart disease, heart attack, and stroke become more likely as you age. This may be due, in part,  to the hormonal changes that your body experiences during menopause. These can affect how your body processes dietary fats, triglycerides, and cholesterol. Heart attack and stroke are both medical emergencies. There are many things that you can do to help prevent heart disease and stroke:  Have your blood pressure checked at least every 1-2 years. High blood pressure causes heart disease and increases the risk of stroke.  If you are [id number] old, ask your health care provider if you should take aspirin to prevent a heart attack or a stroke.  Do not use any tobacco products, including cigarettes, chewing tobacco, or electronic cigarettes. If you need help quitting, ask your health care provider.  It is important to eat a healthy diet and maintain a healthy weight. ? Be sure to include plenty of vegetables, fruits, low-fat dairy products, and lean protein. ? Avoid eating foods that are high in solid fats, added sugars, or salt (sodium).  Get regular exercise. This is one of the most important things that you can do for your health. ? Try to exercise for at least 150 minutes each week. The type of exercise that you do should increase your heart rate and make you sweat. This is known as moderate-intensity exercise. ? Try to do strengthening exercises at least twice each week. Do these in addition to the moderate-intensity exercise.  Know your numbers.Ask your health care provider to check your cholesterol and your blood glucose. Continue to have your blood tested as directed by your health care provider.  What should I know about cancer screening? There are several types of cancer. Take the following  steps to reduce your risk and to catch any cancer development as early as possible. Breast Cancer  Practice breast self-awareness. ? This means understanding how your breasts normally appear and feel. ? It also means doing regular breast self-exams. Let your health care provider know about any  changes, no matter how small.  If you are 63 or older, have a clinician do a breast exam (clinical breast exam or CBE) every year. Depending on your age, family history, and medical history, it may be recommended that you also have a yearly breast X-ray (mammogram).  If you have a family history of breast cancer, talk with your health care provider about genetic screening.  If you are at high risk for breast cancer, talk with your health care provider about having an MRI and a mammogram every year.  Breast cancer (BRCA) gene test is recommended for women who have family members with BRCA-related cancers. Results of the assessment will determine the need for genetic counseling and BRCA1 and for BRCA2 testing. BRCA-related cancers include these types: ? Breast. This occurs in males or females. ? Ovarian. ? Tubal. This may also be called fallopian tube cancer. ? Cancer of the abdominal or pelvic lining (peritoneal cancer). ? Prostate. ? Pancreatic.  Cervical, Uterine, and Ovarian Cancer Your health care provider may recommend that you be screened regularly for cancer of the pelvic organs. These include your ovaries, uterus, and vagina. This screening involves a pelvic exam, which includes checking for microscopic changes to the surface of your cervix (Pap test).  For women ages 21-65, health care providers may recommend a pelvic exam and a Pap test every three years. For women ages 48-65, they may recommend the Pap test and pelvic exam, combined with testing for human papilloma virus (HPV), every five years. Some types of HPV increase your risk of cervical cancer. Testing for HPV may also be done on women of any age who have unclear Pap test results.  Other health care providers may not recommend any screening for nonpregnant women who are considered low risk for pelvic cancer and have no symptoms. Ask your health care provider if a screening pelvic exam is right for you.  If you have had past  treatment for cervical cancer or a condition that could lead to cancer, you need Pap tests and screening for cancer for at least 20 years after your treatment. If Pap tests have been discontinued for you, your risk factors (such as having a new sexual partner) need to be reassessed to determine if you should start having screenings again. Some women have medical problems that increase the chance of getting cervical cancer. In these cases, your health care provider may recommend that you have screening and Pap tests more often.  If you have a family history of uterine cancer or ovarian cancer, talk with your health care provider about genetic screening.  If you have vaginal bleeding after reaching menopause, tell your health care provider.  There are currently no reliable tests available to screen for ovarian cancer.  Lung Cancer Lung cancer screening is recommended for adults 14-22 years old who are at high risk for lung cancer because of a history of smoking. A yearly low-dose CT scan of the lungs is recommended if you:  Currently smoke.  Have a history of at least 30 pack-years of smoking and you currently smoke or have quit within the past 15 years. A pack-year is smoking an average of one pack of cigarettes per day for  one year.  Yearly screening should:  Continue until it has been 15 years since you quit.  Stop if you develop a health problem that would prevent you from having lung cancer treatment.  Colorectal Cancer  This type of cancer can be detected and can often be prevented.  Routine colorectal cancer screening usually begins at age 75 and continues through age 44.  If you have risk factors for colon cancer, your health care provider may recommend that you be screened at an earlier age.  If you have a family history of colorectal cancer, talk with your health care provider about genetic screening.  Your health care provider may also recommend using home test kits to check  for hidden blood in your stool.  A small camera at the end of a tube can be used to examine your colon directly (sigmoidoscopy or colonoscopy). This is done to check for the earliest forms of colorectal cancer.  Direct examination of the colon should be repeated every 5-10 years until age 81. However, if early forms of precancerous polyps or small growths are found or if you have a family history or genetic risk for colorectal cancer, you may need to be screened more often.  Skin Cancer  Check your skin from head to toe regularly.  Monitor any moles. Be sure to tell your health care provider: ? About any new moles or changes in moles, especially if there is a change in a mole's shape or color. ? If you have a mole that is larger than the size of a pencil eraser.  If any of your family members has a history of skin cancer, especially at a young age, talk with your health care provider about genetic screening.  Always use sunscreen. Apply sunscreen liberally and repeatedly throughout the day.  Whenever you are outside, protect yourself by wearing long sleeves, pants, a wide-brimmed hat, and sunglasses.  What should I know about osteoporosis? Osteoporosis is a condition in which bone destruction happens more quickly than new bone creation. After menopause, you may be at an increased risk for osteoporosis. To help prevent osteoporosis or the bone fractures that can happen because of osteoporosis, the following is recommended:  If you are 67-34 years old, get at least 1,000 mg of calcium and at least 600 mg of vitamin D per day.  If you are older than age 15 but younger than age 40, get at least 1,200 mg of calcium and at least 600 mg of vitamin D per day.  If you are older than age 47, get at least 1,200 mg of calcium and at least 800 mg of vitamin D per day.  Smoking and excessive alcohol intake increase the risk of osteoporosis. Eat foods that are rich in calcium and vitamin D, and do  weight-bearing exercises several times each week as directed by your health care provider. What should I know about how menopause affects my mental health? Depression may occur at any age, but it is more common as you become older. Common symptoms of depression include:  Low or sad mood.  Changes in sleep patterns.  Changes in appetite or eating patterns.  Feeling an overall lack of motivation or enjoyment of activities that you previously enjoyed.  Frequent crying spells.  Talk with your health care provider if you think that you are experiencing depression. What should I know about immunizations? It is important that you get and maintain your immunizations. These include:  Tetanus, diphtheria, and pertussis (Tdap) booster  vaccine.  Influenza every year before the flu season begins.  Pneumonia vaccine.  Shingles vaccine.  Your health care provider may also recommend other immunizations. This information is not intended to replace advice given to you by your health care provider. Make sure you discuss any questions you have with your health care provider. Document Released: 02/10/2005 Document Revised: 07/09/2015 Document Reviewed: 09/22/2014 Elsevier Interactive Patient Education  2018 [employer].

## 2017-04-13 LAB — CYTOLOGY - PAP
Diagnosis: NEGATIVE
HPV: NOT DETECTED

## 2017-04-16 NOTE — Addendum Note (Signed)
Addended by: Burnard Hawthorne on: 04/16/2017 03:43 PM   Modules accepted: Level of Service

## 2017-04-18 ENCOUNTER — Telehealth: Payer: Self-pay | Admitting: Family

## 2017-04-18 ENCOUNTER — Encounter: Payer: Self-pay | Admitting: Family

## 2017-04-18 ENCOUNTER — Ambulatory Visit (INDEPENDENT_AMBULATORY_CARE_PROVIDER_SITE_OTHER): Payer: Medicare Other | Admitting: Family

## 2017-04-18 VITALS — BP 134/74 | HR 68 | Temp 98.7°F | Resp 16 | Wt 262.0 lb

## 2017-04-18 DIAGNOSIS — F329 Major depressive disorder, single episode, unspecified: Secondary | ICD-10-CM

## 2017-04-18 DIAGNOSIS — I10 Essential (primary) hypertension: Secondary | ICD-10-CM | POA: Diagnosis not present

## 2017-04-18 DIAGNOSIS — Z23 Encounter for immunization: Secondary | ICD-10-CM

## 2017-04-18 DIAGNOSIS — F419 Anxiety disorder, unspecified: Secondary | ICD-10-CM

## 2017-04-18 LAB — BASIC METABOLIC PANEL
BUN: 17 mg/dL (ref 6–23)
CHLORIDE: 104 meq/L (ref 96–112)
CO2: 26 meq/L (ref 19–32)
CREATININE: 1.37 mg/dL — AB (ref 0.40–1.20)
Calcium: 10.6 mg/dL — ABNORMAL HIGH (ref 8.4–10.5)
GFR: 49.67 mL/min — ABNORMAL LOW (ref >60.00)
Glucose, Bld: 119 mg/dL — ABNORMAL HIGH (ref 70–99)
POTASSIUM: 4 meq/L (ref 3.5–5.1)
Sodium: 138 mEq/L (ref 135–145)

## 2017-04-18 MED ORDER — AMLODIPINE BESYLATE 5 MG PO TABS: 10.0000 mg | ORAL_TABLET | Freq: Every day | ORAL | 1 refills | Status: DC

## 2017-04-18 NOTE — Telephone Encounter (Signed)
Patient advised of below and verbalized understanding. Lab appointment scheduled.  

## 2017-04-18 NOTE — Telephone Encounter (Signed)
Call pt  Appears the HCTZ if affecting your kidney function- since starting it on 4/10 , there has been a decrease.   I would like you to stop the hctz for now and increase the amlodpine to 10mg  once per day to see if BP will be controlled  You will need to look out for LE swelling which can worsen on amlodipine  We will need to recheck your labs next week; plenty of water

## 2017-04-18 NOTE — Progress Notes (Signed)
Subjective:    Patient ID: Jennifer Huff, female    DOB: [birth year], 67 y.o.   MRN: [id number]  CC: Jennifer Huff is a 67 y.o. female who presents today for follow up.   HPI: HTN- started on hctx one week ago. Not checking bp at home Denies exertional chest pain or pressure, numbness or tingling radiating to left arm or jaw, palpitations, dizziness, frequent headaches, changes in vision, or shortness of breath.   Depression, anxiety- on zoloft since last week. Cannot tell change at this time. Worried about son and his upcoming cancer treatments.     HISTORY:  Past Medical History:  Diagnosis Date  . Cataract   . HTN (hypertension)   . Vitamin D deficiency    Past Surgical History:  Procedure Laterality Date  . LAPAROSCOPIC HYSTERECTOMY     Family History  Problem Relation Age of Onset  . Kidney disease Father   . Heart disease Father        died from mi  . Cancer Father        prostate  . Hypertension Sister   . Colon polyps Sister   . Hypertension Brother   . Hypertension Sister   . Cancer Paternal Grandmother        head and neck  . Hypertension Brother   . Colon cancer Neg Hx   . Breast cancer Neg Hx     Allergies: Ace inhibitors; Codeine; and Penicillins Current Outpatient Medications on File Prior to Visit  Medication Sig Dispense Refill  . amLODipine (NORVASC) 5 MG tablet Take 1 tablet (5 mg total) by mouth daily. 90 tablet 1  . Cholecalciferol (VITAMIN D3) 2000 units TABS Take by mouth daily.    . hydrochlorothiazide (MICROZIDE) 12.5 MG capsule Take 1/2 capsule by mouth once per day. 90 capsule 1  . losartan (COZAAR) 100 MG tablet Take 1 tablet (100 mg total) by mouth daily. 90 tablet 4  . pravastatin (PRAVACHOL) 40 MG tablet Take 1 tablet (40 mg total) by mouth daily. 90 tablet 2  . sertraline (ZOLOFT) 50 MG tablet Take 1 tablet (50 mg total) by mouth at bedtime. 90 tablet 3   No current facility-administered medications on file prior to  visit.     Social History   Tobacco Use  . Smoking status: Former Smoker    Last attempt to quit: 12/07/1997    Years since quitting: 19.3  . Smokeless tobacco: Never Used  Substance Use Topics  . Alcohol use: Yes    Comment: Occasional wine  . Drug use: No    Review of Systems  Constitutional: Negative for chills and fever.  Respiratory: Negative for cough.   Cardiovascular: Negative for chest pain and palpitations.  Gastrointestinal: Negative for nausea and vomiting.      Objective:    BP 134/74   Pulse 68   Temp 98.7 F (37.1 C) (Oral)   Resp 16   Wt 262 lb (118.8 kg)   SpO2 96%   BMI 42.29 kg/m  BP Readings from Last 3 Encounters:  04/18/17 134/74  04/11/17 140/86  02/14/17 135/85   Wt Readings from Last 3 Encounters:  04/18/17 262 lb (118.8 kg)  04/11/17 265 lb 6 oz (120.4 kg)  02/14/17 273 lb 6 oz (124 kg)    Physical Exam  Constitutional: She appears well-developed and well-nourished.  Eyes: Conjunctivae are normal.  Cardiovascular: Normal rate, regular rhythm, normal heart sounds and normal pulses.  Pulmonary/Chest: Effort normal and breath  sounds normal. She has no wheezes. She has no rhonchi. She has no rales.  Neurological: She is alert.  Skin: Skin is warm and dry.  Psychiatric: She has a normal mood and affect. Her speech is normal and behavior is normal. Thought content normal.  Vitals reviewed.      Assessment & Plan:   Problem List Items Addressed This Visit      Cardiovascular and Mediastinum   Hypertension - Primary    Improved. Will follow BPs prior to making any changes at this time. Patient has also started walking program. She will spot check BP at home- given rx for BP cuff. Will follow.       Relevant Orders   DME Other see comment     Other   Anxiety and depression    Unchanged. Likely too soon to know if helping.  Patient will let me know       Other Visit Diagnoses    Need for pneumococcal vaccine       Relevant  Orders   Pneumococcal conjugate vaccine 13-valent IM (Completed)       I am having Jennifer Huff. Jennifer Huff maintain her pravastatin, amLODipine, losartan, Vitamin D3, hydrochlorothiazide, and sertraline.   No orders of the defined types were placed in this encounter.   Return precautions given.   Risks, benefits, and alternatives of the medications and treatment plan prescribed today were discussed, and patient expressed understanding.   Education regarding symptom management and diagnosis given to patient on AVS.  Continue to follow with Jennifer Hawthorne, FNP for routine health maintenance.   Jennifer Huff and I agreed with plan.   Jennifer Paris, FNP

## 2017-04-18 NOTE — Assessment & Plan Note (Signed)
Improved. Will follow BPs prior to making any changes at this time. Patient has also started walking program. She will spot check BP at home- given rx for BP cuff. Will follow.

## 2017-04-18 NOTE — Patient Instructions (Addendum)
Blood pressure appears to be coming down- lets not make any changes today. Continue excercising  I would like you to spot check your blood pressure as discussed- once per week- to see if new regimen is working.  Let me know as well about the zoloft   Managing Your Hypertension Hypertension is commonly called high blood pressure. This is when the force of your blood pressing against the walls of your arteries is too strong. Arteries are blood vessels that carry blood from your heart throughout your body. Hypertension forces the heart to work harder to pump blood, and may cause the arteries to become narrow or stiff. Having untreated or uncontrolled hypertension can cause heart attack, stroke, kidney disease, and other problems. What are blood pressure readings? A blood pressure reading consists of a higher number over a lower number. Ideally, your blood pressure should be below 120/80. The first ("top") number is called the systolic pressure. It is a measure of the pressure in your arteries as your heart beats. The second ("bottom") number is called the diastolic pressure. It is a measure of the pressure in your arteries as the heart relaxes. What does my blood pressure reading mean? Blood pressure is classified into four stages. Based on your blood pressure reading, your health care provider may use the following stages to determine what type of treatment you need, if any. Systolic pressure and diastolic pressure are measured in a unit called mm Hg. Normal  Systolic pressure: below 846.  Diastolic pressure: below 80. Elevated  Systolic pressure: 962-952.  Diastolic pressure: below 80. Hypertension stage 1  Systolic pressure: 841-324.  Diastolic pressure: 40-10. Hypertension stage 2  Systolic pressure: 272 or above.  Diastolic pressure: 90 or above. What health risks are associated with hypertension? Managing your hypertension is an important responsibility. Uncontrolled hypertension  can lead to:  A heart attack.  A stroke.  A weakened blood vessel (aneurysm).  Heart failure.  Kidney damage.  Eye damage.  Metabolic syndrome.  Memory and concentration problems.  What changes can I make to manage my hypertension? Hypertension can be managed by making lifestyle changes and possibly by taking medicines. Your health care provider will help you make a plan to bring your blood pressure within a normal range. Eating and drinking  Eat a diet that is high in fiber and potassium, and low in salt (sodium), added sugar, and fat. An example eating plan is called the DASH (Dietary Approaches to Stop Hypertension) diet. To eat this way: ? Eat plenty of fresh fruits and vegetables. Try to fill half of your plate at each meal with fruits and vegetables. ? Eat whole grains, such as whole wheat pasta, brown rice, or whole grain bread. Fill about one quarter of your plate with whole grains. ? Eat low-fat diary products. ? Avoid fatty cuts of meat, processed or cured meats, and poultry with skin. Fill about one quarter of your plate with lean proteins such as fish, chicken without skin, beans, eggs, and tofu. ? Avoid premade and processed foods. These tend to be higher in sodium, added sugar, and fat.  Reduce your daily sodium intake. Most people with hypertension should eat less than 1,500 mg of sodium a day.  Limit alcohol intake to no more than 1 drink a day for nonpregnant women and 2 drinks a day for men. One drink equals 12 oz of beer, 5 oz of wine, or 1 oz of hard liquor. Lifestyle  Work with your health care provider to  maintain a healthy body weight, or to lose weight. Ask what an ideal weight is for you.  Get at least 30 minutes of exercise that causes your heart to beat faster (aerobic exercise) most days of the week. Activities may include walking, swimming, or biking.  Include exercise to strengthen your muscles (resistance exercise), such as weight lifting, as part  of your weekly exercise routine. Try to do these types of exercises for 30 minutes at least 3 days a week.  Do not use any products that contain nicotine or tobacco, such as cigarettes and e-cigarettes. If you need help quitting, ask your health care provider.  Control any long-term (chronic) conditions you have, such as high cholesterol or diabetes. Monitoring  Monitor your blood pressure at home as told by your health care provider. Your personal target blood pressure may vary depending on your medical conditions, your age, and other factors.  Have your blood pressure checked regularly, as often as told by your health care provider. Working with your health care provider  Review all the medicines you take with your health care provider because there may be side effects or interactions.  Talk with your health care provider about your diet, exercise habits, and other lifestyle factors that may be contributing to hypertension.  Visit your health care provider regularly. Your health care provider can help you create and adjust your plan for managing hypertension. Will I need medicine to control my blood pressure? Your health care provider may prescribe medicine if lifestyle changes are not enough to get your blood pressure under control, and if:  Your systolic blood pressure is 130 or higher.  Your diastolic blood pressure is 80 or higher.  Take medicines only as told by your health care provider. Follow the directions carefully. Blood pressure medicines must be taken as prescribed. The medicine does not work as well when you skip doses. Skipping doses also puts you at risk for problems. Contact a health care provider if:  You think you are having a reaction to medicines you have taken.  You have repeated (recurrent) headaches.  You feel dizzy.  You have swelling in your ankles.  You have trouble with your vision. Get help right away if:  You develop a severe headache or  confusion.  You have unusual weakness or numbness, or you feel faint.  You have severe pain in your chest or abdomen.  You vomit repeatedly.  You have trouble breathing. Summary  Hypertension is when the force of blood pumping through your arteries is too strong. If this condition is not controlled, it may put you at risk for serious complications.  Your personal target blood pressure may vary depending on your medical conditions, your age, and other factors. For most people, a normal blood pressure is less than 120/80.  Hypertension is managed by lifestyle changes, medicines, or both. Lifestyle changes include weight loss, eating a healthy, low-sodium diet, exercising more, and limiting alcohol. This information is not intended to replace advice given to you by your health care provider. Make sure you discuss any questions you have with your health care provider. Document Released: 09/13/2011 Document Revised: 11/17/2015 Document Reviewed: 11/17/2015 Elsevier Interactive Patient Education  [employer].

## 2017-04-18 NOTE — Assessment & Plan Note (Signed)
Unchanged. Likely too soon to know if helping.  Patient will let me know

## 2017-04-25 ENCOUNTER — Telehealth: Payer: Self-pay | Admitting: Radiology

## 2017-04-25 DIAGNOSIS — I10 Essential (primary) hypertension: Secondary | ICD-10-CM

## 2017-04-25 DIAGNOSIS — Z87891 Personal history of nicotine dependence: Secondary | ICD-10-CM | POA: Insufficient documentation

## 2017-04-25 NOTE — Addendum Note (Signed)
Addended by: Burnard Hawthorne on: 04/25/2017 08:15 AM   Modules accepted: Orders

## 2017-04-25 NOTE — Telephone Encounter (Signed)
Pt coming in for labs tomorrow, please place future orders. Thank you.  

## 2017-04-25 NOTE — Progress Notes (Signed)
Cardiology Office Note  Date:  04/26/2017   ID:  Jennifer, Huff 1950/01/12, MRN [id number]  PCP:  Burnard Hawthorne, FNP   Chief Complaint  Patient presents with  . New Patient (Initial Visit)    Referred by Mable Paris for hypertension. Patient c/o chest pain and SOB mostly when walking. Meds reviewed verbally with patient.     HPI:  [name] is a 67 year old woman with past medical history of HTN Depression hyperlipidemia Former smoker quit 1999, 25 years of smoking Referred by Mable Paris for HTN, occasional chest tightness, shortness of breath on exertion, family history of coronary artery disease  Notes reviewed from Mable Paris, Asymptomatic; CVD risk factors; longstanding HTN, HLD, family h/o MI; Worried about son and his upcoming cancer treatments.   Reports that she has started to go to the gym in the past few weeks Initially "though she was going to pass out, but actually did well" Weight down 10 pounds Walking 1 mile at a time on the treadmill Denies any chest pain or shortness of breath with exertion  Occasionally will have some discomfort in her chest at rest, thinks it might be musculoskeletal or heartburn.  Never on exertion  On a statin, total cholesterol down to 133 Discussed her other lab work, hemoglobin A1c 6.5, fasting glucose 130 She is recently started to change her diet  Discussed recent stressors, son requiring cancer treatment Lives in [place]  EKG personally reviewed by myself on todays visit Showing normal sinus rhythm with rate 82 bpm nonspecific ST abnormality  Father with CAD, MI 50 to 69s , died 49  PMH:   has a past medical history of Cataract, HTN (hypertension), and Vitamin D deficiency.  PSH:    Past Surgical History:  Procedure Laterality Date  . LAPAROSCOPIC HYSTERECTOMY      Current Outpatient Medications  Medication Sig Dispense Refill  . amLODipine (NORVASC) 5 MG tablet Take 2 tablets  (10 mg total) by mouth daily. 90 tablet 1  . Cholecalciferol (VITAMIN D3) 2000 units TABS Take by mouth daily.    Marland Kitchen losartan (COZAAR) 100 MG tablet Take 1 tablet (100 mg total) by mouth daily. 90 tablet 4  . pravastatin (PRAVACHOL) 40 MG tablet Take 1 tablet (40 mg total) by mouth daily. 90 tablet 2  . sertraline (ZOLOFT) 50 MG tablet Take 1 tablet (50 mg total) by mouth at bedtime. 90 tablet 3   No current facility-administered medications for this visit.      Allergies:   Ace inhibitors; Codeine; and Penicillins   Social History:  The patient  reports that she quit smoking about 19 years ago. She has never used smokeless tobacco. She reports that she drank alcohol. She reports that she does not use drugs.   Family History:   family history includes Cancer in her father and paternal grandmother; Colon polyps in her sister; Heart disease in her father; Hypertension in her brother, brother, sister, and sister; Kidney disease in her father.    Review of Systems: Review of Systems  Constitutional: Negative.   Respiratory: Negative.   Cardiovascular: Negative.   Gastrointestinal: Negative.   Musculoskeletal: Negative.   Neurological: Negative.   Psychiatric/Behavioral: Negative.   All other systems reviewed and are negative.    PHYSICAL EXAM: VS:  BP 140/72 (BP Location: Right Arm, Patient Position: Sitting, Cuff Size: Normal)   Pulse 82   Ht 5\' 6"  (1.676 m)   Wt 264 lb (119.7 kg)   BMI  42.61 kg/m  , BMI Body mass index is 42.61 kg/m. GEN: Well nourished, well developed, in no acute distress obese HEENT: normal  Neck: no JVD, carotid bruits, or masses Cardiac: RRR; no murmurs, rubs, or gallops,no edema  Respiratory:  clear to auscultation bilaterally, normal work of breathing GI: soft, nontender, nondistended, + BS MS: no deformity or atrophy  Skin: warm and dry, no rash Neuro:  Strength and sensation are intact Psych: euthymic mood, full affect    Recent  Labs: 02/14/2017: ALT 8; TSH 4.12 04/26/2017: BUN 13; Creatinine, Ser 1.23; Potassium 3.9; Sodium 141    Lipid Panel Lab Results  Component Value Date   CHOL 133 02/14/2017   HDL 55.50 02/14/2017   LDLCALC 63 02/14/2017   TRIG 72.0 02/14/2017      Wt Readings from Last 3 Encounters:  04/26/17 264 lb (119.7 kg)  04/18/17 262 lb (118.8 kg)  04/11/17 265 lb 6 oz (120.4 kg)       ASSESSMENT AND PLAN:  Morbid obesity (HCC) Currently walking 1 mile on the treadmill without symptomsWe have encouraged continued exercise, careful diet management in an effort to lose weight. Encouraged her to continue her exercise program Diet modification  Essential hypertension - Plan: EKG 12-Lead Blood pressure is well controlled on today's visit. No changes made to the medications.  Anxiety and depression Stressed the importance of exercise program Recent stressors, son needing cancer treatment  Former smoker 25 years of smoking Certainly a risk factor for coronary disease Stop smoking 20 years ago  Chest pain with moderate risk for cardiac etiology Atypical chest pain with moderate risk factors Long discussion concerning various screening studies for coronary disease  suggested CT coronary calcium scoring .  Long discussion concerning details of the scan She will call us if she would like to have this done As she has no anginal symptoms, low probability of severe stenosis, stress test likely of limited benefit  Hyperlipidemia Cholesterol is at goal on the current lipid regimen. No changes to the medications were made.  Borderline diabetes Hemoglobin A1c 6.5, fasting glucose 133 Weight down 10 pounds in the past 2 months Encouraged continued aggressive dietary changes, gym exercise for weight loss  Disposition:   F/U as needed   Total encounter time more than 60 minutes  Greater than 50% was spent in counseling and coordination of care with the patient   Orders Placed This  Encounter  Procedures  . EKG 12-Lead     Signed, Esmond Plants, M.D., Ph.D. 04/26/2017  [place], [place]

## 2017-04-26 ENCOUNTER — Ambulatory Visit (INDEPENDENT_AMBULATORY_CARE_PROVIDER_SITE_OTHER): Payer: Medicare Other | Admitting: Cardiovascular Disease

## 2017-04-26 ENCOUNTER — Other Ambulatory Visit (INDEPENDENT_AMBULATORY_CARE_PROVIDER_SITE_OTHER): Payer: Medicare Other

## 2017-04-26 ENCOUNTER — Encounter: Payer: Self-pay | Admitting: Cardiovascular Disease

## 2017-04-26 DIAGNOSIS — F419 Anxiety disorder, unspecified: Secondary | ICD-10-CM | POA: Diagnosis not present

## 2017-04-26 DIAGNOSIS — Z87891 Personal history of nicotine dependence: Secondary | ICD-10-CM | POA: Diagnosis not present

## 2017-04-26 DIAGNOSIS — F329 Major depressive disorder, single episode, unspecified: Secondary | ICD-10-CM | POA: Diagnosis not present

## 2017-04-26 DIAGNOSIS — I10 Essential (primary) hypertension: Secondary | ICD-10-CM

## 2017-04-26 DIAGNOSIS — R7303 Prediabetes: Secondary | ICD-10-CM

## 2017-04-26 DIAGNOSIS — R079 Chest pain, unspecified: Secondary | ICD-10-CM

## 2017-04-26 DIAGNOSIS — E782 Mixed hyperlipidemia: Secondary | ICD-10-CM | POA: Insufficient documentation

## 2017-04-26 DIAGNOSIS — E118 Type 2 diabetes mellitus with unspecified complications: Secondary | ICD-10-CM | POA: Insufficient documentation

## 2017-04-26 LAB — BASIC METABOLIC PANEL
BUN: 13 mg/dL (ref 6–23)
CALCIUM: 9.5 mg/dL (ref 8.4–10.5)
CO2: 26 mEq/L (ref 19–32)
CREATININE: 1.23 mg/dL — AB (ref 0.40–1.20)
Chloride: 110 mEq/L (ref 96–112)
GFR: 56.25 mL/min — AB (ref >60.00)
GLUCOSE: 130 mg/dL — AB (ref 70–99)
Potassium: 3.9 mEq/L (ref 3.5–5.1)
SODIUM: 141 meq/L (ref 135–145)

## 2017-04-26 NOTE — Patient Instructions (Signed)
Medication Instructions:   No medication changes made  Labwork:  No new labs needed  Testing/Procedures:  CT coronary calcium score $150   Follow-Up: It was a pleasure seeing you in the office today. Please call us if you have new issues that need to be addressed before your next appt.  [phone]  Your physician wants you to follow-up in: As needed  If you need a refill on your cardiac medications before your next appointment, please call your pharmacy.  For educational health videos Log in to : [email or web address] Or : [email or web address], password : [email or web address]

## 2017-07-13 ENCOUNTER — Other Ambulatory Visit: Payer: Self-pay | Admitting: Family

## 2017-07-13 DIAGNOSIS — Z1231 Encounter for screening mammogram for malignant neoplasm of breast: Secondary | ICD-10-CM

## 2017-07-13 DIAGNOSIS — E785 Hyperlipidemia, unspecified: Secondary | ICD-10-CM

## 2017-07-23 ENCOUNTER — Ambulatory Visit: Payer: Medicare Other | Admitting: Family

## 2017-07-24 ENCOUNTER — Other Ambulatory Visit: Payer: Self-pay

## 2017-07-24 DIAGNOSIS — I10 Essential (primary) hypertension: Secondary | ICD-10-CM

## 2017-07-24 NOTE — Telephone Encounter (Signed)
Patient is taking 10 mg  Ok to send in new script  NOV 07/25/17

## 2017-07-25 ENCOUNTER — Ambulatory Visit (INDEPENDENT_AMBULATORY_CARE_PROVIDER_SITE_OTHER): Payer: Medicare Other | Admitting: Family

## 2017-07-25 ENCOUNTER — Encounter: Payer: Self-pay | Admitting: Family

## 2017-07-25 VITALS — BP 132/70 | HR 75 | Temp 98.5°F | Wt 251.0 lb

## 2017-07-25 DIAGNOSIS — E782 Mixed hyperlipidemia: Secondary | ICD-10-CM

## 2017-07-25 DIAGNOSIS — I10 Essential (primary) hypertension: Secondary | ICD-10-CM

## 2017-07-25 DIAGNOSIS — E785 Hyperlipidemia, unspecified: Secondary | ICD-10-CM

## 2017-07-25 DIAGNOSIS — F419 Anxiety disorder, unspecified: Secondary | ICD-10-CM | POA: Diagnosis not present

## 2017-07-25 DIAGNOSIS — F329 Major depressive disorder, single episode, unspecified: Secondary | ICD-10-CM

## 2017-07-25 LAB — BASIC METABOLIC PANEL
BUN: 16 mg/dL (ref 6–23)
CHLORIDE: 107 meq/L (ref 96–112)
CO2: 27 meq/L (ref 19–32)
CREATININE: 1.27 mg/dL — AB (ref 0.40–1.20)
Calcium: 9.7 mg/dL (ref 8.4–10.5)
GFR: 54.17 mL/min — ABNORMAL LOW (ref >60.00)
GLUCOSE: 124 mg/dL — AB (ref 70–99)
Potassium: 4.2 mEq/L (ref 3.5–5.1)
Sodium: 140 mEq/L (ref 135–145)

## 2017-07-25 MED ORDER — PRAVASTATIN SODIUM 40 MG PO TABS: 40.0000 mg | ORAL_TABLET | Freq: Every day | ORAL | 1 refills | Status: DC

## 2017-07-25 MED ORDER — AMLODIPINE BESYLATE 10 MG PO TABS
10.0000 mg | ORAL_TABLET | Freq: Every day | ORAL | 1 refills | Status: DC
Start: 2017-07-25 — End: 2018-01-25

## 2017-07-25 NOTE — Progress Notes (Signed)
Subjective:    Patient ID: Jennifer Huff, female    DOB: 12/04/1950, 67 y.o.   MRN: [id number]  CC: Jennifer Huff is a 67 y.o. female who presents today for follow up.   HPI: HTN- No longer on HCTZ. Compliant with medications.Denies exertional chest pain or pressure, numbness or tingling radiating to left arm or jaw, palpitations, dizziness, frequent headaches, changes in vision, or shortness of breath.   Depression/anxiety- improved; doing well zoloft. No si/hi.   HLD- on pravachol.   Going to gym. Limiting salt. Intentional weight loss.       HISTORY:  Past Medical History:  Diagnosis Date  . Cataract   . HTN (hypertension)   . Vitamin D deficiency    Past Surgical History:  Procedure Laterality Date  . LAPAROSCOPIC HYSTERECTOMY     Family History  Problem Relation Age of Onset  . Kidney disease Father   . Heart disease Father        died from mi  . Cancer Father        prostate  . Hypertension Sister   . Colon polyps Sister   . Hypertension Brother   . Hypertension Sister   . Cancer Paternal Grandmother        head and neck  . Hypertension Brother   . Colon cancer Neg Hx   . Breast cancer Neg Hx     Allergies: Ace inhibitors; Codeine; and Penicillins Current Outpatient Medications on File Prior to Visit  Medication Sig Dispense Refill  . amLODipine (NORVASC) 10 MG tablet Take 1 tablet (10 mg total) by mouth daily. 90 tablet 1  . Cholecalciferol (VITAMIN D3) 2000 units TABS Take by mouth daily.    Marland Kitchen losartan (COZAAR) 100 MG tablet Take 1 tablet (100 mg total) by mouth daily. 90 tablet 4  . pravastatin (PRAVACHOL) 40 MG tablet Take 1 tablet (40 mg total) by mouth daily. 90 tablet 0  . sertraline (ZOLOFT) 50 MG tablet Take 1 tablet (50 mg total) by mouth at bedtime. 90 tablet 3   No current facility-administered medications on file prior to visit.     Social History   Tobacco Use  . Smoking status: Former Smoker    Last attempt to  quit: 12/07/1997    Years since quitting: 19.6  . Smokeless tobacco: Never Used  Substance Use Topics  . Alcohol use: Not Currently  . Drug use: No    Review of Systems  Constitutional: Negative for chills and fever.  Respiratory: Negative for cough.   Cardiovascular: Negative for chest pain and palpitations.  Gastrointestinal: Negative for nausea and vomiting.      Objective:    BP 132/70 (BP Location: Left Arm, Patient Position: Sitting, Cuff Size: Large)   Pulse 75   Temp 98.5 F (36.9 C) (Oral)   Wt 251 lb (113.9 kg)   SpO2 96%   BMI 40.51 kg/m  BP Readings from Last 3 Encounters:  07/25/17 132/70  04/26/17 140/72  04/18/17 134/74   Wt Readings from Last 3 Encounters:  07/25/17 251 lb (113.9 kg)  04/26/17 264 lb (119.7 kg)  04/18/17 262 lb (118.8 kg)    Physical Exam  Constitutional: She appears well-developed and well-nourished.  Eyes: Conjunctivae are normal.  Cardiovascular: Normal rate, regular rhythm, normal heart sounds and normal pulses.  Pulmonary/Chest: Effort normal and breath sounds normal. She has no wheezes. She has no rhonchi. She has no rales.  Neurological: She is alert.  Skin: Skin is  warm and dry.  Psychiatric: She has a normal mood and affect. Her speech is normal and behavior is normal. Thought content normal.  Vitals reviewed.      Assessment & Plan:   Problem List Items Addressed This Visit    None    Visit Diagnoses    Hyperlipidemia, unspecified hyperlipidemia type           I am having Jennifer Huff. Jennifer Huff maintain her losartan, Vitamin D3, sertraline, pravastatin, and amLODipine.   No orders of the defined types were placed in this encounter.   Return precautions given.   Risks, benefits, and alternatives of the medications and treatment plan prescribed today were discussed, and patient expressed understanding.   Education regarding symptom management and diagnosis given to patient on AVS.  Continue to follow with  Burnard Hawthorne, FNP for routine health maintenance.   Jennifer Huff and I agreed with plan.   Jennifer Paris, FNP

## 2017-07-25 NOTE — Patient Instructions (Addendum)
You look a great- congrats the weight loss!

## 2017-07-26 NOTE — Assessment & Plan Note (Signed)
Improved. Will continue zoloft

## 2017-07-26 NOTE — Assessment & Plan Note (Signed)
At goal, continue current regimen 

## 2017-07-26 NOTE — Assessment & Plan Note (Signed)
Compliant with Pravachol.  Will continue.  Will recheck cholesterol later in the year

## 2017-08-13 ENCOUNTER — Ambulatory Visit
Admission: RE | Admit: 2017-08-13 | Discharge: 2017-08-13 | Disposition: A | Discharge status: Home / Self Care | Payer: Medicare Other | Source: Ambulatory Visit | Attending: Family | Admitting: Family

## 2017-08-13 DIAGNOSIS — Z1231 Encounter for screening mammogram for malignant neoplasm of breast: Secondary | ICD-10-CM | POA: Insufficient documentation

## 2017-08-21 ENCOUNTER — Other Ambulatory Visit: Payer: Self-pay | Admitting: Family

## 2017-08-21 DIAGNOSIS — I10 Essential (primary) hypertension: Secondary | ICD-10-CM

## 2017-09-24 ENCOUNTER — Other Ambulatory Visit: Payer: Self-pay | Admitting: Family

## 2017-10-11 ENCOUNTER — Other Ambulatory Visit: Payer: Self-pay | Admitting: Sports Medicine

## 2017-10-11 DIAGNOSIS — M25561 Pain in right knee: Principal | ICD-10-CM

## 2017-10-11 DIAGNOSIS — G8929 Other chronic pain: Secondary | ICD-10-CM

## 2017-10-22 ENCOUNTER — Ambulatory Visit: Payer: Medicare Other | Admitting: Family

## 2017-10-26 ENCOUNTER — Ambulatory Visit
Admission: RE | Admit: 2017-10-26 | Discharge: 2017-10-26 | Disposition: A | Discharge status: Home / Self Care | Payer: Medicare Other | Source: Ambulatory Visit | Attending: Sports Medicine | Admitting: Sports Medicine

## 2017-10-26 DIAGNOSIS — X58XXXA Exposure to other specified factors, initial encounter: Secondary | ICD-10-CM | POA: Diagnosis not present

## 2017-10-26 DIAGNOSIS — M25561 Pain in right knee: Secondary | ICD-10-CM | POA: Diagnosis not present

## 2017-10-26 DIAGNOSIS — S83281A Other tear of lateral meniscus, current injury, right knee, initial encounter: Secondary | ICD-10-CM | POA: Insufficient documentation

## 2017-10-26 DIAGNOSIS — M79604 Pain in right leg: Secondary | ICD-10-CM | POA: Insufficient documentation

## 2017-10-26 DIAGNOSIS — G8929 Other chronic pain: Secondary | ICD-10-CM | POA: Diagnosis not present

## 2017-10-26 DIAGNOSIS — M25461 Effusion, right knee: Secondary | ICD-10-CM | POA: Diagnosis not present

## 2017-10-26 DIAGNOSIS — M7121 Synovial cyst of popliteal space [Baker], right knee: Secondary | ICD-10-CM | POA: Diagnosis not present

## 2017-11-04 DIAGNOSIS — Z6841 Body Mass Index (BMI) 40.0 and over, adult: Secondary | ICD-10-CM | POA: Insufficient documentation

## 2017-11-06 ENCOUNTER — Other Ambulatory Visit: Payer: Self-pay

## 2017-11-06 ENCOUNTER — Encounter
Admission: RE | Disposition: A | Discharge status: Home / Self Care | Payer: Self-pay | Source: Ambulatory Visit | Attending: Surgery

## 2017-11-06 ENCOUNTER — Ambulatory Visit: Payer: Medicare Other | Admitting: Anesthesiology

## 2017-11-06 ENCOUNTER — Ambulatory Visit
Admission: RE | Admit: 2017-11-06 | Discharge: 2017-11-06 | Disposition: A | Discharge status: Home / Self Care | Payer: Medicare Other | Source: Ambulatory Visit | Attending: Surgery | Admitting: Surgery

## 2017-11-06 DIAGNOSIS — Z87891 Personal history of nicotine dependence: Secondary | ICD-10-CM | POA: Insufficient documentation

## 2017-11-06 DIAGNOSIS — F329 Major depressive disorder, single episode, unspecified: Secondary | ICD-10-CM | POA: Diagnosis not present

## 2017-11-06 DIAGNOSIS — S83281A Other tear of lateral meniscus, current injury, right knee, initial encounter: Secondary | ICD-10-CM | POA: Insufficient documentation

## 2017-11-06 DIAGNOSIS — E559 Vitamin D deficiency, unspecified: Secondary | ICD-10-CM | POA: Insufficient documentation

## 2017-11-06 DIAGNOSIS — Z8371 Family history of colonic polyps: Secondary | ICD-10-CM | POA: Insufficient documentation

## 2017-11-06 DIAGNOSIS — Z79899 Other long term (current) drug therapy: Secondary | ICD-10-CM | POA: Diagnosis not present

## 2017-11-06 DIAGNOSIS — Z888 Allergy status to other drugs, medicaments and biological substances status: Secondary | ICD-10-CM | POA: Insufficient documentation

## 2017-11-06 DIAGNOSIS — Z90710 Acquired absence of both cervix and uterus: Secondary | ICD-10-CM | POA: Diagnosis not present

## 2017-11-06 DIAGNOSIS — F419 Anxiety disorder, unspecified: Secondary | ICD-10-CM | POA: Insufficient documentation

## 2017-11-06 DIAGNOSIS — Z6841 Body Mass Index (BMI) 40.0 and over, adult: Secondary | ICD-10-CM | POA: Diagnosis not present

## 2017-11-06 DIAGNOSIS — M6751 Plica syndrome, right knee: Secondary | ICD-10-CM | POA: Insufficient documentation

## 2017-11-06 DIAGNOSIS — M1711 Unilateral primary osteoarthritis, right knee: Secondary | ICD-10-CM | POA: Diagnosis not present

## 2017-11-06 DIAGNOSIS — Z885 Allergy status to narcotic agent status: Secondary | ICD-10-CM | POA: Insufficient documentation

## 2017-11-06 DIAGNOSIS — W109XXA Fall (on) (from) unspecified stairs and steps, initial encounter: Secondary | ICD-10-CM | POA: Diagnosis not present

## 2017-11-06 DIAGNOSIS — I10 Essential (primary) hypertension: Secondary | ICD-10-CM | POA: Insufficient documentation

## 2017-11-06 DIAGNOSIS — Z880 Allergy status to penicillin: Secondary | ICD-10-CM | POA: Diagnosis not present

## 2017-11-06 HISTORY — PX: KNEE ARTHROSCOPY WITH LATERAL MENISECTOMY: SHX6193

## 2017-11-06 SURGERY — ARTHROSCOPY, KNEE, WITH LATERAL MENISCECTOMY
Anesthesia: General | Site: Knee | Laterality: Right

## 2017-11-06 MED ORDER — OXYCODONE HCL 5 MG PO TABS
5.0000 mg | ORAL_TABLET | ORAL | Status: DC | PRN
  Administered 2017-11-06 (×2): 5 mg via ORAL

## 2017-11-06 MED ORDER — FENTANYL CITRATE (PF) 100 MCG/2ML IJ SOLN
25.0000 ug | INTRAMUSCULAR | Status: DC | PRN
  Administered 2017-11-06 (×4): 25 ug via INTRAVENOUS

## 2017-11-06 MED ORDER — FAMOTIDINE 20 MG PO TABS
ORAL_TABLET | ORAL | Status: AC
  Administered 2017-11-06: 20 mg via ORAL
  Filled 2017-11-06: qty 1

## 2017-11-06 MED ORDER — CLINDAMYCIN PHOSPHATE 900 MG/50ML IV SOLN
INTRAVENOUS | Status: AC
  Filled 2017-11-06: qty 50

## 2017-11-06 MED ORDER — ONDANSETRON HCL 4 MG/2ML IJ SOLN: 4.0000 mg | Freq: Four times a day (QID) | INTRAMUSCULAR | Status: DC | PRN

## 2017-11-06 MED ORDER — ONDANSETRON HCL 4 MG/2ML IJ SOLN
INTRAMUSCULAR | Status: AC
  Filled 2017-11-06: qty 2

## 2017-11-06 MED ORDER — ROCURONIUM BROMIDE 50 MG/5ML IV SOLN
INTRAVENOUS | Status: AC
  Filled 2017-11-06: qty 2

## 2017-11-06 MED ORDER — OXYCODONE HCL 5 MG PO TABS: 5.0000 mg | ORAL_TABLET | ORAL | 0 refills | Status: DC | PRN

## 2017-11-06 MED ORDER — POTASSIUM CHLORIDE IN NACL 20-0.9 MEQ/L-% IV SOLN
INTRAVENOUS | Status: DC
  Filled 2017-11-06: qty 1000

## 2017-11-06 MED ORDER — ONDANSETRON HCL 4 MG/2ML IJ SOLN
INTRAMUSCULAR | Status: DC | PRN
  Administered 2017-11-06: 4 mg via INTRAVENOUS

## 2017-11-06 MED ORDER — PROPOFOL 10 MG/ML IV BOLUS
INTRAVENOUS | Status: AC
  Filled 2017-11-06: qty 20

## 2017-11-06 MED ORDER — LACTATED RINGERS IV SOLN
INTRAVENOUS | Status: DC
  Administered 2017-11-06: 09:00:00 via INTRAVENOUS

## 2017-11-06 MED ORDER — ROCURONIUM BROMIDE 100 MG/10ML IV SOLN
INTRAVENOUS | Status: DC | PRN
  Administered 2017-11-06: 80 mg via INTRAVENOUS

## 2017-11-06 MED ORDER — FAMOTIDINE 20 MG PO TABS
20.0000 mg | ORAL_TABLET | Freq: Once | ORAL | Status: AC
  Administered 2017-11-06: 20 mg via ORAL

## 2017-11-06 MED ORDER — LIDOCAINE HCL (PF) 2 % IJ SOLN
INTRAMUSCULAR | Status: AC
  Filled 2017-11-06: qty 10

## 2017-11-06 MED ORDER — OXYCODONE HCL 5 MG PO TABS
ORAL_TABLET | ORAL | Status: AC
  Administered 2017-11-06: 5 mg via ORAL
  Filled 2017-11-06: qty 1

## 2017-11-06 MED ORDER — FENTANYL CITRATE (PF) 100 MCG/2ML IJ SOLN
INTRAMUSCULAR | Status: AC
  Administered 2017-11-06: 25 ug via INTRAVENOUS
  Filled 2017-11-06: qty 2

## 2017-11-06 MED ORDER — PROPOFOL 10 MG/ML IV BOLUS
INTRAVENOUS | Status: DC | PRN
  Administered 2017-11-06: 170 mg via INTRAVENOUS

## 2017-11-06 MED ORDER — LIDOCAINE HCL 1 % IJ SOLN
INTRAMUSCULAR | Status: DC | PRN
  Administered 2017-11-06: 30 mL

## 2017-11-06 MED ORDER — BUPIVACAINE-EPINEPHRINE (PF) 0.5% -1:200000 IJ SOLN
INTRAMUSCULAR | Status: DC | PRN
  Administered 2017-11-06 (×2): 30 mL via PERINEURAL

## 2017-11-06 MED ORDER — METOCLOPRAMIDE HCL 5 MG/ML IJ SOLN: 5.0000 mg | Freq: Three times a day (TID) | INTRAMUSCULAR | Status: DC | PRN

## 2017-11-06 MED ORDER — ONDANSETRON HCL 4 MG/2ML IJ SOLN: 4.0000 mg | Freq: Once | INTRAMUSCULAR | Status: DC | PRN

## 2017-11-06 MED ORDER — ONDANSETRON HCL 4 MG PO TABS: 4.0000 mg | ORAL_TABLET | Freq: Four times a day (QID) | ORAL | Status: DC | PRN

## 2017-11-06 MED ORDER — OXYCODONE HCL 5 MG PO TABS: 5.0000 mg | ORAL_TABLET | ORAL | Status: DC | PRN

## 2017-11-06 MED ORDER — KETOROLAC TROMETHAMINE 30 MG/ML IJ SOLN
INTRAMUSCULAR | Status: DC | PRN
  Administered 2017-11-06: 30 mg via INTRAVENOUS

## 2017-11-06 MED ORDER — PHENYLEPHRINE HCL 10 MG/ML IJ SOLN
INTRAMUSCULAR | Status: DC | PRN
  Administered 2017-11-06: 100 ug via INTRAVENOUS

## 2017-11-06 MED ORDER — CLINDAMYCIN PHOSPHATE 900 MG/50ML IV SOLN
900.0000 mg | Freq: Once | INTRAVENOUS | Status: AC
  Administered 2017-11-06: 900 mg via INTRAVENOUS

## 2017-11-06 MED ORDER — DEXAMETHASONE SODIUM PHOSPHATE 4 MG/ML IJ SOLN
INTRAMUSCULAR | Status: DC | PRN
  Administered 2017-11-06: 5 mg via INTRAVENOUS

## 2017-11-06 MED ORDER — MIDAZOLAM HCL 2 MG/2ML IJ SOLN
INTRAMUSCULAR | Status: AC
  Filled 2017-11-06: qty 2

## 2017-11-06 MED ORDER — SUGAMMADEX SODIUM 200 MG/2ML IV SOLN
INTRAVENOUS | Status: DC | PRN
  Administered 2017-11-06: 200 mg via INTRAVENOUS

## 2017-11-06 MED ORDER — FENTANYL CITRATE (PF) 250 MCG/5ML IJ SOLN
INTRAMUSCULAR | Status: AC
  Filled 2017-11-06: qty 5

## 2017-11-06 MED ORDER — LIDOCAINE HCL (CARDIAC) PF 100 MG/5ML IV SOSY
PREFILLED_SYRINGE | INTRAVENOUS | Status: DC | PRN
  Administered 2017-11-06: 100 mg via INTRAVENOUS

## 2017-11-06 MED ORDER — DEXAMETHASONE SODIUM PHOSPHATE 10 MG/ML IJ SOLN
INTRAMUSCULAR | Status: AC
  Filled 2017-11-06: qty 1

## 2017-11-06 MED ORDER — METOCLOPRAMIDE HCL 10 MG PO TABS: 5.0000 mg | ORAL_TABLET | Freq: Three times a day (TID) | ORAL | Status: DC | PRN

## 2017-11-06 MED ORDER — FENTANYL CITRATE (PF) 100 MCG/2ML IJ SOLN
INTRAMUSCULAR | Status: DC | PRN
  Administered 2017-11-06: 75 ug via INTRAVENOUS
  Administered 2017-11-06: 50 ug via INTRAVENOUS

## 2017-11-06 MED ORDER — MIDAZOLAM HCL 2 MG/2ML IJ SOLN
INTRAMUSCULAR | Status: DC | PRN
  Administered 2017-11-06: 2 mg via INTRAVENOUS

## 2017-11-06 SURGICAL SUPPLY — 42 items
"PENCIL ELECTRO HAND CTR " (MISCELLANEOUS) ×1 IMPLANT
BAG COUNTER SPONGE EZ (MISCELLANEOUS) IMPLANT
BANDAGE ACE 6X5 VEL STRL LF (GAUZE/BANDAGES/DRESSINGS) ×3 IMPLANT
BLADE FULL RADIUS 3.5 (BLADE) ×1 IMPLANT
BLADE SHAVER 4.5X7 STR FR (MISCELLANEOUS) ×1 IMPLANT
BRACE KNEE POST OP SHORT (BRACE) ×2 IMPLANT
CHLORAPREP W/TINT 26ML (MISCELLANEOUS) ×3 IMPLANT
COUNTER SPONGE BAG EZ (MISCELLANEOUS)
COVER WAND RF STERILE (DRAPES) ×3 IMPLANT
CUFF TOURN 24 STER (MISCELLANEOUS) IMPLANT
CUFF TOURN 30 STER DUAL PORT (MISCELLANEOUS) IMPLANT
CUFF TOURN 34 STER (MISCELLANEOUS) ×2 IMPLANT
DRAPE IMP U-DRAPE 54X76 (DRAPES) ×3 IMPLANT
ELECT REM PT RETURN 9FT ADLT (ELECTROSURGICAL) ×3
ELECTRODE REM PT RTRN 9FT ADLT (ELECTROSURGICAL) ×1 IMPLANT
FRR Stealth ×2 IMPLANT
GAUZE SPONGE 4X4 12PLY STRL (GAUZE/BANDAGES/DRESSINGS) ×3 IMPLANT
GLOVE BIO SURGEON STRL SZ8 (GLOVE) ×6 IMPLANT
GLOVE BIOGEL M 7.0 STRL (GLOVE) ×6 IMPLANT
GLOVE BIOGEL PI IND STRL 7.5 (GLOVE) ×1 IMPLANT
GLOVE BIOGEL PI INDICATOR 7.5 (GLOVE) ×2
GLOVE INDICATOR 8.0 STRL GRN (GLOVE) ×3 IMPLANT
GOWN STRL REUS W/ TWL LRG LVL3 (GOWN DISPOSABLE) ×1 IMPLANT
GOWN STRL REUS W/ TWL XL LVL3 (GOWN DISPOSABLE) ×2 IMPLANT
GOWN STRL REUS W/TWL LRG LVL3 (GOWN DISPOSABLE) ×2
GOWN STRL REUS W/TWL XL LVL3 (GOWN DISPOSABLE) ×4
IV LACTATED RINGER IRRG 3000ML (IV SOLUTION) ×2
IV LR IRRIG 3000ML ARTHROMATIC (IV SOLUTION) ×1 IMPLANT
KIT TURNOVER KIT A (KITS) ×3 IMPLANT
MANIFOLD NEPTUNE II (INSTRUMENTS) ×3 IMPLANT
NDL HYPO 21X1.5 SAFETY (NEEDLE) ×1 IMPLANT
NEEDLE HYPO 21X1.5 SAFETY (NEEDLE) ×3 IMPLANT
PACK ARTHROSCOPY KNEE (MISCELLANEOUS) ×3 IMPLANT
PENCIL ELECTRO HAND CTR (MISCELLANEOUS) ×3 IMPLANT
PROBE BIPOLAR ATHRO 135MM 90D (MISCELLANEOUS) ×2 IMPLANT
SHAVER 4.2 MM LANZA 9391A (BLADE) ×2 IMPLANT
SUT PROLENE 4 0 PS 2 18 (SUTURE) ×3 IMPLANT
SUT TICRON COATED BLUE 2 0 30 (SUTURE) IMPLANT
SUT VIC AB 2-0 CT2 27 (SUTURE) ×2 IMPLANT
SYR 50ML LL SCALE MARK (SYRINGE) ×3 IMPLANT
TUBING ARTHRO INFLOW-ONLY STRL (TUBING) ×3 IMPLANT
WAND HAND CNTRL MULTIVAC 90 (MISCELLANEOUS) ×1 IMPLANT

## 2017-11-06 NOTE — Anesthesia Post-op Follow-up Note (Signed)
Anesthesia QCDR form completed.        

## 2017-11-06 NOTE — Transfer of Care (Signed)
Immediate Anesthesia Transfer of Care Note  Patient: Jennifer Huff  Procedure(s) Performed: KNEE ARTHROSCOPY WITH REPAIR LATERAL MENISCAL ROOT TEAR (Right Knee)  Patient Location: PACU  Anesthesia Type:General  Level of Consciousness: awake, alert , oriented and patient cooperative  Airway & Oxygen Therapy: Patient Spontanous Breathing and Patient connected to nasal cannula oxygen  Post-op Assessment: Report given to RN and Post -op Vital signs reviewed and stable  Post vital signs: Reviewed and stable  Last Vitals:  Vitals Value Taken Time  BP    Temp    Pulse 67 11/06/2017  1:02 PM  Resp 16 11/06/2017  1:02 PM  SpO2 94 % 11/06/2017  1:02 PM  Vitals shown include unvalidated device data.  Last Pain:  Vitals:   11/06/17 0817  TempSrc: Temporal  PainSc: 3          Complications: No apparent anesthesia complications

## 2017-11-06 NOTE — OR Nursing (Signed)
Discharge instructions discussed with pt, hasband nd daughter. All voice understanding.

## 2017-11-06 NOTE — H&P (Signed)
Paper H&P to be scanned into permanent record. H&P reviewed and patient re-examined. No changes. 

## 2017-11-06 NOTE — Anesthesia Preprocedure Evaluation (Addendum)
Anesthesia Evaluation  Patient identified by MRN, date of birth, ID band Patient awake    Reviewed: Allergy & Precautions, NPO status , Patient's Chart, lab work & pertinent test results  Airway Mallampati: III       Dental  (+) Upper Dentures   Pulmonary former smoker,    Pulmonary exam normal        Cardiovascular hypertension, Pt. on medications Normal cardiovascular exam     Neuro/Psych PSYCHIATRIC DISORDERS Anxiety Depression negative neurological ROS     GI/Hepatic Neg liver ROS,   Endo/Other  Morbid obesity  Renal/GU negative Renal ROS  negative genitourinary   Musculoskeletal negative musculoskeletal ROS (+)   Abdominal Normal abdominal exam  (+)   Peds negative pediatric ROS (+)  Hematology   Anesthesia Other Findings Past Medical History: No date: Cataract No date: HTN (hypertension) No date: Vitamin D deficiency  Cardiology cleared EKG as non-specific ST changes with no cardiac issues  Reproductive/Obstetrics                           Anesthesia Physical Anesthesia Plan  ASA: II  Anesthesia Plan: General   Post-op Pain Management:    Induction: Intravenous, Rapid sequence and Cricoid pressure planned  PONV Risk Score and Plan:   Airway Management Planned: Oral ETT  Additional Equipment:   Intra-op Plan:   Post-operative Plan: Extubation in OR  Informed Consent: I have reviewed the patients History and Physical, chart, labs and discussed the procedure including the risks, benefits and alternatives for the proposed anesthesia with the patient or authorized representative who has indicated his/her understanding and acceptance.   Dental advisory given  Plan Discussed with: CRNA and Surgeon  Anesthesia Plan Comments:        Anesthesia Quick Evaluation

## 2017-11-06 NOTE — OR Nursing (Signed)
Dr Kayleen Memos reviewed previous EKG and note from cardiologists from 4/19.  Also reviewed labs form July.  No new orders.

## 2017-11-06 NOTE — Op Note (Signed)
11/06/2017  1:20 PM  Patient:   Jennifer Huff  Pre-Op Diagnosis:   Lateral meniscus root tear, right knee.  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  Procedure:   Arthroscopic abrasion chondroplasty, debridement of medial shelf plica, and arthroscopically-assisted repair of lateral meniscus root tear, right knee.  Surgeon:   Pascal Lux, M.D.  Assistant:   Morley Kos, PA-C; Phoebe Sharps, PA-S  Anesthesia:   GET  Findings:   As above. There were diffuse grade 3 chondral malacia changes involving the central portion of the lateral tibial plateau, and grade 1 chondromalacial changes involving the central portion of the medial tibial plateau. The remaining articular surfaces all were in satisfactory condition. The anterior posterior cruciate ligaments both were in excellent condition, as was the medial meniscus.  Complications:   None.  EBL:   5 cc.  Total fluids:   950 cc of crystalloid.  Tourniquet time:   None  Drains:   None  Closure:   Staples.  Brief clinical note:   The patient is a 67 year old female with a several month history of lateral sided right knee pain. These symptoms have persisted despite medications, activity modification, etc. The patient's history and examination were consistent with a lateral meniscus tear. An MRI scan demonstrated the presence of a lateral meniscus root tear. The patient presents at this time for arthroscopy, debridement, and repair versus partial lateral meniscectomy.  Procedure:   The patient was brought into the operating room and lain in the supine position. After adequate general endotracheal intubation and anesthesia was obtained, a timeout was performed to verify the appropriate side. The patient's right knee was injected sterilely using a solution of 30 cc of 1% lidocaine and 30 cc of 0.5% Sensorcaine with epinephrine. The  right lower extremity was prepped with ChloraPrep solution before being draped sterilely. Preoperative antibiotics were administered. The expected portal sites were injected with 0.5% Sensorcaine with epinephrine before the camera was placed in the anterolateral portal and instrumentation performed through the anteromedial portal. The knee was sequentially examined beginning in the suprapatellar pouch, then progressing to the patellofemoral space, the medial gutter compartment, the notch, and finally the lateral compartment and gutter. The findings were as described above. Abundant reactive synovial tissues anteriorly were debrided using the full-radius resector in order to improve visualization. During this debridement, a large symptom medic plica was identified and debrided. This plica appeared to be abrading the medial edge of the medial femoral condyle. In addition, the areas of grade 3 chondromalacial changes on the lateral tibial plateau were debrided back to stable margins using the full-radius resector before being lightly annealed utilizing the ArthroCare wand.  The lateral meniscus was carefully probed and demonstrated an unstable tear of the meniscal root. This was repaired using the [employer] system. The meniscal root tear was debrided using the full-radius resector before the tibial root attachment site was debrided using a combination of loop curettes and full-radius resector to expose bare bone. Utilizing the drill guide set at 45 degrees and through a 2 cm incision made over the anteromedial aspect of the proximal tibia, two parallel drill holes were made up through the proximal tibia to enter the prepared footprint of the posterior horn of the lateral meniscus. Utilizing the mini-fast pass device, two 1.5 mm fiber tapes were passed through the meniscal root tissue and retrieved through these drill holes utilizing the looped wire  suture. The sutures were then tied over a bone bridge  anteromedially with firm tension applied. The repair was assessed and found to be stable to probing. It also appeared to be stable with range of motion of the knee. The instruments were removed from the joint after suctioning the excess fluid.   The subcutaneous tissues in the anterior wound were reapproximated in two layers using 2-0 Vicryl interrupted sutures before the skin was closed using 4-0 Prolene interrupted sutures. The portal sites also were closed using 4-0 Prolene interrupted sutures. A sterile bulky dressing was applied to the knee before the patient was placed into a hinged knee brace with the hinges set at 0-90, but locked in extension. The patient was then awakened, extubated, and returned to the recovery room in satisfactory condition after tolerating the procedure well.

## 2017-11-06 NOTE — Discharge Instructions (Addendum)
Orthopedic discharge instructions: Keep dressing dry and intact.  May shower after dressing changed on post-op day #4 (Saturday).  Cover staples with Band-Aids after drying off. Apply ice frequently to knee. Take Aleve 2 tabs BID with meals for 7-10 days, then as necessary. Take oxycodone as prescribed when needed.  May supplement with ES Tylenol if necessary. No weight-bearing on right leg. Keep brace on and locked - use crutches or walker. Follow-up in 10-14 days or as scheduled.    AMBULATORY SURGERY  DISCHARGE INSTRUCTIONS   1) The drugs that you were given will stay in your system until tomorrow so for the next 24 hours you should not:  A) Drive an automobile B) Make any legal decisions C) Drink any alcoholic beverage   2) You may resume regular meals tomorrow.  Today it is better to start with liquids and gradually work up to solid foods.  You may eat anything you prefer, but it is better to start with liquids, then soup and crackers, and gradually work up to solid foods.   3) Please notify your doctor immediately if you have any unusual bleeding, trouble breathing, redness and pain at the surgery site, drainage, fever, or pain not relieved by medication.    4) Additional Instructions:        Please contact your physician with any problems or Same Day Surgery at [phone], Monday through Friday 6 am to 4 pm, or [place] at [facility] number at [phone].

## 2017-11-06 NOTE — Anesthesia Procedure Notes (Signed)
Procedure Name: Intubation Date/Time: 11/06/2017 10:58 AM Performed by: Bernardo Heater, CRNA Pre-anesthesia Checklist: Patient identified, Emergency Drugs available, Suction available and Patient being monitored Patient Re-evaluated:Patient Re-evaluated prior to induction Oxygen Delivery Method: Circle system utilized Preoxygenation: Pre-oxygenation with 100% oxygen Induction Type: IV induction Laryngoscope Size: Mac and 3 Grade View: Grade I Tube size: 7.0 mm Placement Confirmation: ETT inserted through vocal cords under direct vision,  positive ETCO2 and breath sounds checked- equal and bilateral Secured at: 21 cm Tube secured with: Tape Dental Injury: Teeth and Oropharynx as per pre-operative assessment

## 2017-11-07 ENCOUNTER — Encounter: Payer: Self-pay | Admitting: Surgery

## 2017-11-09 DIAGNOSIS — M1711 Unilateral primary osteoarthritis, right knee: Secondary | ICD-10-CM | POA: Insufficient documentation

## 2017-11-09 DIAGNOSIS — S83271A Complex tear of lateral meniscus, current injury, right knee, initial encounter: Secondary | ICD-10-CM | POA: Insufficient documentation

## 2017-11-13 NOTE — Anesthesia Postprocedure Evaluation (Signed)
Anesthesia Post Note  Patient: Rosella Crandell  Procedure(s) Performed: KNEE ARTHROSCOPY WITH REPAIR LATERAL MENISCAL ROOT TEAR (Right Knee)  Patient location during evaluation: PACU Anesthesia Type: General Level of consciousness: awake and alert and oriented Pain management: pain level controlled Vital Signs Assessment: post-procedure vital signs reviewed and stable Respiratory status: spontaneous breathing Cardiovascular status: blood pressure returned to baseline Anesthetic complications: no     Last Vitals:  Vitals:   11/06/17 1453 11/06/17 1512  BP:  (!) 109/57  Pulse:  62  Resp:  16  Temp:    SpO2: 100% 100%    Last Pain:  Vitals:   11/07/17 0817  TempSrc:   PainSc: 0-No pain                 Rosser [place]

## 2017-11-20 DIAGNOSIS — Z9889 Other specified postprocedural states: Secondary | ICD-10-CM | POA: Insufficient documentation

## 2018-01-25 ENCOUNTER — Ambulatory Visit (INDEPENDENT_AMBULATORY_CARE_PROVIDER_SITE_OTHER): Payer: Medicare Other | Admitting: Family

## 2018-01-25 ENCOUNTER — Other Ambulatory Visit: Payer: Self-pay

## 2018-01-25 ENCOUNTER — Encounter: Payer: Self-pay | Admitting: Family

## 2018-01-25 VITALS — BP 121/80 | HR 83 | Temp 98.2°F | Wt 245.4 lb

## 2018-01-25 DIAGNOSIS — E785 Hyperlipidemia, unspecified: Secondary | ICD-10-CM

## 2018-01-25 DIAGNOSIS — Z780 Asymptomatic menopausal state: Secondary | ICD-10-CM | POA: Diagnosis not present

## 2018-01-25 DIAGNOSIS — R7303 Prediabetes: Secondary | ICD-10-CM | POA: Diagnosis not present

## 2018-01-25 DIAGNOSIS — F419 Anxiety disorder, unspecified: Secondary | ICD-10-CM

## 2018-01-25 DIAGNOSIS — I10 Essential (primary) hypertension: Secondary | ICD-10-CM

## 2018-01-25 DIAGNOSIS — E782 Mixed hyperlipidemia: Secondary | ICD-10-CM | POA: Diagnosis not present

## 2018-01-25 DIAGNOSIS — F329 Major depressive disorder, single episode, unspecified: Secondary | ICD-10-CM

## 2018-01-25 LAB — LIPID PANEL
Cholesterol: 142 mg/dL (ref 0–200)
HDL: 54.9 mg/dL (ref >39.00)
LDL Cholesterol: 69 mg/dL (ref 0–99)
NonHDL: 87.44
Total CHOL/HDL Ratio: 3
Triglycerides: 90 mg/dL (ref 0.0–149.0)
VLDL: 18 mg/dL (ref 0.0–40.0)

## 2018-01-25 LAB — COMPREHENSIVE METABOLIC PANEL
ALBUMIN: 4.2 g/dL (ref 3.5–5.2)
ALT: 9 U/L (ref 0–35)
AST: 13 U/L (ref 0–37)
Alkaline Phosphatase: 80 U/L (ref 39–117)
BUN: 19 mg/dL (ref 6–23)
CO2: 26 mEq/L (ref 19–32)
Calcium: 10.2 mg/dL (ref 8.4–10.5)
Chloride: 108 mEq/L (ref 96–112)
Creatinine, Ser: 1.22 mg/dL — ABNORMAL HIGH (ref 0.40–1.20)
GFR: 53.3 mL/min — ABNORMAL LOW (ref >60.00)
Glucose, Bld: 122 mg/dL — ABNORMAL HIGH (ref 70–99)
Potassium: 4.5 mEq/L (ref 3.5–5.1)
Sodium: 141 mEq/L (ref 135–145)
Total Bilirubin: 0.7 mg/dL (ref 0.2–1.2)
Total Protein: 6.7 g/dL (ref 6.0–8.3)

## 2018-01-25 LAB — HEMOGLOBIN A1C: HEMOGLOBIN A1C: 5.9 % (ref 4.6–6.5)

## 2018-01-25 MED ORDER — PRAVASTATIN SODIUM 40 MG PO TABS: 40.0000 mg | ORAL_TABLET | Freq: Every day | ORAL | 1 refills | Status: DC

## 2018-01-25 MED ORDER — LOSARTAN POTASSIUM 100 MG PO TABS: 100.0000 mg | ORAL_TABLET | Freq: Every day | ORAL | 4 refills | Status: DC

## 2018-01-25 MED ORDER — AMLODIPINE BESYLATE 10 MG PO TABS: 10.0000 mg | ORAL_TABLET | Freq: Every day | ORAL | 1 refills | Status: DC

## 2018-01-25 NOTE — Patient Instructions (Addendum)
Please call call and schedule your bone density scan as discussed.   [place]  [place], [place]   Nice to see you!

## 2018-01-25 NOTE — Assessment & Plan Note (Signed)
Pending lipid panel 

## 2018-01-25 NOTE — Progress Notes (Signed)
Subjective:    Patient ID: Jennifer Huff, female    DOB: March 17, 1950, 68 y.o.   MRN: [id number]  CC: Jennifer Huff is a 68 y.o. female who presents today for follow up.   HPI: HTN- compliant with medications. No cp, sob.   No longer on zoloft. Doesn't feel like she needs. Sleeping well. No si/hi  HLD- on pravachal  Right knee surgery 11/2017 to repair menisucs with dr Jennifer Huff. Continuing to do PT.    HISTORY:  Past Medical History:  Diagnosis Date  . Cataract   . HTN (hypertension)   . Vitamin D deficiency    Past Surgical History:  Procedure Laterality Date  . KNEE ARTHROSCOPY WITH LATERAL MENISECTOMY Right 11/06/2017   Procedure: KNEE ARTHROSCOPY WITH REPAIR LATERAL MENISCAL ROOT TEAR;  Surgeon: Jennifer Mull, MD;  Location: [facility] ORS;  Service: Orthopedics;  Laterality: Right;  . LAPAROSCOPIC HYSTERECTOMY     Family History  Problem Relation Age of Onset  . Kidney disease Father   . Heart disease Father        died from mi  . Cancer Father        prostate  . Hypertension Sister   . Colon polyps Sister   . Hypertension Brother   . Hypertension Sister   . Cancer Paternal Grandmother        head and neck  . Hypertension Brother   . Colon cancer Neg Hx   . Breast cancer Neg Hx     Allergies: Ace inhibitors and Codeine Current Outpatient Medications on File Prior to Visit  Medication Sig Dispense Refill  . acetaminophen (TYLENOL) 500 MG tablet Take 1,000 mg by mouth daily as needed for mild pain.    . calcium carbonate (TUMS - DOSED IN MG ELEMENTAL CALCIUM) 500 MG chewable tablet Chew 2 tablets by mouth daily as needed for indigestion or heartburn.    . Cholecalciferol (VITAMIN D3) 2000 units TABS Take 2,000 Units by mouth at bedtime.     Marland Kitchen KETOROLAC TROMETHAMINE OP Place 1 drop into the left eye 2 (two) times daily as needed (eye pain).     No current facility-administered medications on file prior to visit.     Social History   Tobacco Use  .  Smoking status: Former Smoker    Last attempt to quit: 12/07/1997    Years since quitting: 20.1  . Smokeless tobacco: Never Used  Substance Use Topics  . Alcohol use: Not Currently  . Drug use: No    Review of Systems  Constitutional: Negative for chills and fever.  Respiratory: Negative for cough.   Cardiovascular: Negative for chest pain and palpitations.  Gastrointestinal: Negative for nausea and vomiting.  Psychiatric/Behavioral: Negative for sleep disturbance and suicidal ideas. The patient is not nervous/anxious.       Objective:    BP 121/80   Pulse 83   Temp 98.2 F (36.8 C)   Wt 245 lb 6.4 oz (111.3 kg)   SpO2 95%   BMI 39.61 kg/m  BP Readings from Last 3 Encounters:  01/25/18 121/80  11/06/17 (!) 109/57  07/25/17 132/70   Wt Readings from Last 3 Encounters:  01/25/18 245 lb 6.4 oz (111.3 kg)  11/06/17 250 lb (113.4 kg)  07/25/17 251 lb (113.9 kg)    Physical Exam Vitals signs reviewed.  Constitutional:      Appearance: She is well-developed.  Eyes:     Conjunctiva/sclera: Conjunctivae normal.  Cardiovascular:     Rate  and Rhythm: Normal rate and regular rhythm.     Pulses: Normal pulses.     Heart sounds: Normal heart sounds.  Pulmonary:     Effort: Pulmonary effort is normal.     Breath sounds: Normal breath sounds. No wheezing, rhonchi or rales.  Skin:    General: Skin is warm and dry.  Neurological:     Mental Status: She is alert.  Psychiatric:        Speech: Speech normal.        Behavior: Behavior normal.        Thought Content: Thought content normal.        Assessment & Plan:   Problem List Items Addressed This Visit      Cardiovascular and Mediastinum   Hypertension - Primary    At goal, continue regimen      Relevant Orders   Comprehensive metabolic panel     Other   Postmenopausal estrogen deficiency    Patient will schedule bone density.      Relevant Orders   DG Bone Density   Anxiety and depression    No longer  on zoloft. Feels well. Will continue to monitor      Borderline diabetes   Relevant Orders   Hemoglobin A1c   Mixed hyperlipidemia    Pending lipid panel      Relevant Orders   Lipid panel       I have discontinued Jennifer Huff. Mask's sertraline and oxyCODONE. [name] her Vitamin D3, KETOROLAC TROMETHAMINE OP, acetaminophen, and calcium carbonate.   No orders of the defined types were placed in this encounter.   Return precautions given.   Risks, benefits, and alternatives of the medications and treatment plan prescribed today were discussed, and patient expressed understanding.   Education regarding symptom management and diagnosis given to patient on AVS.  Continue to follow with Jennifer Hawthorne, FNP for routine health maintenance.   Jennifer Huff and I agreed with plan.   Jennifer Paris, FNP

## 2018-01-25 NOTE — Assessment & Plan Note (Signed)
Patient will schedule bone density.

## 2018-01-25 NOTE — Assessment & Plan Note (Signed)
No longer on zoloft. Feels well. Will continue to monitor

## 2018-01-25 NOTE — Assessment & Plan Note (Signed)
At goal, continue regimen. 

## 2018-02-04 ENCOUNTER — Encounter: Payer: Self-pay | Admitting: Family

## 2018-03-14 ENCOUNTER — Other Ambulatory Visit: Payer: Self-pay

## 2018-03-14 ENCOUNTER — Ambulatory Visit
Admission: RE | Admit: 2018-03-14 | Discharge: 2018-03-14 | Disposition: A | Discharge status: Home / Self Care | Payer: Medicare Other | Source: Ambulatory Visit | Attending: Family | Admitting: Family

## 2018-03-14 DIAGNOSIS — Z780 Asymptomatic menopausal state: Secondary | ICD-10-CM | POA: Diagnosis not present

## 2018-03-29 ENCOUNTER — Other Ambulatory Visit: Payer: Self-pay

## 2018-03-29 DIAGNOSIS — E785 Hyperlipidemia, unspecified: Secondary | ICD-10-CM

## 2018-03-29 MED ORDER — PRAVASTATIN SODIUM 40 MG PO TABS: 40.0000 mg | ORAL_TABLET | Freq: Every day | ORAL | 1 refills | Status: DC

## 2018-04-01 ENCOUNTER — Other Ambulatory Visit: Payer: Self-pay | Admitting: Family

## 2018-04-01 DIAGNOSIS — F419 Anxiety disorder, unspecified: Principal | ICD-10-CM

## 2018-04-01 DIAGNOSIS — F329 Major depressive disorder, single episode, unspecified: Secondary | ICD-10-CM

## 2018-07-29 ENCOUNTER — Ambulatory Visit (INDEPENDENT_AMBULATORY_CARE_PROVIDER_SITE_OTHER): Payer: Medicare Other | Admitting: Family

## 2018-07-29 ENCOUNTER — Encounter: Payer: Self-pay | Admitting: Family

## 2018-07-29 ENCOUNTER — Other Ambulatory Visit: Payer: Self-pay

## 2018-07-29 DIAGNOSIS — E782 Mixed hyperlipidemia: Secondary | ICD-10-CM | POA: Diagnosis not present

## 2018-07-29 DIAGNOSIS — Z1239 Encounter for other screening for malignant neoplasm of breast: Secondary | ICD-10-CM

## 2018-07-29 DIAGNOSIS — E785 Hyperlipidemia, unspecified: Secondary | ICD-10-CM | POA: Diagnosis not present

## 2018-07-29 DIAGNOSIS — I10 Essential (primary) hypertension: Secondary | ICD-10-CM | POA: Diagnosis not present

## 2018-07-29 MED ORDER — AMLODIPINE BESYLATE 10 MG PO TABS: 10.0000 mg | ORAL_TABLET | Freq: Every day | ORAL | 4 refills | Status: DC

## 2018-07-29 MED ORDER — PRAVASTATIN SODIUM 40 MG PO TABS: 40.0000 mg | ORAL_TABLET | Freq: Every day | ORAL | 4 refills | Status: DC

## 2018-07-29 NOTE — Progress Notes (Signed)
Verbal consent for services obtained from patient prior to services given.  Location of call:  provider at work patient at home  Names of all persons present for services: Mable Paris, NP Chief complaint:   Feels well. NO complaints. Needs refills.   HTN- compliant with medications.  Denies leg swelling, exertional chest pain or pressure, numbness or tingling radiating to left arm or jaw, palpitations, dizziness, frequent headaches, changes in vision, or shortness of breath.   HLD- compliant with medication.   Feels well without zoloft.   History, background, results pertinent:  Due for mammogram.   A/P/next steps: Problem List Items Addressed This Visit      Cardiovascular and Mediastinum   Hypertension    BP Readings from Last 3 Encounters:  01/25/18 121/80  11/06/17 (!) 109/57  07/25/17 132/70   Suspect controlled.  Patient will spot check at home.  Continue regimen for now      Relevant Medications   pravastatin (PRAVACHOL) 40 MG tablet   amLODipine (NORVASC) 10 MG tablet     Other   Screening for breast cancer - Primary    Ordered. Patient scheduled.       Relevant Orders   MM 3D SCREEN BREAST BILATERAL   Mixed hyperlipidemia    Continue medication. Declines labs ( last done 01/2018) today and prefers to wait until year end which I think is reasonable. Appointment scheduled.       Relevant Medications   pravastatin (PRAVACHOL) 40 MG tablet   amLODipine (NORVASC) 10 MG tablet    Other Visit Diagnoses    Hyperlipidemia, unspecified hyperlipidemia type       Relevant Medications   pravastatin (PRAVACHOL) 40 MG tablet   amLODipine (NORVASC) 10 MG tablet       I spent 15 min  discussing plan of care over the phone.

## 2018-07-29 NOTE — Assessment & Plan Note (Signed)
Ordered. Patient scheduled.

## 2018-07-29 NOTE — Assessment & Plan Note (Signed)
Continue medication. Declines labs ( last done 01/2018) today and prefers to wait until year end which I think is reasonable. Appointment scheduled.

## 2018-07-29 NOTE — Patient Instructions (Addendum)
Monitor blood pressure,  Goal is less than 120/80, based on newest guidelines; if persistently higher, please make sooner follow up appointment so we can recheck you blood pressure and manage medications  Please call call and schedule your 3D mammogramas discussed.   [place]  [place], [place]   We will do labs/follow up  at the end of year.   Stay safe!

## 2018-07-29 NOTE — Assessment & Plan Note (Addendum)
BP Readings from Last 3 Encounters:  01/25/18 121/80  11/06/17 (!) 109/57  07/25/17 132/70   Suspect controlled.  Patient will spot check at home.  Continue regimen for now

## 2018-07-30 NOTE — Progress Notes (Signed)
Printed and mailed

## 2018-09-03 ENCOUNTER — Ambulatory Visit
Admission: RE | Admit: 2018-09-03 | Discharge: 2018-09-03 | Disposition: A | Discharge status: Home / Self Care | Payer: Medicare Other | Source: Ambulatory Visit | Attending: Family | Admitting: Family

## 2018-09-03 DIAGNOSIS — Z1231 Encounter for screening mammogram for malignant neoplasm of breast: Secondary | ICD-10-CM | POA: Insufficient documentation

## 2018-09-03 DIAGNOSIS — Z1239 Encounter for other screening for malignant neoplasm of breast: Secondary | ICD-10-CM

## 2018-09-28 ENCOUNTER — Other Ambulatory Visit: Payer: Self-pay

## 2018-09-28 ENCOUNTER — Ambulatory Visit (INDEPENDENT_AMBULATORY_CARE_PROVIDER_SITE_OTHER): Payer: Medicare Other

## 2018-09-28 DIAGNOSIS — Z23 Encounter for immunization: Secondary | ICD-10-CM

## 2018-10-11 ENCOUNTER — Other Ambulatory Visit: Payer: Self-pay

## 2018-10-11 ENCOUNTER — Ambulatory Visit (INDEPENDENT_AMBULATORY_CARE_PROVIDER_SITE_OTHER): Payer: Medicare Other

## 2018-10-11 DIAGNOSIS — Z0000 Encounter for general adult medical examination without abnormal findings: Secondary | ICD-10-CM | POA: Diagnosis not present

## 2018-10-11 NOTE — Patient Instructions (Addendum)
  Jennifer Huff , Thank you for taking time to come for your Medicare Wellness Visit. I appreciate your ongoing commitment to your health goals. Please review the following plan we discussed and let me know if I can assist you in the future.   These are the goals we discussed: Goals      Patient Stated   . Increase physical activity (pt-stated)     I will start using the treadmill for exercise.        This is a list of the screening recommended for you and due dates:  Health Maintenance  Topic Date Due  . Pneumonia vaccines (2 of 2 - PPSV23) 04/19/2018  . Mammogram  09/02/2020  . Tetanus Vaccine  06/08/2021  . Colon Cancer Screening  02/07/2024  . Flu Shot  Completed  . DEXA scan (bone density measurement)  Completed  .  Hepatitis C: One time screening is recommended by Center for Disease Control  (CDC) for  adults born from [birth year] through 1964.   Completed

## 2018-10-11 NOTE — Progress Notes (Signed)
Subjective:   Jennifer Huff is a 68 y.o. female who presents for an Initial Medicare Annual Wellness Visit.  Review of Systems    No ROS.  Medicare Wellness Virtual Visit.  Visual/audio telehealth visit, UTA vital signs.   See social history for additional risk factors.    Cardiac Risk Factors include: advanced age (>16men, >5 women);hypertension     Objective:    Today's Vitals   There is no height or weight on file to calculate BMI.  Advanced Directives 10/11/2018 11/06/2017  Does Patient Have a Medical Advance Directive? No No  Would patient like information on creating a medical advance directive? No - Patient declined No - Patient declined    Current Medications (verified) Outpatient Encounter Medications as of 10/11/2018  Medication Sig  . acetaminophen (TYLENOL) 500 MG tablet Take 1,000 mg by mouth daily as needed for mild pain.  Marland Kitchen amLODipine (NORVASC) 10 MG tablet Take 1 tablet (10 mg total) by mouth daily.  . calcium carbonate (TUMS - DOSED IN MG ELEMENTAL CALCIUM) 500 MG chewable tablet Chew 2 tablets by mouth daily as needed for indigestion or heartburn.  . Cholecalciferol (VITAMIN D3) 2000 units TABS Take 2,000 Units by mouth at bedtime.   Marland Kitchen KETOROLAC TROMETHAMINE OP Place 1 drop into the left eye 2 (two) times daily as needed (eye pain).  Marland Kitchen losartan (COZAAR) 100 MG tablet Take 1 tablet (100 mg total) by mouth daily.  . pravastatin (PRAVACHOL) 40 MG tablet Take 1 tablet (40 mg total) by mouth daily.   No facility-administered encounter medications on file as of 10/11/2018.     Allergies (verified) Ace inhibitors and Codeine   History: Past Medical History:  Diagnosis Date  . Cataract   . HTN (hypertension)   . Vitamin D deficiency    Past Surgical History:  Procedure Laterality Date  . KNEE ARTHROSCOPY WITH LATERAL MENISECTOMY Right 11/06/2017   Procedure: KNEE ARTHROSCOPY WITH REPAIR LATERAL MENISCAL ROOT TEAR;  Surgeon: Corky Mull, MD;   Location: [facility] ORS;  Service: Orthopedics;  Laterality: Right;  . LAPAROSCOPIC HYSTERECTOMY     Family History  Problem Relation Age of Onset  . Kidney disease Father   . Heart disease Father        died from mi  . Cancer Father        prostate  . Hypertension Sister   . Colon polyps Sister   . Hypertension Brother   . Hypertension Sister   . Cancer Paternal Grandmother        head and neck  . Hypertension Brother   . Cancer Son   . Colon cancer Neg Hx   . Breast cancer Neg Hx    Social History   Socioeconomic History  . Marital status: Married    Spouse name: Not on file  . Number of children: 3  . Years of education: Not on file  . Highest education level: Not on file  Occupational History  . 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 Financial resource strain: Not hard at all  . Food insecurity    Worry: Never true    Inability: Never true  . Transportation needs    Medical: No    Non-medical: No  Tobacco Use  . Smoking status: Former Smoker    Quit date: 12/07/1997    Years since quitting: 20.8  . Smokeless tobacco: Never Used  Substance and Sexual Activity  . Alcohol use: Not Currently  .  Drug use: No  . Sexual activity: Not on file  Lifestyle  . Physical activity    Days per week: 0 days    Minutes per session: Not on file  . Stress: Not at all  Relationships  . Social Herbalist on phone: Not on file    Gets together: Not on file    Attends religious service: Not on file    Active member of club or organization: Not on file    Attends meetings of clubs or organizations: Not on file    Relationship status: Not on file  Other Topics Concern  . Not on file  Social History Narrative   Regular Exercise -  NO   Daily Caffeine Use:  2-3 soda/sw tea      Retired    Tobacco Counseling Counseling given: Not Answered   Clinical Intake:  Pre-visit preparation completed: Yes        Diabetes: No  How often do you  need to have someone help you when you read instructions, pamphlets, or other written materials from your doctor or pharmacy?: 1 - Never  Interpreter Needed?: No      Activities of Daily Living In your present state of health, do you have any difficulty performing the following activities: 10/11/2018 07/29/2018  Hearing? N N  Vision? N N  Difficulty concentrating or making decisions? N N  Walking or climbing stairs? N N  Dressing or bathing? N N  Doing errands, shopping? N N  Preparing Food and eating ? N -  Using the Toilet? N -  In the past six months, have you accidently leaked urine? N -  Do you have problems with loss of bowel control? N -  Managing your Medications? N -  Managing your Finances? N -  Housekeeping or managing your Housekeeping? N -  Some recent data might be hidden     Immunizations and Health Maintenance Immunization History  Administered Date(s) Administered  . Fluad Quad(high Dose 65+) 09/28/2018  . Influenza Split 09/08/2010  . Influenza,inj,Quad PF,6+ Mos 10/19/2016  . Influenza-Unspecified 10/10/2011, 10/18/2012, 10/17/2013, 10/23/2014, 10/22/2015, 11/05/2017  . Pneumococcal Conjugate-13 04/18/2017  . Tdap 06/09/2011  . Zoster 09/17/2011  . Zoster Recombinat (Shingrix) 06/11/2017, 09/06/2017   Health Maintenance Due  Topic Date Due  . PNA vac Low Risk Adult (2 of 2 - PPSV23) 04/19/2018    Patient Care Team: Burnard Hawthorne, FNP as PCP - General (Family Medicine)  Indicate any recent Medical Services you may have received from other than [facility] providers in the past year (date may be approximate).     Assessment:   This is a routine wellness examination for [place].  I connected with patient 10/11/18 at 11:30 AM EDT by an audio enabled telemedicine application and verified that I am speaking with the correct person using two identifiers. Patient stated full name and DOB. Patient gave permission to continue with virtual visit. Patient's  location was at home and Nurse's location was at [place] office.   Health Maintenance Due: -PNA - discussed; to be completed with doctor in visit or local pharmacy.  Update all pending maintenance due as appropriate.   See completed HM at the end of note.   Eye: Visual acuity not assessed. Virtual visit. Wears corrective lenses. Followed by their ophthalmologist every 12 months.   Dental: Dentures- yes  Hearing: Demonstrates normal hearing during visit.  Safety:  Patient feels safe at home- yes Patient does have smoke detectors at  home- yes Patient does wear sunscreen or protective clothing when in direct sunlight - yes Patient does wear seat belt when in a moving vehicle - yes Patient drives- yes Adequate lighting in walkways free from debris- yes Grab bars and handrails used as appropriate- yes Ambulates with no assistive device Cell phone on person when ambulating outside of the home- yes  Social: Alcohol intake - no      Smoking history- former Smokers in home? none Illicit drug use? none  Depression: PHQ 2 &9 complete. See screening below. Denies irritability, anhedonia, sadness/tearfullness.  Stable.   Falls: See screening below.    Medication: Taking as directed and without issues.   Covid-19: Precautions and sickness symptoms discussed. Wears mask, social distancing, hand hygiene as appropriate.   Activities of Daily Living Patient denies needing assistance with: household chores, feeding themselves, getting from bed to chair, getting to the toilet, bathing/showering, dressing, managing money, or preparing meals.   Memory: Patient is alert. Patient denies difficulty focusing or concentrating. Correctly identified the president of the [place], season and recall.  BMI- discussed the importance of a healthy diet, water intake and the benefits of aerobic exercise.  Educational material provided.  Physical activity- no routine. Encouraged to stay active.   Diet:  regular Water: good intake  Other Providers Patient Care Team: Burnard Hawthorne, FNP as PCP - General (Family Medicine) Hearing/Vision screen  Hearing Screening   125Hz  250Hz  500Hz  1000Hz  2000Hz  3000Hz  4000Hz  6000Hz  8000Hz   Right ear:           Left ear:           Comments: Patient is able to hear conversational tones without difficulty.  No issues reported.  Vision Screening Comments: Wears corrective lenses Visual acuity not assessed, virtual visit.  They have seen their ophthalmologist in the last 12 months.    Dietary issues and exercise activities discussed:    Goals      Patient Stated   . Increase physical activity (pt-stated)     I will start using the treadmill for exercise.       Depression Screen PHQ 2/9 Scores 10/11/2018 07/29/2018 07/13/2016 12/29/2015 06/29/2015 12/11/2011  PHQ - 2 Score 0 0 0 0 0 0  PHQ- 9 Score - 0 - - - -    Fall Risk Fall Risk  10/11/2018 07/13/2016 12/29/2015 06/29/2015  Falls in the past year? 0 No No No    Timed Get Up and Go Performed no, virtual visit  Cognitive Function:     6CIT Screen 10/11/2018  What Year? 0 points  What month? 0 points  What time? 0 points  Count back from 20 0 points  Months in reverse 0 points  Repeat phrase 0 points  Total Score 0    Screening Tests Health Maintenance  Topic Date Due  . PNA vac Low Risk Adult (2 of 2 - PPSV23) 04/19/2018  . MAMMOGRAM  09/02/2020  . TETANUS/TDAP  06/08/2021  . COLONOSCOPY  02/07/2024  . INFLUENZA VACCINE  Completed  . DEXA SCAN  Completed  . Hepatitis C Screening  Completed      Plan:    Keep all routine maintenance appointments.   Follow up 12/30/18 with your doctor.  Medicare Attestation I have personally reviewed: The patient's medical and social history Their use of alcohol, tobacco or illicit drugs Their current medications and supplements The patient's functional ability including ADLs,fall risks, home safety risks, cognitive, and hearing and  visual impairment Diet and  physical activities Evidence for depression   In addition, I have reviewed and discussed with patient certain preventive protocols, quality metrics, and best practice recommendations. A written personalized care plan for preventive services as well as general preventive health recommendations were provided to patient via mail.     Varney Biles, LPN   [id number]

## 2018-10-17 NOTE — Progress Notes (Signed)
I have reviewed the above note and agree.  Myles Mallicoat, M.D.  

## 2018-12-06 ENCOUNTER — Other Ambulatory Visit: Payer: Self-pay

## 2018-12-06 DIAGNOSIS — Z20822 Contact with and (suspected) exposure to covid-19: Secondary | ICD-10-CM

## 2018-12-07 LAB — NOVEL CORONAVIRUS, NAA: SARS-CoV-2, NAA: NOT DETECTED

## 2018-12-30 ENCOUNTER — Telehealth: Payer: Self-pay | Admitting: Family

## 2018-12-30 ENCOUNTER — Encounter: Payer: Self-pay | Admitting: Family

## 2018-12-30 ENCOUNTER — Other Ambulatory Visit: Payer: Self-pay

## 2018-12-30 ENCOUNTER — Ambulatory Visit (INDEPENDENT_AMBULATORY_CARE_PROVIDER_SITE_OTHER): Payer: Medicare Other | Admitting: Family

## 2018-12-30 VITALS — BP 114/71 | Ht 65.0 in | Wt 245.0 lb

## 2018-12-30 DIAGNOSIS — U071 COVID-19: Secondary | ICD-10-CM | POA: Diagnosis not present

## 2018-12-30 DIAGNOSIS — R630 Anorexia: Secondary | ICD-10-CM | POA: Insufficient documentation

## 2018-12-30 DIAGNOSIS — T3695XA Adverse effect of unspecified systemic antibiotic, initial encounter: Secondary | ICD-10-CM | POA: Diagnosis not present

## 2018-12-30 DIAGNOSIS — B379 Candidiasis, unspecified: Secondary | ICD-10-CM

## 2018-12-30 MED ORDER — FLUCONAZOLE 150 MG PO TABS: 150.0000 mg | ORAL_TABLET | Freq: Once | ORAL | 1 refills | Status: AC

## 2018-12-30 MED ORDER — ONDANSETRON 4 MG PO TBDP: 4.0000 mg | ORAL_TABLET | Freq: Three times a day (TID) | ORAL | 0 refills | Status: DC | PRN

## 2018-12-30 NOTE — Assessment & Plan Note (Addendum)
9 days out from COVID diagnosis and suspect from disease. See note above

## 2018-12-30 NOTE — Progress Notes (Signed)
Virtual Visit via Video Note  I connected with@  on 12/30/18 at  9:00 AM EST by a video enabled telemedicine application and verified that I am speaking with the correct person using two identifiers.  Location patient: home Location provider: home office Persons participating in the virtual visit: patient, provider  I discussed the limitations of evaluation and management by telemedicine and the availability of in person appointments. The patient expressed understanding and agreed to proceed.  Interactive audio and video telecommunications were attempted between this provider and patient, however failed, due to patient having technical difficulties or patient did not have access to video capability.  We continued and completed visit with audio only.    HPI:  CC: loss of appetite is 'most bothersome', unchanged for past week and a half. No vomiting. Feels like 'will throw up.' Feels fatigue and weak over past 9 days.  Diarrhea has improved, approx 2 brown liquid episodes per day. No blood inside of stool or severe abdominal pain. Has noted 'hemorrhoids' and notes BRB on the toilet paper.   Fever has resolved.  Continues to have decreased appetite.  'Everything I smell makes me sick. ' Has a sense smell and taste; never lost sense of taste. Drinking water; notes lips are chapped. Urine is clear yellow.  Had 'some coughing' but no SOB, CP, wheezing. Has been quarantining Lives with husband who feels well.  Hasnt been in touch with health department.   Covid positive 12/21/18. Seen at [facility].   Treated omnicef , completed , for bacterial sinusitis,  which she was concerned gave her diarrhea.   H/o HTN    ROS: See pertinent positives and negatives per HPI.  Past Medical History:  Diagnosis Date  . Cataract   . HTN (hypertension)   . Vitamin D deficiency     Past Surgical History:  Procedure Laterality Date  . KNEE ARTHROSCOPY WITH LATERAL MENISECTOMY Right 11/06/2017   Procedure: KNEE ARTHROSCOPY WITH REPAIR LATERAL MENISCAL ROOT TEAR;  Surgeon: Corky Mull, MD;  Location: [facility] ORS;  Service: Orthopedics;  Laterality: Right;  . LAPAROSCOPIC HYSTERECTOMY      Family History  Problem Relation Age of Onset  . Kidney disease Father   . Heart disease Father        died from mi  . Cancer Father        prostate  . Hypertension Sister   . Colon polyps Sister   . Hypertension Brother   . Hypertension Sister   . Cancer Paternal Grandmother        head and neck  . Hypertension Brother   . Cancer Son   . Colon cancer Neg Hx   . Breast cancer Neg Hx     SOCIAL HX: former smoker   Current Outpatient Medications:  .  acetaminophen (TYLENOL) 500 MG tablet, Take 1,000 mg by mouth daily as needed for mild pain., Disp: , Rfl:  .  amLODipine (NORVASC) 10 MG tablet, Take 1 tablet (10 mg total) by mouth daily., Disp: 90 tablet, Rfl: 4 .  calcium carbonate (TUMS - DOSED IN MG ELEMENTAL CALCIUM) 500 MG chewable tablet, Chew 2 tablets by mouth daily as needed for indigestion or heartburn., Disp: , Rfl:  .  Cholecalciferol (VITAMIN D3) 2000 units TABS, Take 2,000 Units by mouth at bedtime. , Disp: , Rfl:  .  KETOROLAC TROMETHAMINE OP, Place 1 drop into the left eye 2 (two) times daily as needed (eye pain)., Disp: , Rfl:  .  losartan (COZAAR) 100 MG tablet, Take 1 tablet (100 mg total) by mouth daily., Disp: 90 tablet, Rfl: 4 .  pravastatin (PRAVACHOL) 40 MG tablet, Take 1 tablet (40 mg total) by mouth daily., Disp: 90 tablet, Rfl: 4 .  fluconazole (DIFLUCAN) 150 MG tablet, Take 1 tablet (150 mg total) by mouth once for 1 dose. Take one tablet PO once. If sxs persist, may take one tablet PO 3 days later., Disp: 2 tablet, Rfl: 1 .  ondansetron (ZOFRAN ODT) 4 MG disintegrating tablet, Take 1 tablet (4 mg total) by mouth every 8 (eight) hours as needed for nausea or vomiting., Disp: 30 tablet, Rfl: 0    ASSESSMENT AND PLAN:  Discussed the following assessment and  plan:  Decreased appetite - Plan: ondansetron (ZOFRAN ODT) 4 MG disintegrating tablet  COVID-19 virus detected  Antibiotic-induced yeast infection - Plan: fluconazole (DIFLUCAN) 150 MG tablet Problem List Items Addressed This Visit      Other   COVID-19 virus detected    No acute respiratory distress.  Patient remains in quarantine.  Her primary concern is decrease in appetite.  We discussed treating this with Zofran.  Also advised small more frequent meals to reengage /stimulate her appetite.  She will let me know if this does not help .  She declines Covid continue monitoring program Of note, diflucan was given for suspected antibiotic-induced yeast infection.  Advised probiotics as well      Decreased appetite - Primary    9 days out from COVID diagnosis and suspect from disease. See note above       Relevant Medications   ondansetron (ZOFRAN ODT) 4 MG disintegrating tablet    Other Visit Diagnoses    Antibiotic-induced yeast infection       Relevant Medications   fluconazole (DIFLUCAN) 150 MG tablet      -we discussed possible serious and likely etiologies, options for evaluation and workup, limitations of telemedicine visit vs in person visit, treatment, treatment risks and precautions. Pt prefers to treat via telemedicine empirically rather then risking or undertaking an in person visit at this moment. Patient agrees to seek prompt in person care if worsening, new symptoms arise, or if is not improving with treatment.   I discussed the assessment and treatment plan with the patient. The patient was provided an opportunity to ask questions and all were answered. The patient agreed with the plan and demonstrated an understanding of the instructions.   The patient was advised to call back or seek an in-person evaluation if the symptoms worsen or if the condition fails to improve as anticipated.   Mable Paris, FNP

## 2018-12-30 NOTE — Assessment & Plan Note (Addendum)
No acute respiratory distress.  Patient remains in quarantine.  Her primary concern is decrease in appetite.  We discussed treating this with Zofran.  Also advised small more frequent meals to reengage /stimulate her appetite.  She will let me know if this does not help .  She declines Covid continue monitoring program Of note, diflucan was given for suspected antibiotic-induced yeast infection.  Advised probiotics as well

## 2018-12-30 NOTE — Telephone Encounter (Signed)
close

## 2019-01-01 ENCOUNTER — Emergency Department: Payer: Medicare Other

## 2019-01-01 ENCOUNTER — Encounter: Payer: Self-pay | Admitting: Emergency Medicine

## 2019-01-01 ENCOUNTER — Other Ambulatory Visit: Payer: Self-pay

## 2019-01-01 ENCOUNTER — Emergency Department
Admission: EM | Admit: 2019-01-01 | Discharge: 2019-01-02 | Disposition: A | Discharge status: Home / Self Care | Payer: Medicare Other | Attending: Student in an Organized Health Care Education/Training Program | Admitting: Student in an Organized Health Care Education/Training Program

## 2019-01-01 DIAGNOSIS — M549 Dorsalgia, unspecified: Secondary | ICD-10-CM | POA: Diagnosis not present

## 2019-01-01 DIAGNOSIS — R102 Pelvic and perineal pain: Secondary | ICD-10-CM | POA: Diagnosis not present

## 2019-01-01 DIAGNOSIS — R110 Nausea: Secondary | ICD-10-CM | POA: Diagnosis not present

## 2019-01-01 DIAGNOSIS — R1032 Left lower quadrant pain: Secondary | ICD-10-CM | POA: Diagnosis present

## 2019-01-01 DIAGNOSIS — N838 Other noninflammatory disorders of ovary, fallopian tube and broad ligament: Secondary | ICD-10-CM | POA: Insufficient documentation

## 2019-01-01 DIAGNOSIS — Z79899 Other long term (current) drug therapy: Secondary | ICD-10-CM | POA: Insufficient documentation

## 2019-01-01 DIAGNOSIS — R109 Unspecified abdominal pain: Secondary | ICD-10-CM

## 2019-01-01 DIAGNOSIS — E278 Other specified disorders of adrenal gland: Secondary | ICD-10-CM | POA: Diagnosis not present

## 2019-01-01 DIAGNOSIS — I10 Essential (primary) hypertension: Secondary | ICD-10-CM | POA: Diagnosis not present

## 2019-01-01 DIAGNOSIS — Z87891 Personal history of nicotine dependence: Secondary | ICD-10-CM | POA: Diagnosis not present

## 2019-01-01 DIAGNOSIS — R630 Anorexia: Secondary | ICD-10-CM | POA: Insufficient documentation

## 2019-01-01 LAB — CBC
HCT: 37.1 % (ref 36.0–46.0)
Hemoglobin: 12.3 g/dL (ref 12.0–15.0)
MCH: 26.5 pg (ref 26.0–34.0)
MCHC: 33.2 g/dL (ref 30.0–36.0)
MCV: 79.8 fL — ABNORMAL LOW (ref 80.0–100.0)
Platelets: 242 10*3/uL (ref 150–400)
RBC: 4.65 MIL/uL (ref 3.87–5.11)
RDW: 13.9 % (ref 11.5–15.5)
WBC: 6.5 10*3/uL (ref 4.0–10.5)
nRBC: 0 % (ref 0.0–0.2)

## 2019-01-01 LAB — URINALYSIS, COMPLETE (UACMP) WITH MICROSCOPIC
Bacteria, UA: NONE SEEN
Bilirubin Urine: NEGATIVE
Glucose, UA: NEGATIVE mg/dL
Hgb urine dipstick: NEGATIVE
Ketones, ur: NEGATIVE mg/dL
Leukocytes,Ua: NEGATIVE
Nitrite: NEGATIVE
Protein, ur: 30 mg/dL — AB
Specific Gravity, Urine: 1.012 (ref 1.005–1.030)
pH: 5 (ref 5.0–8.0)

## 2019-01-01 LAB — COMPREHENSIVE METABOLIC PANEL
ALT: 33 U/L (ref 0–44)
AST: 41 U/L (ref 15–41)
Albumin: 3.5 g/dL (ref 3.5–5.0)
Alkaline Phosphatase: 57 U/L (ref 38–126)
Anion gap: 11 (ref 5–15)
BUN: 16 mg/dL (ref 8–23)
CO2: 24 mmol/L (ref 22–32)
Calcium: 10.1 mg/dL (ref 8.9–10.3)
Chloride: 108 mmol/L (ref 98–111)
Creatinine, Ser: 1.49 mg/dL — ABNORMAL HIGH (ref 0.44–1.00)
GFR calc Af Amer: 42 mL/min — ABNORMAL LOW (ref >60)
GFR calc non Af Amer: 36 mL/min — ABNORMAL LOW (ref >60)
Glucose, Bld: 143 mg/dL — ABNORMAL HIGH (ref 70–99)
Potassium: 4.1 mmol/L (ref 3.5–5.1)
Sodium: 143 mmol/L (ref 135–145)
Total Bilirubin: 1.2 mg/dL (ref 0.3–1.2)
Total Protein: 7.6 g/dL (ref 6.5–8.1)

## 2019-01-01 LAB — LIPASE, BLOOD: Lipase: 32 U/L (ref 11–51)

## 2019-01-01 MED ORDER — GADOBUTROL 1 MMOL/ML IV SOLN
10.0000 mL | Freq: Once | INTRAVENOUS | Status: AC | PRN
  Administered 2019-01-01: 10 mL via INTRAVENOUS
  Filled 2019-01-01: qty 10

## 2019-01-01 MED ORDER — SODIUM CHLORIDE 0.9% FLUSH: 3.0000 mL | Freq: Once | INTRAVENOUS | Status: DC

## 2019-01-01 MED ORDER — LORAZEPAM 2 MG/ML IJ SOLN
1.0000 mg | Freq: Once | INTRAMUSCULAR | Status: AC
  Administered 2019-01-01: 1 mg via INTRAVENOUS
  Filled 2019-01-01: qty 1

## 2019-01-01 MED ORDER — LORAZEPAM 1 MG PO TABS
1.0000 mg | ORAL_TABLET | Freq: Once | ORAL | Status: AC
  Administered 2019-01-01: 1 mg via ORAL
  Filled 2019-01-01: qty 1

## 2019-01-01 NOTE — ED Notes (Signed)
Patient currently in [place]. Once return to ED to be roomed in room 17

## 2019-01-01 NOTE — ED Notes (Signed)
Patient transported to MRI 

## 2019-01-01 NOTE — ED Provider Notes (Signed)
[facility] Emergency Department Provider Note  Time seen: 6:27 PM  I have reviewed the triage vital signs and the nursing notes.   HISTORY  Chief Complaint Abdominal Pain   HPI Jennifer Huff is a 68 y.o. female with a past medical history of hypertension, hyperlipidemia, recent Covid infection diagnosed 12/21/2018 presents to the emergency department for some left-sided abdominal/back pain.  According to the patient for the past 24 hours or so she has been experiencing pain in the left back radiating around to the left lower quadrant.  Denies any vomiting or diarrhea states some nausea with decreased appetite.  Denies any fever for the past 5 to 7 days, denies any shortness of breath or chest pain does state very minimal cough at times.  Describes the pain is intermittent moderate and sharp when it does occur.   Past Medical History:  Diagnosis Date  . Cataract   . HTN (hypertension)   . Vitamin D deficiency     Patient Active Problem List   Diagnosis Date Noted  . Decreased appetite 12/30/2018  . COVID-19 virus detected 12/30/2018  . Chest pain with moderate risk for cardiac etiology 04/26/2017  . Borderline diabetes 04/26/2017  . Mixed hyperlipidemia 04/26/2017  . Former smoker 04/25/2017  . Anxiety and depression 04/11/2017  . Dry skin 06/29/2015  . Postmenopausal estrogen deficiency 12/23/2014  . Hemorrhoid 12/23/2014  . Screening for breast cancer 06/23/2014  . Allergic rhinitis 06/23/2014  . Routine general medical examination at a health care facility 12/16/2012  . Hypertension 12/08/2010  . Morbid obesity ([place]) 12/08/2010    Past Surgical History:  Procedure Laterality Date  . KNEE ARTHROSCOPY WITH LATERAL MENISECTOMY Right 11/06/2017   Procedure: KNEE ARTHROSCOPY WITH REPAIR LATERAL MENISCAL ROOT TEAR;  Surgeon: Corky Mull, MD;  Location: [facility] ORS;  Service: Orthopedics;  Laterality: Right;  . LAPAROSCOPIC HYSTERECTOMY       Prior to Admission medications   Medication Sig Start Date End Date Taking? Authorizing Provider  acetaminophen (TYLENOL) 500 MG tablet Take 1,000 mg by mouth daily as needed for mild pain.    [provider]  amLODipine (NORVASC) 10 MG tablet Take 1 tablet (10 mg total) by mouth daily. 07/29/18   Burnard Hawthorne, FNP  calcium carbonate (TUMS - DOSED IN MG ELEMENTAL CALCIUM) 500 MG chewable tablet Chew 2 tablets by mouth daily as needed for indigestion or heartburn.    [provider]  Cholecalciferol (VITAMIN D3) 2000 units TABS Take 2,000 Units by mouth at bedtime.     [provider]  KETOROLAC TROMETHAMINE OP Place 1 drop into the left eye 2 (two) times daily as needed (eye pain).    [provider]  losartan (COZAAR) 100 MG tablet Take 1 tablet (100 mg total) by mouth daily. 01/25/18 01/25/19  Burnard Hawthorne, FNP  ondansetron (ZOFRAN ODT) 4 MG disintegrating tablet Take 1 tablet (4 mg total) by mouth every 8 (eight) hours as needed for nausea or vomiting. 12/30/18   Burnard Hawthorne, FNP  pravastatin (PRAVACHOL) 40 MG tablet Take 1 tablet (40 mg total) by mouth daily. 07/29/18   Burnard Hawthorne, FNP    Allergies  Allergen Reactions  . Ace Inhibitors Swelling    Lips swelled.  . Codeine Other (See Comments)    Jittery     Family History  Problem Relation Age of Onset  . Kidney disease Father   . Heart disease Father  died from mi  . Cancer Father        prostate  . Hypertension Sister   . Colon polyps Sister   . Hypertension Brother   . Hypertension Sister   . Cancer Paternal Grandmother        head and neck  . Hypertension Brother   . Cancer Son   . Colon cancer Neg Hx   . Breast cancer Neg Hx     Social History Social History   Tobacco Use  . Smoking status: Former Smoker    Quit date: 12/07/1997    Years since quitting: 21.0  . Smokeless tobacco: Never Used  Substance Use Topics  . Alcohol use: Not Currently   . Drug use: No    Review of Systems Constitutional: Negative for fever. Cardiovascular: Negative for chest pain. Respiratory: Negative for shortness of breath. Gastrointestinal: Left flank pain. Genitourinary: Negative for urinary compaints Musculoskeletal: Negative for musculoskeletal complaints Neurological: Negative for headache All other ROS negative  ____________________________________________   PHYSICAL EXAM:  VITAL SIGNS: ED Triage Vitals  Enc Vitals Group     BP 01/01/19 1454 119/77     Pulse Rate 01/01/19 1454 (!) 106     Resp 01/01/19 1454 16     Temp 01/01/19 1457 97.8 F (36.6 C)     Temp Source 01/01/19 1454 Oral     SpO2 01/01/19 1454 97 %     Weight 01/01/19 1454 244 lb 14.9 oz (111.1 kg)     Height 01/01/19 1454 5\' 5"  (1.651 m)     Head Circumference --      Peak Flow --      Pain Score 01/01/19 1453 9     Pain Loc --      Pain Edu? --      Excl. in [place]? --    Constitutional: Alert and oriented. Well appearing and in no distress. Eyes: Normal exam ENT      Head: Normocephalic and atraumatic      Mouth/Throat: Mucous membranes are moist. Cardiovascular: Normal rate, regular rhythm.  Respiratory: Normal respiratory effort without tachypnea nor retractions. Breath sounds are clear Gastrointestinal: Soft and nontender. No distention.   Musculoskeletal: Nontender with normal range of motion in all extremities.  Neurologic:  Normal speech and language. No gross focal neurologic deficits  Skin:  Skin is warm, dry and intact.  Psychiatric: Mood and affect are normal.   ____________________________________________    EKG  EKG viewed and interpreted by myself shows a normal sinus rhythm at 98 bpm with a narrow QRS, normal axis, normal intervals besides slight QTC prolongation, nonspecific ST changes.  Radiology:  IMPRESSION:  1. Abnormal expansion, heterogeneous density, and marginal stranding  with adjacent fluid along the left ovary.  Possibilities include  hemorrhage, mass, or a vascular abnormality such as torsion.  Consider dedicated pelvic sonography.  2. In addition, there is a nonspecific mass of the left adrenal  gland measuring up to 2.8 cm in long axis with marginal stranding.  Adrenal hemorrhage is not excluded although this mass is  nonspecific. Correlate with any risk factors for bleeding.  3. There are at least 4 abnormal hypodense lesions in the spleen.  Possibilities include benign lesions such as hemangioma or  lymphangioma, and malignancy such as lymphoma. Assuming the  patient's GFR does not drop below 30, follow up MRI of the abdomen  with and without contrast could be utilized to assess both the  spleen and the adrenal gland.  4.  Bilateral renal lesions are technically nonspecific although  statistically likely to be cysts.  5. Faint subpleural ground-glass opacities in both lungs. Atypical  pneumonia is suspected.  ____________________________________________   INITIAL IMPRESSION / ASSESSMENT AND PLAN / ED COURSE  Pertinent labs & imaging results that were available during my care of the patient were reviewed by me and considered in my medical decision making (see chart for details).   Patient presents emergency department for left flank pain for the past 24 hours.  Patient did have recent Covid infection diagnosed 11 days ago symptoms started approximately 14 or 15 days ago per patient which is largely resolved at this time per patient.  Patient's lab work is overall reassuring including negative white blood cell count baseline chemistry, normal urinalysis.  Given the patient's left flank pain although largely nontender to palpation we will proceed with CT imaging to further evaluate.  Patient agreeable to plan of care.  Patient does state claustrophobia we will dose a milligram of Ativan orally for the patient.  Patient has a fairly abnormal CT scan.  Patient has stranding along the left ovary.   She has a adrenal gland mass with stranding alongside the adrenal gland mass.  She has 4 abnormal densities within the spleen.  Still has groundglass opacities in lower lungs consistent with a recent Covid infection.  Given the substantial CT findings we will proceed with ultrasound of the pelvis with Dopplers to evaluate her ovary and rule out torsion.  We will proceed with an MR abdomen with contrast to evaluate the adrenal gland as well as spleen to help rule out malignancy or adrenal hemorrhage.  We will dose Ativan for anxiety prior to MRI.  Patient care will be signed out MRI and ultrasound pending.  Jemimah Gunst was evaluated in Emergency Department on 01/01/2019 for the symptoms described in the history of present illness. She was evaluated in the context of the global COVID-19 pandemic, which necessitated consideration that the patient might be at risk for infection with the SARS-CoV-2 virus that causes COVID-19. Institutional protocols and algorithms that pertain to the evaluation of patients at risk for COVID-19 are in a state of rapid change based on information released by regulatory bodies including the CDC and federal and state organizations. These policies and algorithms were followed during the patient's care in the ED.  ____________________________________________   FINAL CLINICAL IMPRESSION(S) / ED DIAGNOSES  Left flank pain   Harvest Dark, MD 01/01/19 2047

## 2019-01-01 NOTE — ED Triage Notes (Signed)
Left side/ flank pain since this am.  + COVID 12/19.  Also c/o nausea.  AAOx3.  Skin warm and dry. NAD

## 2019-01-01 NOTE — ED Provider Notes (Signed)
-----------------------------------------   11:56 PM on 01/01/2019 -----------------------------------------  Assumed care of patient who is pending MRI and ultrasound.  In summary, this is a 68 year old female with recent Covid infection presenting with left-sided flank/abdominal pain.  CT scan abnormal with concerns for left ovarian torsion, adrenal hemorrhage and lesions on the spleen.  MRI was performed to further evaluate adrenal gland and spleen.  Pelvic ultrasound to be performed to further evaluate ovary and torsion.   ----------------------------------------- 1:23 AM on 01/02/2019 -----------------------------------------  [facility] radiology to inquire regarding MRI results - preliminary interpretation of MRI abdomen per Dr. Carlota Raspberry:  Stable left adrenal nodule and splenic nodules; no acute abnormalities  Awaiting pelvic ultrasound results.   ----------------------------------------- 1:48 AM on 01/02/2019 -----------------------------------------  Pelvic ultrasound interpreted per Dr. Joelyn Oms:  1. Heterogeneously enlarged left ovary for postmenopausal patient,  ovarian volume of 51.7. Ovarian parenchyma is heterogeneous. Blood  flow is noted without evidence of torsion. Ovarian neoplasm is  considered, though a discrete cystic or solid mass is difficult to  delineate from diffuse heterogeneity. Recommend gyn consultation.  2. Small volume of pelvic free fluid, nonspecific.  3. Normal quiescent right ovary.  4. Post hysterectomy.   Patient sleeping in no acute distress.  Awakened her to go over results of MRI as well as pelvic ultrasound.  Given there is no acute adrenal hemorrhage, she may follow-up with her PCP for left adrenal nodule and splenic nodules.  She will follow-up with GYN for concern for left ovarian neoplasm.  No torsion noted on ultrasound.  Will discharge home with limited quantity analgesia.  Strict return precautions given.  Patient verbalizes  understanding and agrees with plan of care.   Paulette Blanch, MD 01/02/19 [phone]

## 2019-01-02 MED ORDER — HYDROCODONE-ACETAMINOPHEN 5-325 MG PO TABS: 1.0000 | ORAL_TABLET | Freq: Four times a day (QID) | ORAL | 0 refills | Status: DC | PRN

## 2019-01-02 NOTE — Discharge Instructions (Addendum)
1.  You may take Norco as needed for pain. 2.  Please follow-up with your PCP for further evaluation of nodules on your left adrenal gland and spleen. 3.  Please follow-up with the gynecologist for suspected mass on your left ovary. 4.  Return to the ER for worsening symptoms, persistent vomiting, difficulty breathing or other concerns.

## 2019-01-02 NOTE — ED Notes (Signed)
Patient returned from ultrasound. Patient denies pain at this time.

## 2019-01-08 ENCOUNTER — Other Ambulatory Visit: Payer: Self-pay

## 2019-01-08 ENCOUNTER — Encounter: Payer: Self-pay | Admitting: Family

## 2019-01-08 ENCOUNTER — Ambulatory Visit (INDEPENDENT_AMBULATORY_CARE_PROVIDER_SITE_OTHER): Payer: Medicare PPO | Admitting: Family

## 2019-01-08 VITALS — Ht 65.0 in | Wt 245.0 lb

## 2019-01-08 DIAGNOSIS — E278 Other specified disorders of adrenal gland: Secondary | ICD-10-CM

## 2019-01-08 DIAGNOSIS — I10 Essential (primary) hypertension: Secondary | ICD-10-CM

## 2019-01-08 DIAGNOSIS — N838 Other noninflammatory disorders of ovary, fallopian tube and broad ligament: Secondary | ICD-10-CM | POA: Diagnosis not present

## 2019-01-08 DIAGNOSIS — N189 Chronic kidney disease, unspecified: Secondary | ICD-10-CM | POA: Insufficient documentation

## 2019-01-08 NOTE — Assessment & Plan Note (Signed)
recent blood work, creatinine has increased to 1.49.  In the past has been 1.22 and 1.27.  I am concerned about this increase.  I do suspect dehydration may be playing a role.  Patient and I jointly agreed that she would increase fluids and would recheck this lab in 1 month.  If no significant improvement,  will go ahead and consult nephrology.

## 2019-01-08 NOTE — Assessment & Plan Note (Signed)
Controlled, continue current regimen

## 2019-01-08 NOTE — Progress Notes (Signed)
Verbal consent for services obtained from patient prior to services given to TELEPHONE visit:   Location of call:  provider at work patient at home  Names of all persons present for services: Mable Paris, NP Chief complaint:  Follow-up from emergency room, specifically left adrenal nodule and splenic nodules Left sided back pain has resolved.  Atherosclerosis also noted on MRI  Presented to emergency room 01/01/2019 for left-sided abdominal pain  CT in ED concern for left ovarian torsion, renal hemorrhage and lesions on the spleen. MRI showed normal liver and gallbladder.   left adrenal nodule suspicious and splenic nodules, patchy ill-defined areas in the lung bases likely reflecting sequela of COVID-19 infection  pelvic ultrasound showed a gently enlarged left ovary without evidence of torsion.  Ovarian neoplasm is considered.  Recommend GYN consult. No blood in UA Crt 1.49 Notes poor intake while had COVID. No NSAID.  Appointment with GYN 01/16/19  HTN- at home 119/76. Compliant with medication.Denies exertional chest pain or pressure, numbness or tingling radiating to left arm or jaw, palpitations, dizziness, frequent headaches, changes in vision, or shortness of breath.   History, background, results pertinent:  No h/o cancer.  Recent COVID-19 infection, tested positive 12/21/18. Feels well, minimal cough, improved. No fever, SOB.   A/P/next steps:  Problem List Items Addressed This Visit      Cardiovascular and Mediastinum   Hypertension - Primary    Controlled, continue current regimen      Relevant Orders   Comprehensive metabolic panel-FUTURE     Endocrine   Enlarged ovary    Symptomatically, patient is feeling well.  Pleased as the pain has resolved.  She has upcoming GYN appointment.  Will follow        Genitourinary   CKD (chronic kidney disease)     recent blood work, creatinine has increased to 1.49.  In the past has been 1.22 and 1.27.  I am concerned  about this increase.  I do suspect dehydration may be playing a role.  Patient and I jointly agreed that she would increase fluids and would recheck this lab in 1 month.  If no significant improvement,  will go ahead and consult nephrology.        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  Advised that she has an adrenal nodule and also splenic nodules.  I sent a message to oncologist, Dr. Tasia Catchings and inform her that I placed urgent referral for patient to be evaluated . Will follow.       Relevant Orders   Ambulatory referral to Hematology / Oncology      I spent 25 min  discussing plan of care over the phone.

## 2019-01-08 NOTE — Assessment & Plan Note (Signed)
Symptomatically, patient is feeling well.  Pleased as the pain has resolved.  She has upcoming GYN appointment.  Will follow

## 2019-01-08 NOTE — Assessment & Plan Note (Signed)
Reviewed imaging done in the emergency room the patient today.  Advised that she has an adrenal nodule and also splenic nodules.  I sent a message to oncologist, Dr. Tasia Catchings and inform her that I placed urgent referral for patient to be evaluated . Will follow.

## 2019-01-09 ENCOUNTER — Encounter: Payer: Self-pay | Admitting: Oncology

## 2019-01-09 NOTE — Progress Notes (Signed)
Patient referred by Dr.Arnett for splenic and adrenal nodules. She is aware of why she is coming. Pt was introduced to cancer center services and given the opportunity to ask questions prior to visit. Pt prescreened for appointment. Pt reports testing positive for COVID on 12/19 and is feeling better. No other concerns voiced.

## 2019-01-10 ENCOUNTER — Other Ambulatory Visit: Payer: Self-pay

## 2019-01-10 ENCOUNTER — Inpatient Hospital Stay: Payer: Medicare PPO | Attending: Oncology | Admitting: Oncology

## 2019-01-10 VITALS — BP 136/83 | HR 91 | Temp 97.7°F | Resp 18 | Ht 65.0 in | Wt 251.3 lb

## 2019-01-10 DIAGNOSIS — N838 Other noninflammatory disorders of ovary, fallopian tube and broad ligament: Secondary | ICD-10-CM

## 2019-01-10 DIAGNOSIS — I700 Atherosclerosis of aorta: Secondary | ICD-10-CM | POA: Insufficient documentation

## 2019-01-10 DIAGNOSIS — I10 Essential (primary) hypertension: Secondary | ICD-10-CM | POA: Insufficient documentation

## 2019-01-10 DIAGNOSIS — U071 COVID-19: Secondary | ICD-10-CM

## 2019-01-10 DIAGNOSIS — R5383 Other fatigue: Secondary | ICD-10-CM | POA: Diagnosis not present

## 2019-01-10 DIAGNOSIS — E279 Disorder of adrenal gland, unspecified: Secondary | ICD-10-CM | POA: Diagnosis present

## 2019-01-10 DIAGNOSIS — D7389 Other diseases of spleen: Secondary | ICD-10-CM | POA: Diagnosis not present

## 2019-01-10 DIAGNOSIS — Z79899 Other long term (current) drug therapy: Secondary | ICD-10-CM | POA: Insufficient documentation

## 2019-01-10 DIAGNOSIS — Z87891 Personal history of nicotine dependence: Secondary | ICD-10-CM | POA: Insufficient documentation

## 2019-01-10 DIAGNOSIS — M4316 Spondylolisthesis, lumbar region: Secondary | ICD-10-CM | POA: Diagnosis not present

## 2019-01-10 DIAGNOSIS — D739 Disease of spleen, unspecified: Secondary | ICD-10-CM | POA: Diagnosis not present

## 2019-01-10 DIAGNOSIS — E278 Other specified disorders of adrenal gland: Secondary | ICD-10-CM | POA: Diagnosis not present

## 2019-01-10 DIAGNOSIS — E559 Vitamin D deficiency, unspecified: Secondary | ICD-10-CM | POA: Diagnosis not present

## 2019-01-11 NOTE — Progress Notes (Signed)
Hematology/Oncology Consult note [facility] [phone] [phone]   Patient Care Team: Burnard Hawthorne, FNP as PCP - General (Family Medicine)  REFERRING PROVIDER: Burnard Hawthorne, FNP  CHIEF COMPLAINTS/REASON FOR VISIT:  Evaluation of adrenal mass  HISTORY OF PRESENTING ILLNESS:  Jennifer Huff is a  69 y.o.  female with PMH listed below was seen in consultation at the request of  Burnard Hawthorne, FNP  for evaluation of adrenal mass  Patient recently presented to emergency room on 01/01/2019 complaining left sided abdominal/back pain.  Lasted for about 24 hours, pain started in the left back radiating around to the left lower quadrant. Prior to that she was diagnosed COVID-19 infection on 12/21/2018. Patient had CT renal stone study done on 01/01/2019 With normal expansion, heterogeneously density and stranding with adjacent fluid along the left ovary. In addition, there is no specific mass of the left measuring up to 2.8 cm in long axis with marginal stranding. There is at least 4 abnormal hypodense lesion in the spleen.  Bilateral renal lesions.  Faint subpleural groundglass opacities in both lungs.  01/01/2019 US Pelvis showed a heterogeneously enlarged left ovary for postmenopausal patient.  Ovarian volume 51.7.  Ovarian parenchyma is heterogeneous.  Blood flow without evidence of torsion.  Ovarian neoplasm is considered, though a discrete cystic or solid mass is difficult to delineate from diffuse heterogeneity.  Small volume of pelvic free fluid.  #01/01/2019 MRI abdomen with and without contrast showed no acute findings noted in abdomen to account for patient's symptoms.  2.9 x 1.9 cm left adrenal nodule has suspicious characteristics.  Patchy ill-defined areas of increased signal intensity in the visualized lung bases which likely reflux.  19 infection.  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    Patient was released from the emergency room to follow-up with primary care provider and GYN physician. Patient has an appointment with GYN on 01/16/2019. Patient was referred by primary care provider to cancer center for evaluation of adrenal mass.  Patient was accompanied by her husband.  She denies any additional episodes of left lower quadrant discomfort.  No pain today.  Patient does not make good eye contact with me and she does not provide much history. Denies any unintentional weight loss,  night sweating. She had some respiratory symptoms, loss of taste, fever from COVID-19 infection and reports that most of the symptoms have resolved.  She still feels fatigued.  Appetite is slightly better.  Review of Systems  Constitutional: Positive for fatigue. Negative for appetite change, chills and fever.  HENT:   Negative for hearing loss and voice change.   Eyes: Negative for eye problems.  Respiratory: Negative for chest tightness and cough.   Cardiovascular: Negative for chest pain.  Gastrointestinal: Negative for abdominal distention, abdominal pain and blood in stool.  Endocrine: Negative for hot flashes.  Genitourinary: Negative for difficulty urinating and frequency.   Musculoskeletal: Negative for arthralgias.  Skin: Negative for itching and rash.  Neurological: Negative for extremity weakness.  Hematological: Negative for adenopathy.  Psychiatric/Behavioral: Negative for confusion.    MEDICAL HISTORY:  Past Medical History:  Diagnosis Date  . Cataract   . HTN (hypertension)   . Vitamin D deficiency     SURGICAL HISTORY: Past Surgical History:  Procedure Laterality Date  . KNEE ARTHROSCOPY WITH LATERAL MENISECTOMY Right 11/06/2017   Procedure: KNEE ARTHROSCOPY WITH REPAIR LATERAL MENISCAL ROOT TEAR;  Surgeon: Corky Mull, MD;  Location: [facility] ORS;  Service: Orthopedics;  Laterality: Right;  . LAPAROSCOPIC HYSTERECTOMY      SOCIAL  HISTORY: Social History   Socioeconomic History  . Marital status: Married    Spouse name: Not on file  . Number of children: 3  . Years of education: Not on file  . Highest education level: Not on file  Occupational History  . 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 Smoking status: Former Smoker    Quit date: 12/07/1997    Years since quitting: 21.1  . Smokeless tobacco: Never Used  Substance and Sexual Activity  . Alcohol use: Not Currently  . Drug use: No  . Sexual activity: Not on file  Other Topics Concern  . Not on file  Social History Narrative   Regular Exercise -  NO   Daily Caffeine Use:  2-3 soda/sw tea      Retired   Scientist, physiological Strain: Low Risk   . Difficulty of Paying Living Expenses: Not hard at all  Food Insecurity: No Food Insecurity  . Worried About Charity fundraiser in the Last Year: Never true  . Ran Out of Food in the Last Year: Never true  Transportation Needs: No Transportation Needs  . Lack of Transportation (Medical): No  . Lack of Transportation (Non-Medical): No  Physical Activity: Unknown  . Days of Exercise per Week: 0 days  . Minutes of Exercise per Session: Not on file  Stress: No Stress Concern Present  . Feeling of Stress : Not at all  Social Connections:   . Frequency of Communication with Friends and Family: Not on file  . Frequency of Social Gatherings with Friends and Family: Not on file  . Attends Religious Services: Not on file  . Active Member of Clubs or Organizations: Not on file  . Attends Archivist Meetings: Not on file  . Marital Status: Not on file  Intimate Partner Violence: Not At Risk  . Fear of Current or Ex-Partner: No  . Emotionally Abused: No  . Physically Abused: No  . Sexually Abused: No    FAMILY HISTORY: Family History  Problem Relation Age of Onset  . Kidney disease Father   . Heart disease Father        died from mi  . Cancer  Father        prostate  . Hypertension Sister   . Colon polyps Sister   . Hypertension Brother   . Hypertension Sister   . Diabetes Mother   . Hypertension Mother   . Cancer Paternal Grandmother        head and neck  . Hypertension Brother   . Cancer Son   . Colon cancer Neg Hx   . Breast cancer Neg Hx     ALLERGIES:  is allergic to ace inhibitors and codeine.  MEDICATIONS:  Current Outpatient Medications  Medication Sig Dispense Refill  . acetaminophen (TYLENOL) 500 MG tablet Take 1,000 mg by mouth daily as needed for mild pain.    Marland Kitchen amLODipine (NORVASC) 10 MG tablet Take 1 tablet (10 mg total) by mouth daily. 90 tablet 4  . calcium carbonate (TUMS - DOSED IN MG ELEMENTAL CALCIUM) 500 MG chewable tablet Chew 2 tablets by mouth daily as needed for indigestion or heartburn.    . Cholecalciferol (VITAMIN D3) 2000 units TABS Take 2,000 Units by mouth at bedtime.     Marland Kitchen KETOROLAC TROMETHAMINE  OP Place 1 drop into the left eye 2 (two) times daily as needed (eye pain).    Marland Kitchen losartan (COZAAR) 100 MG tablet Take 1 tablet (100 mg total) by mouth daily. 90 tablet 4  . ondansetron (ZOFRAN ODT) 4 MG disintegrating tablet Take 1 tablet (4 mg total) by mouth every 8 (eight) hours as needed for nausea or vomiting. 30 tablet 0  . pravastatin (PRAVACHOL) 40 MG tablet Take 1 tablet (40 mg total) by mouth daily. 90 tablet 4   No current facility-administered medications for this visit.     PHYSICAL EXAMINATION: ECOG PERFORMANCE STATUS: 1 - Symptomatic but completely ambulatory Vitals:   01/10/19 1041  BP: 136/83  Pulse: 91  Resp: 18  Temp: 97.7 F (36.5 C)   Filed Weights   01/10/19 1041  Weight: 251 lb 4.8 oz (114 kg)    Physical Exam Constitutional:      Appearance: Normal appearance. She is obese.  HENT:     Head: Normocephalic.  Eyes:     General: No scleral icterus. Cardiovascular:     Rate and Rhythm: Normal rate.  Pulmonary:     Effort: Pulmonary effort is normal.   Abdominal:     General: There is no distension.     Palpations: Abdomen is soft.  Musculoskeletal:        General: Normal range of motion.     Cervical back: Normal range of motion.  Skin:    Coloration: Skin is not jaundiced.  Neurological:     General: No focal deficit present.     Mental Status: She is alert.  Psychiatric:     Comments: Flat effect. Poor eye contact.     LABORATORY DATA:  I have reviewed the data as listed Lab Results  Component Value Date   WBC 6.5 01/01/2019   HGB 12.3 01/01/2019   HCT 37.1 01/01/2019   MCV 79.8 (L) 01/01/2019   PLT 242 01/01/2019   Recent Labs    01/25/18 0828 01/01/19 1501  NA 141 143  K 4.5 4.1  CL 108 108  CO2 26 24  GLUCOSE 122* 143*  BUN 19 16  CREATININE 1.22* 1.49*  CALCIUM 10.2 10.1  GFRNONAA  --  36*  GFRAA  --  42*  PROT 6.7 7.6  ALBUMIN 4.2 3.5  AST 13 41  ALT 9 33  ALKPHOS 80 57  BILITOT 0.7 1.2   Iron/TIBC/Ferritin/ %Sat No results found for: IRON, TIBC, FERRITIN, IRONPCTSAT    RADIOGRAPHIC STUDIES: I have personally reviewed the radiological images as listed and agreed with the findings in the report.  MR Abdomen W or Wo Contrast  Result Date: 01/02/2019 CLINICAL DATA:  69 year old female with history of left-sided abdominal pain. EXAM: MRI ABDOMEN WITHOUT AND WITH CONTRAST TECHNIQUE: Multiplanar multisequence MR imaging of the abdomen was performed both before and after the administration of intravenous contrast. CONTRAST:  50mL GADAVIST GADOBUTROL 1 MMOL/ML IV SOLN COMPARISON:  No priors. FINDINGS: Lower chest: Patchy ill-defined areas of increased signal intensity in the visualized lung bases likely reflects sequela of recent COVID-19 infection. Hepatobiliary: No suspicious cystic or solid hepatic lesions. No intra or extrahepatic biliary ductal dilatation. Gallbladder is normal in appearance. Pancreas: No pancreatic mass. No pancreatic ductal dilatation. No pancreatic or peripancreatic fluid  collections or inflammatory changes. Spleen: Multiple splenic lesions are noted measuring up to 3 cm in diameter, demonstrating T1 hypointensity, heterogeneous T2 hyperintensity, multiple internal thin septations which demonstrate mild septal enhancement on post gadolinium imaging.  Per report from the technologist, the patient was repeatedly falling asleep, so post gadolinium imaging was limited to only early phases. Within these limitations, these lesions do not appear to demonstrate centripetal filling. These lesions do not restrict diffusion. Adrenals/Urinary Tract: Multiple T1 hypointense, T2 hyperintense, nonenhancing lesions in both kidneys, compatible with simple cysts, largest of which measures 2.6 cm in the interpolar region of the right kidney. No suspicious renal lesions. No hydroureteronephrosis in the visualized portions of the abdomen. Right adrenal gland is normal in appearance. 2.9 x 1.9 cm left adrenal nodule which demonstrates minimally increased T1 signal intensity, is isointense on T2 weighted images, clearly enhances and demonstrates diffusion restriction. Stomach/Bowel: Visualized portions are unremarkable. Vascular/Lymphatic: Aortic atherosclerosis, without evidence of aneurysm in the abdominal vasculature. No lymphadenopathy noted in the abdomen. Other: No significant volume of ascites in the visualized portions of the peritoneal cavity. Musculoskeletal: No aggressive appearing osseous lesions are noted in the visualized portions of the skeleton. IMPRESSION: 1. No acute findings are noted in the abdomen to account for the patient's symptoms. 2. 2.9 x 1.9 cm left adrenal nodule which has imaging characteristics suspicious for bone metastasis. 3. Patchy ill-defined areas of increased signal intensity in the visualized lung bases which likely reflects sequela of COVID-19 infection. 4. 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 5.  Aortic atherosclerosis. Electronically Signed   By: Vinnie Langton M.D.   On: 01/02/2019 08:26   CT Renal Stone Study  Result Date: 01/01/2019 CLINICAL DATA:  Left flank pain.  Positive COVID test on 12/21/2018 EXAM: CT ABDOMEN AND PELVIS WITHOUT CONTRAST TECHNIQUE: Multidetector CT imaging of the abdomen and pelvis was performed following the standard protocol without IV contrast. COMPARISON:  None. FINDINGS: Lower chest: Faint subpleural ground-glass opacities in both lung bases. Bandlike subsegmental atelectasis in both lower lobes. Hepatobiliary: Unremarkable Pancreas: Unremarkable Spleen: At least 4 hypodense lesions in the spleen are identified. An index lesion measuring 3.4 by 2.6 cm on image 20/2 as a precontrast internal density of 22 Hounsfield units, which is greater than fluid density. No splenomegaly. Adrenals/Urinary Tract: 2.5 by 2.1 by 2.8 cm left adrenal mass, precontrast internal density 32 Hounsfield units, slightly indistinct margins. Faint haziness around the right adrenal gland without right adrenal mass. Hypodense bilateral renal lesions are not assessed for enhancement and accordingly are technically nonspecific, although probably cysts. The left ureter is partially obscured in the vicinity of the abnormal left adnexa. However, there is no hydronephrosis or hydroureter. Stomach/Bowel: Small diverticulum of the transverse duodenum does not appear inflamed. Normal appendix. Vascular/Lymphatic: Mild abdominal aortic atherosclerotic vascular calcification. Reproductive: Uterus absent. Along the left ovary/left adnexa, there is heterogeneous density, marginal stranding, and expansion of presumed ovarian tissues, currently measuring 4.9 by 3.4 by 7.8 cm. Small amount of adjacent free fluid tracking into the cul-de-sac. Right ovary unremarkable. Other: No supplemental non-categorized findings. Musculoskeletal: Grade 1 degenerative anterolisthesis at L4-5. Lower lumbar spondylosis and  degenerative disc disease. IMPRESSION: 1. Abnormal expansion, heterogeneous density, and marginal stranding with adjacent fluid along the left ovary. Possibilities include hemorrhage, mass, or a vascular abnormality such as torsion. Consider dedicated pelvic sonography. 2. In addition, there is a nonspecific mass of the left adrenal gland measuring up to 2.8 cm in long axis with marginal stranding. Adrenal hemorrhage is not excluded although this mass is nonspecific. Correlate with any risk factors for bleeding. 3. There are at least 4 abnormal hypodense lesions in the spleen. Possibilities include benign lesions such  as hemangioma or lymphangioma, and malignancy such as lymphoma. Assuming the patient's GFR does not drop below 30, follow up MRI of the abdomen with and without contrast could be utilized to assess both the spleen and the adrenal gland. 4. Bilateral renal lesions are technically nonspecific although statistically likely to be cysts. 5. Faint subpleural ground-glass opacities in both lungs. Atypical pneumonia is suspected. Electronically Signed   By: Van Clines M.D.   On: 01/01/2019 18:55   US PELVIC COMPLETE W TRANSVAGINAL AND TORSION R/O  Result Date: 01/02/2019 CLINICAL DATA:  69 year old with left lower quadrant pain. EXAM: TRANSABDOMINAL AND TRANSVAGINAL ULTRASOUND OF PELVIS DOPPLER ULTRASOUND OF OVARIES TECHNIQUE: Both transabdominal and transvaginal ultrasound examinations of the pelvis were performed. Transabdominal technique was performed for global imaging of the pelvis including uterus, ovaries, adnexal regions, and pelvic cul-de-sac. It was necessary to proceed with endovaginal exam following the transabdominal exam to visualize the ovaries and adnexa. Color and duplex Doppler ultrasound was utilized to evaluate blood flow to the ovaries. COMPARISON:  Noncontrast abdominal CT yesterday. FINDINGS: Uterus Surgically absent. Endometrium Uterus surgically absent. Right ovary  Measurements: 2.6 x 0.8 x 1.7 cm = volume: 1.9 mL. Normal appearance/no adnexal mass. Normal blood flow. Left ovary Measurements: 7.3 x 3.5 x 3.8 cm = volume: 51.7 mL. Ovaries best assessed transabdominally given high positioning in the pelvis. Ovarian parenchyma is diffusely heterogeneous with focal calcification, as seen on CT. Difficult to delineate focal mass from heterogeneous parenchyma. Blood flow is noted with normal arterial and venous flow. Pulsed Doppler evaluation of both ovaries demonstrates normal low-resistance arterial and venous waveforms. Other findings Small volume of free fluid in the pelvis. IMPRESSION: 1. Heterogeneously enlarged left ovary for postmenopausal patient, ovarian volume of 51.7. Ovarian parenchyma is heterogeneous. Blood flow is noted without evidence of torsion. Ovarian neoplasm is considered, though a discrete cystic or solid mass is difficult to delineate from diffuse heterogeneity. Recommend gyn consultation. 2. Small volume of pelvic free fluid, nonspecific. 3. Normal quiescent right ovary. 4. Post hysterectomy. Electronically Signed   By: Keith Rake M.D.   On: 01/02/2019 01:22      ASSESSMENT & PLAN:  1. Adrenal mass (HCC)   2. Enlarged ovary   3. Lesion of spleen   4. COVID-19 virus detected    #Images were independently reviewed and discussed with patient. I recommend chest CT to complete her work-up.-Delay 2 to 3 weeks given most recent COVID-19 infection.  Differentials include adrenal metastasis pheochromocytoma, adrenal cortical carcinoma,benign adenoma, etc. patient has normal potassium level, blood pressure is well controlled with amlodipine, losartan--no obvious signs of primary aldosteronism. Check plasmal metanephrine.  Obtain urology revaluation.   #Enlarged ovary, pending GYN evaluation. 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  Attention on follow-up. #Recent COVID-19 infection.  Her symptoms  are mostly resolved. CT renal study and MRI abdomen showed lower chest patchy ill-defined areas, likely secondary to COVID-19 infection.  Orders Placed This Encounter  Procedures  . CT Chest Wo Contrast    Standing Status:   Future    Standing Expiration Date:   01/10/2020    Order Specific Question:   ** REASON FOR EXAM (FREE TEXT)    Answer:   adrenal mass workup    Order Specific Question:   Preferred imaging location?    Answer:   [facility]    Order Specific Question:   Radiology Contrast Protocol - do NOT remove file path    Answer:   [email or web address]  .  Ambulatory referral to Urology    Referral Priority:   Routine    Referral Type:   Consultation    Referral Reason:   Specialty Services Required    Referred to Provider:   Hollice Espy, MD    Requested Specialty:   Urology    Number of Visits Requested:   1    All questions were answered. The patient knows to call the clinic with any problems questions or concerns.  cc Burnard Hawthorne, FNP    Return of visit: To be determined Thank you for this kind referral and the opportunity to participate in the care of this patient. A copy of today's note is routed to referring provider   Earlie Server, MD, PhD Hematology Oncology [facility] Pager- [phone] 01/11/2019

## 2019-01-13 ENCOUNTER — Inpatient Hospital Stay: Payer: Medicare PPO

## 2019-01-13 ENCOUNTER — Other Ambulatory Visit: Payer: Self-pay

## 2019-01-13 DIAGNOSIS — E279 Disorder of adrenal gland, unspecified: Secondary | ICD-10-CM | POA: Diagnosis not present

## 2019-01-13 DIAGNOSIS — E278 Other specified disorders of adrenal gland: Secondary | ICD-10-CM

## 2019-01-17 LAB — METANEPHRINES, PLASMA
Metanephrine, Free: 34.8 pg/mL (ref 0.0–88.0)
Normetanephrine, Free: 106.9 pg/mL (ref 0.0–191.8)

## 2019-01-24 ENCOUNTER — Other Ambulatory Visit: Payer: Self-pay

## 2019-01-24 ENCOUNTER — Ambulatory Visit
Admission: RE | Admit: 2019-01-24 | Discharge: 2019-01-24 | Disposition: A | Discharge status: Home / Self Care | Payer: Medicare PPO | Source: Ambulatory Visit | Attending: Oncology | Admitting: Oncology

## 2019-01-24 DIAGNOSIS — I700 Atherosclerosis of aorta: Secondary | ICD-10-CM | POA: Insufficient documentation

## 2019-01-24 DIAGNOSIS — Z8616 Personal history of COVID-19: Secondary | ICD-10-CM | POA: Insufficient documentation

## 2019-01-24 DIAGNOSIS — J439 Emphysema, unspecified: Secondary | ICD-10-CM | POA: Insufficient documentation

## 2019-01-24 DIAGNOSIS — E278 Other specified disorders of adrenal gland: Secondary | ICD-10-CM | POA: Insufficient documentation

## 2019-01-30 ENCOUNTER — Telehealth: Payer: Self-pay

## 2019-01-30 NOTE — Telephone Encounter (Signed)
Call placed to [name]. Dr. Leafy Ro would like for her to consult with gyn onc for an enlarged left ovary. Ovarian parenchyma is heterogeneous. Educated on reason for this referral and what to expect at the appointment.  Appointment arranged for 2/3 at 0830 with Dr. Theora Gianotti. Directions to the cancer center given.

## 2019-02-05 ENCOUNTER — Inpatient Hospital Stay: Payer: Medicare PPO | Attending: Obstetrics and Gynecology | Admitting: Obstetrics and Gynecology

## 2019-02-05 ENCOUNTER — Other Ambulatory Visit: Payer: Self-pay

## 2019-02-05 VITALS — BP 140/77 | HR 90 | Temp 97.4°F | Resp 20 | Wt 254.9 lb

## 2019-02-05 DIAGNOSIS — I129 Hypertensive chronic kidney disease with stage 1 through stage 4 chronic kidney disease, or unspecified chronic kidney disease: Secondary | ICD-10-CM | POA: Insufficient documentation

## 2019-02-05 DIAGNOSIS — D3912 Neoplasm of uncertain behavior of left ovary: Secondary | ICD-10-CM

## 2019-02-05 DIAGNOSIS — E559 Vitamin D deficiency, unspecified: Secondary | ICD-10-CM | POA: Insufficient documentation

## 2019-02-05 DIAGNOSIS — E782 Mixed hyperlipidemia: Secondary | ICD-10-CM | POA: Insufficient documentation

## 2019-02-05 DIAGNOSIS — Z8616 Personal history of COVID-19: Secondary | ICD-10-CM | POA: Insufficient documentation

## 2019-02-05 DIAGNOSIS — Z79899 Other long term (current) drug therapy: Secondary | ICD-10-CM | POA: Insufficient documentation

## 2019-02-05 DIAGNOSIS — Z6841 Body Mass Index (BMI) 40.0 and over, adult: Secondary | ICD-10-CM | POA: Insufficient documentation

## 2019-02-05 DIAGNOSIS — N189 Chronic kidney disease, unspecified: Secondary | ICD-10-CM | POA: Insufficient documentation

## 2019-02-05 DIAGNOSIS — E7800 Pure hypercholesterolemia, unspecified: Secondary | ICD-10-CM | POA: Insufficient documentation

## 2019-02-05 DIAGNOSIS — N9489 Other specified conditions associated with female genital organs and menstrual cycle: Secondary | ICD-10-CM

## 2019-02-05 DIAGNOSIS — Z87891 Personal history of nicotine dependence: Secondary | ICD-10-CM | POA: Insufficient documentation

## 2019-02-05 NOTE — Progress Notes (Signed)
New pt referred by Dr Leafy Ro for enlarged left ovary.

## 2019-02-05 NOTE — Progress Notes (Signed)
Gynecologic Oncology Consult Visit   Referring Provider: Benjaman Kindler, MD  Chief Complaint: left adnexal mass.   Subjective:  Jennifer Huff is a 69 y.o. G3P3 female s/p VH (menorrhagia ~2000, ovaries in situ per patient) who is seen in consultation from Dr. Leafy Ro for left adnexal mass.   She presented initially to ER on 01/01/2019 with left-sided pelvic pain and flank pain. She has also had a recent COVID-19 infection, dx on 12/19.  CT Renal Stone Study 01/01/2019 showed left ovary/left adnexa, heterogeneous density, marginal stranding, and expansion of presumed ovarian tissues, currently measuring 4.9 x 3.4 x 7.8 cm, small amount of adjacent free fluid tracking into cul-de-sac. Right ovary unremarkable. Additionally, incidental left adrenal mass measuring up to 2.8 cm in long axis with marginal stranding noted. 4 abnormal hypodense lesions in the spleen, bilateral renal lesions nonspecific, faint subpleural ground glass opacities suspicious for atypical pneumonia.   MRI Abdomen 01/01/2019  1. No acute findings are noted in the abdomen to account for the patient's symptoms. 2. 2.9 x 1.9 cm left adrenal nodule which has imaging characteristics suspicious for bone metastasis. 3. Patchy ill-defined areas of increased signal intensity in the visualized lung bases which likely reflects sequela of COVID-19 infection. 4. 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 5. Aortic atherosclerosis.  Pelvic Ultrasound 12/30: Uterus surgically absent  -Ovaries best assessed transabdominally given high positioning in the pelvis. RO: 2.6 x 0.8 x 1.7 cm = 1.9 mL; normal appearance/no adnexal mass, normal blood flow - LO: 7.3 x 3.5 x 3.8 cm = 51.7 mL; Ovarian parenchyma is diffusely heterogenous with focal calcification, as seen on CT. Difficult to delineate focal mass from heterogenous parenchyma. Blood flow is noted with normal arterial  and venous flow.  - Small volume of free fluid in the pelvis - Radiologist noted: Ovarian neoplasm is considered, though a discrete cystic or solid mass is difficult to delineate from diffuse heterogeneity. Recommend gyn consultation.  Tumor Markers:  CA 125 - 14.1 HE4- <15  She has seen Oncology on 01/10/2019 for the several abnormal findings on ER imaging, including non-gyn. They reviewed pt's imaging results and in regards of her ovarian mass, gyn evaluation was recommended and by this time patient already had her appointment with [place]. She presents today and is asymptomatic. She has no pelvic or abdominal pain. She has a long history of constipation and no change in her bowel habits.    Problem List: Patient Active Problem List   Diagnosis Date Noted  . Enlarged ovary 01/08/2019  . Adrenal mass ([place]) 01/08/2019  . CKD (chronic kidney disease) 01/08/2019  . Decreased appetite 12/30/2018  . COVID-19 virus detected 12/30/2018  . Status post arthroscopic surgery of right knee 11/20/2017  . Complex tear of lateral meniscus of right knee as current injury 11/09/2017  . Primary osteoarthritis of right knee 11/09/2017  . BMI 40.0-44.9, adult ([place]) 11/04/2017  . Chest pain with moderate risk for cardiac etiology 04/26/2017  . Borderline diabetes 04/26/2017  . Mixed hyperlipidemia 04/26/2017  . Former smoker 04/25/2017  . Anxiety and depression 04/11/2017  . Dry skin 06/29/2015  . Postmenopausal estrogen deficiency 12/23/2014  . Hemorrhoid 12/23/2014  . Screening for breast cancer 06/23/2014  . Allergic rhinitis 06/23/2014  . Routine general medical examination at a health care facility 12/16/2012  . Hypertension 12/08/2010  . Morbid obesity ([place]) 12/08/2010    Past Medical History: Past Medical History:  Diagnosis Date  . Cataract   .  HTN (hypertension)   . Hypercholesteremia   . Vitamin D deficiency     Past Surgical History: Past Surgical History:  Procedure Laterality  Date  . KNEE ARTHROSCOPY WITH LATERAL MENISECTOMY Right 11/06/2017   Procedure: KNEE ARTHROSCOPY WITH REPAIR LATERAL MENISCAL ROOT TEAR;  Surgeon: Corky Mull, MD;  Location: [facility] ORS;  Service: Orthopedics;  Laterality: Right;  . LAPAROSCOPIC HYSTERECTOMY      Past Gynecologic History:  Menstrual details: h/o menorrhagia Last Menstrual Period: 20 years ago Mammogram 08/2018 normal per patient   OB History:  OB History  Gravida Para Term Preterm AB Living  3 3          SAB TAB Ectopic Multiple Live Births               # Outcome Date GA Lbr Len/2nd Weight Sex Delivery Anes PTL Lv  3 Para           2 Para           1 Para             Obstetric Comments  NSVD x 3 no complications; h/o cerclages for last two pregnancies    Family History: Family History  Problem Relation Age of Onset  . Kidney disease Father   . Heart disease Father        died from mi  . Cancer Father        prostate  . Hypertension Sister   . Colon polyps Sister   . Hypertension Brother   . Hypertension Sister   . Diabetes Mother   . Hypertension Mother   . Cancer Paternal Grandmother        head and neck  . Hypertension Brother   . Cancer Son   . Colon cancer Neg Hx   . Breast cancer Neg Hx     Social History: Social History   Socioeconomic History  . Marital status: Married    Spouse name: Not on file  . Number of children: 3  . Years of education: Not on file  . Highest education level: Not on file  Occupational History  . 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 Smoking status: Former Smoker    Quit date: 12/07/1997    Years since quitting: 21.1  . Smokeless tobacco: Never Used  Substance and Sexual Activity  . Alcohol use: Not Currently  . Drug use: No  . Sexual activity: Not on file  Other Topics Concern  . Not on file  Social History Narrative   Regular Exercise -  NO   Daily Caffeine Use:  2-3 soda/sw tea      Retired   Curator Strain: Low Risk   . Difficulty of Paying Living Expenses: Not hard at all  Food Insecurity: No Food Insecurity  . Worried About Charity fundraiser in the Last Year: Never true  . Ran Out of Food in the Last Year: Never true  Transportation Needs: No Transportation Needs  . Lack of Transportation (Medical): No  . Lack of Transportation (Non-Medical): No  Physical Activity: Unknown  . Days of Exercise per Week: 0 days  . Minutes of Exercise per Session: Not on file  Stress: No Stress Concern Present  . Feeling of Stress : Not at all  Social Connections:   . Frequency of Communication with Friends and Family: Not on file  . Frequency of Social Gatherings  with Friends and Family: Not on file  . Attends Religious Services: Not on file  . Active Member of Clubs or Organizations: Not on file  . Attends Archivist Meetings: Not on file  . Marital Status: Not on file  Intimate Partner Violence: Not At Risk  . Fear of Current or Ex-Partner: No  . Emotionally Abused: No  . Physically Abused: No  . Sexually Abused: No    Allergies: Allergies  Allergen Reactions  . Ace Inhibitors Swelling    Lips swelled.  . Codeine Other (See Comments)    Jittery     Current Medications: Current Outpatient Medications  Medication Sig Dispense Refill  . acetaminophen (TYLENOL) 500 MG tablet Take 1,000 mg by mouth daily as needed for mild pain.    Marland Kitchen amLODipine (NORVASC) 10 MG tablet Take 1 tablet (10 mg total) by mouth daily. 90 tablet 4  . calcium carbonate (TUMS - DOSED IN MG ELEMENTAL CALCIUM) 500 MG chewable tablet Chew 2 tablets by mouth daily as needed for indigestion or heartburn.    . Cholecalciferol (VITAMIN D3) 2000 units TABS Take 2,000 Units by mouth at bedtime.     Marland Kitchen KETOROLAC TROMETHAMINE OP Place 1 drop into the left eye 2 (two) times daily as needed (eye pain).    Marland Kitchen losartan (COZAAR) 100 MG tablet Take 1 tablet (100 mg total) by mouth daily. 90 tablet 4   . ondansetron (ZOFRAN ODT) 4 MG disintegrating tablet Take 1 tablet (4 mg total) by mouth every 8 (eight) hours as needed for nausea or vomiting. 30 tablet 0  . pravastatin (PRAVACHOL) 40 MG tablet Take 1 tablet (40 mg total) by mouth daily. 90 tablet 4   No current facility-administered medications for this visit.   Review of Systems  General: negative for fevers, changes in weight or night sweats Skin: negative for changes in moles or sores or rash Eyes: negative for changes in vision HEENT: negative for change in hearing, tinnitus, voice changes Pulmonary: negative for dyspnea, orthopnea, productive cough, wheezing Cardiac: negative for palpitations, pain Gastrointestinal: constipation o/w negative for nausea, vomiting, diarrhea, hematemesis, hematochezia Genitourinary/Sexual: negative for dysuria, retention, hematuria, incontinence Ob/Gyn:  negative for abnormal bleeding, or pain Musculoskeletal: negative for pain, joint pain, back pain Hematology: negative for easy bruising, abnormal bleeding Neurologic/Psych: negative for headaches, seizures, paralysis, weakness, numbness   Objective:  Physical Examination:  BP 140/77 (BP Location: Right Arm, Patient Position: Sitting)   Pulse 90   Temp (!) 97.4 F (36.3 C) (Tympanic)   Resp 20   Wt 254 lb 14.4 oz (115.6 kg)   SpO2 99%   BMI 42.42 kg/m     ECOG Performance Status: 1 - Symptomatic but completely ambulatory  GENERAL: Patient is a well appearing female in no acute distress HEENT:  PERRL, neck supple with midline trachea. Thyroid without masses.  NODES:  No cervical, supraclavicular, axillary, or inguinal lymphadenopathy palpated.  LUNGS:  Clear to auscultation bilaterally.  No wheezes or rhonchi. HEART:  Regular rate and rhythm. No murmur appreciated. ABDOMEN:  Soft, nontender, obese.  No ascites or pelvic masses; exam limited by habitus. EXTREMITIES:  No peripheral edema.   SKIN:  Clear with no obvious rashes or skin  changes. No nail dyscrasia. NEURO:  Nonfocal. Well oriented.  Appropriate affect.  Pelvic: EGBUS: no lesions Cervix: surgically absent Vagina: no lesions, no discharge or bleeding Uterus: surgically absent Adnexa: no palpable masses but very limited exam Rectovaginal: deferred   Lab Review No labs  on site today  Radiologic Imaging: Imaging ordered    Assessment:  Jennifer Huff is a 69 y.o. female diagnosed with asymptomatic enlarged ovary/pelvic mass with normal tumor markers. On ultrasound mass is diffusely heterogeneous with focal calcification, which may suggest a dermoid.   Medical co-morbidities complicating care: HTN, prior surgery, Body mass index is 42.42 kg/m. Plan:   Problem List Items Addressed This Visit    None    Visit Diagnoses    Adnexal mass    -  Primary   Relevant Orders   US PELVIC COMPLETE WITH TRANSVAGINAL      We discussed options for management. Given she is now asymptomatic and we can not palpate the mass on exam I recommended repeat ultrasound. Plan for scan this Monday. Patient will be seen next week to discuss results. If she needs surgery, an OR date has been tentatively scheduled on 02/19/2019 with Dr. Leafy Ro. Plan for robotic approach. The differential includes benign dermoid, malignancy (either malignant dermoid tumor, sex cord stromal tumor or epithelial ovarian which are less likely), or benign ovarian cyst.   Suggested return to clinic in  2 weeks.    The patient's diagnosis, an outline of the further diagnostic and laboratory studies which will be required, the recommendation for surgery, and alternatives were discussed with her and her accompanying family members.  All questions were answered to their satisfaction.  A total of 60 minutes were spent with the patient/family today; >50% was spent in education, counseling and coordination of care for asymptomatic enlarged ovary/pelvic mass. I personally reviewed the records, obtained  the patient's history, and performed all components of the physical exam. Beckey Rutter, NP helped scribe the note.    Izen Petz Gaetana Michaelis, MD   CC:  Benjaman Kindler, MD

## 2019-02-10 ENCOUNTER — Other Ambulatory Visit: Payer: Self-pay

## 2019-02-10 ENCOUNTER — Ambulatory Visit
Admission: RE | Admit: 2019-02-10 | Discharge: 2019-02-10 | Disposition: A | Discharge status: Home / Self Care | Payer: Medicare PPO | Source: Ambulatory Visit | Attending: Nurse Practitioner | Admitting: Nurse Practitioner

## 2019-02-10 DIAGNOSIS — N9489 Other specified conditions associated with female genital organs and menstrual cycle: Secondary | ICD-10-CM

## 2019-02-11 ENCOUNTER — Ambulatory Visit (INDEPENDENT_AMBULATORY_CARE_PROVIDER_SITE_OTHER): Payer: Medicare PPO | Admitting: Urology

## 2019-02-11 ENCOUNTER — Encounter: Payer: Self-pay | Admitting: Urology

## 2019-02-11 VITALS — BP 148/82 | HR 86 | Ht 65.0 in | Wt 256.0 lb

## 2019-02-11 DIAGNOSIS — N838 Other noninflammatory disorders of ovary, fallopian tube and broad ligament: Secondary | ICD-10-CM

## 2019-02-11 DIAGNOSIS — E278 Other specified disorders of adrenal gland: Secondary | ICD-10-CM | POA: Diagnosis not present

## 2019-02-11 LAB — URINALYSIS, COMPLETE
Bilirubin, UA: NEGATIVE
Glucose, UA: NEGATIVE
Ketones, UA: NEGATIVE
Leukocytes,UA: NEGATIVE
Nitrite, UA: NEGATIVE
Protein,UA: NEGATIVE
RBC, UA: NEGATIVE
Specific Gravity, UA: 1.02 (ref 1.005–1.030)
Urobilinogen, Ur: 0.2 mg/dL (ref 0.2–1.0)
pH, UA: 6 (ref 5.0–7.5)

## 2019-02-11 LAB — MICROSCOPIC EXAMINATION: RBC, Urine: NONE SEEN /HPF (ref 0–2)

## 2019-02-11 NOTE — Progress Notes (Signed)
02/11/2019 12:51 PM   Jennifer Huff [birth year] [id number]  Referring provider: Burnard Hawthorne, FNP [address] [place],  [place] [address]  Chief Complaint  Patient presents with  . Adrenal Mass    HPI: 69 year old female who presents today for further evaluation of incidental left adrenal nodule.  Notably, she was seen in the emergency room in late December with left back/flank pain.  She had a CT scan which was negative for any significant renal pathology or stones however an incidental 2.8 cm left adrenal nodule was appreciated as well as concern for stranding associated with the left ovary.  CT scan was followed up with an MRI of the abdomen with and without contrast on 01/02/2019.  This showed a 2.9 x 1.9 cm left adrenal nodule as well as sequela of her recent COVID-19 infection.  Several simple renal cysts are also identified.  Since his emergency room visit, she has been seen and evaluated by her oncologist Dr. Tasia Catchings.  She underwent an abbreviated metabolic evaluation for possible metabolically active adrenal adenoma including normal metanephrines, potassium, etc.  She is also been seen and evaluated by Dr. Theora Gianotti for possible left ovarian pathology.  She is seeing her tomorrow in follow-up after repeat pelvic ultrasound.    She is asymptomatic.  No further episodes of flank pain.  No urinary issues.  No dysuria or gross hematuria.  No incidence of flushing, headaches, severe or poorly controlled hypertension or potassium issues.   PMH: Past Medical History:  Diagnosis Date  . Cataract   . HTN (hypertension)   . Hypercholesteremia   . Vitamin D deficiency     Surgical History: Past Surgical History:  Procedure Laterality Date  . KNEE ARTHROSCOPY WITH LATERAL MENISECTOMY Right 11/06/2017   Procedure: KNEE ARTHROSCOPY WITH REPAIR LATERAL MENISCAL ROOT TEAR;  Surgeon: Corky Mull, MD;  Location: [facility] ORS;  Service: Orthopedics;  Laterality: Right;   . LAPAROSCOPIC HYSTERECTOMY      Home Medications:  Allergies as of 02/11/2019      Reactions   Ace Inhibitors Swelling   Lips swelled.   Codeine Other (See Comments)   Jittery       Medication List       Accurate as of February 11, 2019 12:51 PM. If you have any questions, ask your nurse or doctor.        acetaminophen 500 MG tablet Commonly known as: TYLENOL Take 1,000 mg by mouth daily as needed for mild pain.   amLODipine 10 MG tablet Commonly known as: NORVASC Take 1 tablet (10 mg total) by mouth daily.   calcium carbonate 500 MG chewable tablet Commonly known as: TUMS - dosed in mg elemental calcium Chew 2 tablets by mouth daily as needed for indigestion or heartburn.   KETOROLAC TROMETHAMINE OP Place 1 drop into the left eye 2 (two) times daily as needed (eye pain).   losartan 100 MG tablet Commonly known as: COZAAR Take 1 tablet (100 mg total) by mouth daily.   ondansetron 4 MG disintegrating tablet Commonly known as: Zofran ODT Take 1 tablet (4 mg total) by mouth every 8 (eight) hours as needed for nausea or vomiting.   pravastatin 40 MG tablet Commonly known as: PRAVACHOL Take 1 tablet (40 mg total) by mouth daily.   Vitamin D3 50 MCG (2000 UT) Tabs Take 2,000 Units by mouth at bedtime.       Allergies:  Allergies  Allergen Reactions  . Ace Inhibitors Swelling  Lips swelled.  . Codeine Other (See Comments)    Jittery     Family History: Family History  Problem Relation Age of Onset  . Kidney disease Father   . Heart disease Father        died from mi  . Cancer Father        prostate  . Hypertension Sister   . Colon polyps Sister   . Hypertension Brother   . Hypertension Sister   . Diabetes Mother   . Hypertension Mother   . Cancer Paternal Grandmother        head and neck  . Hypertension Brother   . Cancer Son   . Colon cancer Neg Hx   . Breast cancer Neg Hx     Social History:  reports that she quit smoking about 21 years  ago. She has never used smokeless tobacco. She reports previous alcohol use. She reports that she does not use drugs.  ROS: UROLOGY Frequent Urination?: No Hard to postpone urination?: No Burning/pain with urination?: No Get up at night to urinate?: No Leakage of urine?: No Urine stream starts and stops?: No Trouble starting stream?: No Do you have to strain to urinate?: No Blood in urine?: No Urinary tract infection?: No Sexually transmitted disease?: No Injury to kidneys or bladder?: No Painful intercourse?: No Weak stream?: No Currently pregnant?: No Vaginal bleeding?: No Last menstrual period?: n  Gastrointestinal Nausea?: No Vomiting?: No Indigestion/heartburn?: No Diarrhea?: No Constipation?: No  Constitutional Fever: No Night sweats?: No Weight loss?: No Fatigue?: No  Skin Skin rash/lesions?: No Itching?: No  Eyes Blurred vision?: No Double vision?: No  Ears/Nose/Throat Sore throat?: No Sinus problems?: No  Hematologic/Lymphatic Swollen glands?: No Easy bruising?: No  Cardiovascular Leg swelling?: No Chest pain?: No  Respiratory Cough?: No Shortness of breath?: No  Endocrine Excessive thirst?: No  Musculoskeletal Back pain?: No Joint pain?: No  Neurological Headaches?: No Dizziness?: No  Psychologic Depression?: No Anxiety?: No  Physical Exam: BP (!) 148/82   Pulse 86   Ht 5\' 5"  (1.651 m)   Wt 256 lb (116.1 kg)   BMI 42.60 kg/m   Constitutional:  Alert and oriented, No acute distress. HEENT: [place] AT, moist mucus membranes.  Trachea midline, no masses. Cardiovascular: No clubbing, cyanosis, or edema. Respiratory: Normal respiratory effort, no increased work of breathing. Skin: No rashes, bruises or suspicious lesions. Neurologic: Grossly intact, no focal deficits, moving all 4 extremities. Psychiatric: Normal mood and affect.  Laboratory Data: Lab Results  Component Value Date   WBC 6.5 01/01/2019   HGB 12.3 01/01/2019    HCT 37.1 01/01/2019   MCV 79.8 (L) 01/01/2019   PLT 242 01/01/2019    Lab Results  Component Value Date   CREATININE 1.49 (H) 01/01/2019    Lab Results  Component Value Date   HGBA1C 5.9 01/25/2018    Urinalysis    Component Value Date/Time   COLORURINE YELLOW (A) 01/01/2019 1501   APPEARANCEUR HAZY (A) 01/01/2019 1501   LABSPEC 1.012 01/01/2019 1501   PHURINE 5.0 01/01/2019 1501   GLUCOSEU NEGATIVE 01/01/2019 1501   HGBUR NEGATIVE 01/01/2019 1501   BILIRUBINUR NEGATIVE 01/01/2019 1501   KETONESUR NEGATIVE 01/01/2019 1501   PROTEINUR 30 (A) 01/01/2019 1501   NITRITE NEGATIVE 01/01/2019 1501   LEUKOCYTESUR NEGATIVE 01/01/2019 1501    Lab Results  Component Value Date   BACTERIA NONE SEEN 01/01/2019    Pertinent Imaging: Results for orders placed during the hospital encounter of 01/01/19  CT Renal Stone Study   Narrative CLINICAL DATA:  Left flank pain.  Positive COVID test on 12/21/2018  EXAM: CT ABDOMEN AND PELVIS WITHOUT CONTRAST  TECHNIQUE: Multidetector CT imaging of the abdomen and pelvis was performed following the standard protocol without IV contrast.  COMPARISON:  None.  FINDINGS: Lower chest: Faint subpleural ground-glass opacities in both lung bases. Bandlike subsegmental atelectasis in both lower lobes.  Hepatobiliary: Unremarkable  Pancreas: Unremarkable  Spleen: At least 4 hypodense lesions in the spleen are identified. An index lesion measuring 3.4 by 2.6 cm on image 20/2 as a precontrast internal density of 22 Hounsfield units, which is greater than fluid density. No splenomegaly.  Adrenals/Urinary Tract: 2.5 by 2.1 by 2.8 cm left adrenal mass, precontrast internal density 32 Hounsfield units, slightly indistinct margins. Faint haziness around the right adrenal gland without right adrenal mass.  Hypodense bilateral renal lesions are not assessed for enhancement and accordingly are technically nonspecific, although  probably cysts.  The left ureter is partially obscured in the vicinity of the abnormal left adnexa. However, there is no hydronephrosis or hydroureter.  Stomach/Bowel: Small diverticulum of the transverse duodenum does not appear inflamed. Normal appendix.  Vascular/Lymphatic: Mild abdominal aortic atherosclerotic vascular calcification.  Reproductive: Uterus absent. Along the left ovary/left adnexa, there is heterogeneous density, marginal stranding, and expansion of presumed ovarian tissues, currently measuring 4.9 by 3.4 by 7.8 cm. Small amount of adjacent free fluid tracking into the cul-de-sac. Right ovary unremarkable.  Other: No supplemental non-categorized findings.  Musculoskeletal: Grade 1 degenerative anterolisthesis at L4-5. Lower lumbar spondylosis and degenerative disc disease.  IMPRESSION: 1. Abnormal expansion, heterogeneous density, and marginal stranding with adjacent fluid along the left ovary. Possibilities include hemorrhage, mass, or a vascular abnormality such as torsion. Consider dedicated pelvic sonography. 2. In addition, there is a nonspecific mass of the left adrenal gland measuring up to 2.8 cm in long axis with marginal stranding. Adrenal hemorrhage is not excluded although this mass is nonspecific. Correlate with any risk factors for bleeding. 3. There are at least 4 abnormal hypodense lesions in the spleen. Possibilities include benign lesions such as hemangioma or lymphangioma, and malignancy such as lymphoma. Assuming the patient's GFR does not drop below 30, follow up MRI of the abdomen with and without contrast could be utilized to assess both the spleen and the adrenal gland. 4. Bilateral renal lesions are technically nonspecific although statistically likely to be cysts. 5. Faint subpleural ground-glass opacities in both lungs. Atypical pneumonia is suspected.   Electronically Signed   By: Van Clines M.D.   On: 01/01/2019  18:55    CLINICAL DATA:  69 year old female with history of left-sided abdominal pain.  EXAM: MRI ABDOMEN WITHOUT AND WITH CONTRAST  TECHNIQUE: Multiplanar multisequence MR imaging of the abdomen was performed both before and after the administration of intravenous contrast.  CONTRAST:  15mL GADAVIST GADOBUTROL 1 MMOL/ML IV SOLN  COMPARISON:  No priors.  FINDINGS: Lower chest: Patchy ill-defined areas of increased signal intensity in the visualized lung bases likely reflects sequela of recent COVID-19 infection.  Hepatobiliary: No suspicious cystic or solid hepatic lesions. No intra or extrahepatic biliary ductal dilatation. Gallbladder is normal in appearance.  Pancreas: No pancreatic mass. No pancreatic ductal dilatation. No pancreatic or peripancreatic fluid collections or inflammatory changes.  Spleen: Multiple splenic lesions are noted measuring up to 3 cm in diameter, demonstrating T1 hypointensity, heterogeneous T2 hyperintensity, multiple internal thin septations which demonstrate mild septal enhancement on post gadolinium imaging. Per report from  the technologist, the patient was repeatedly falling asleep, so post gadolinium imaging was limited to only early phases. Within these limitations, these lesions do not appear to demonstrate centripetal filling. These lesions do not restrict diffusion.  Adrenals/Urinary Tract: Multiple T1 hypointense, T2 hyperintense, nonenhancing lesions in both kidneys, compatible with simple cysts, largest of which measures 2.6 cm in the interpolar region of the right kidney. No suspicious renal lesions. No hydroureteronephrosis in the visualized portions of the abdomen. Right adrenal gland is normal in appearance. 2.9 x 1.9 cm left adrenal nodule which demonstrates minimally increased T1 signal intensity, is isointense on T2 weighted images, clearly enhances and demonstrates diffusion restriction.  Stomach/Bowel:  Visualized portions are unremarkable.  Vascular/Lymphatic: Aortic atherosclerosis, without evidence of aneurysm in the abdominal vasculature. No lymphadenopathy noted in the abdomen.  Other: No significant volume of ascites in the visualized portions of the peritoneal cavity.  Musculoskeletal: No aggressive appearing osseous lesions are noted in the visualized portions of the skeleton.  IMPRESSION: 1. No acute findings are noted in the abdomen to account for the patient's symptoms. 2. 2.9 x 1.9 cm left adrenal nodule which has imaging characteristics suspicious for bone metastasis. 3. Patchy ill-defined areas of increased signal intensity in the visualized lung bases which likely reflects sequela of COVID-19 infection. 4. 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 5. Aortic atherosclerosis.   Electronically Signed   By: Vinnie Langton M.D.   On: 01/02/2019 08:26   CT scan MRI was personally reviewed today.  I also called [facility] radiology to clarify assessment and plan on MR which suspect was a typo.  I was unable to reach the initial reader of this study as he is on a vacation.  I did speak with Dr. 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  Assessment & Plan:    1. Adrenal mass ([place]) Incidental left adrenal nodule, no previous cross-sectional imaging for comparison to establish chronicity  She is asymptomatic without clinical evidence of metabolically active adenoma.  She is had a pre-Lyme metabolic evaluation including plasma metanephrines and potassium was also negative.  As per my discussion with Dr. 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  She does  have this personal history of enlargement of her left ovary this plan follow-up with Dr. Theora Gianotti who is helping to evaluate for concern for possible underlying ovarian malignancy.  Depending on her discussion tomorrow, she may or may not undergo an oophorectomy for this.  We will follow closely.  If his impact is concerning for malignancy, would recommend the patient undergo the intended surgery possibly followed by PET scan and/or adrenal biopsy.  If there is no concern for ovarian malignancy or pathology, will likely recommend follow-up with serial imaging in the form of CT abdomen with and without contrast (adrenal protocol) at the 63-month interval to ensure that there is no interval growth.  She is agreeable this plan.  Plan follow-up with the patient in about a month via virtual visit as well as touch base with Dr. Tasia Catchings and Dr. Theora Gianotti. - Urinalysis, Complete  2. Enlarged ovary As above   F/u virtual visit in 1 month  Hollice Espy, MD  [place] [address] [place],  [address] [phone]  I spent 45 min with this patient of which greater than 50% was spent in counseling and coordination of care with the patient.

## 2019-02-12 ENCOUNTER — Other Ambulatory Visit (INDEPENDENT_AMBULATORY_CARE_PROVIDER_SITE_OTHER): Payer: Medicare PPO

## 2019-02-12 ENCOUNTER — Other Ambulatory Visit: Payer: Self-pay | Admitting: Family

## 2019-02-12 ENCOUNTER — Other Ambulatory Visit: Payer: Self-pay

## 2019-02-12 ENCOUNTER — Inpatient Hospital Stay (HOSPITAL_BASED_OUTPATIENT_CLINIC_OR_DEPARTMENT_OTHER): Payer: Medicare PPO | Admitting: Obstetrics and Gynecology

## 2019-02-12 VITALS — BP 124/74 | HR 80 | Temp 96.2°F | Resp 16 | Wt 257.0 lb

## 2019-02-12 DIAGNOSIS — Z6841 Body Mass Index (BMI) 40.0 and over, adult: Secondary | ICD-10-CM | POA: Diagnosis not present

## 2019-02-12 DIAGNOSIS — D398 Neoplasm of uncertain behavior of other specified female genital organs: Secondary | ICD-10-CM

## 2019-02-12 DIAGNOSIS — N189 Chronic kidney disease, unspecified: Secondary | ICD-10-CM

## 2019-02-12 DIAGNOSIS — Z8616 Personal history of COVID-19: Secondary | ICD-10-CM | POA: Diagnosis not present

## 2019-02-12 DIAGNOSIS — E278 Other specified disorders of adrenal gland: Secondary | ICD-10-CM

## 2019-02-12 DIAGNOSIS — D3912 Neoplasm of uncertain behavior of left ovary: Secondary | ICD-10-CM | POA: Diagnosis present

## 2019-02-12 DIAGNOSIS — I10 Essential (primary) hypertension: Secondary | ICD-10-CM

## 2019-02-12 DIAGNOSIS — E782 Mixed hyperlipidemia: Secondary | ICD-10-CM | POA: Diagnosis not present

## 2019-02-12 DIAGNOSIS — Z87891 Personal history of nicotine dependence: Secondary | ICD-10-CM | POA: Diagnosis not present

## 2019-02-12 DIAGNOSIS — Z79899 Other long term (current) drug therapy: Secondary | ICD-10-CM | POA: Diagnosis not present

## 2019-02-12 DIAGNOSIS — I129 Hypertensive chronic kidney disease with stage 1 through stage 4 chronic kidney disease, or unspecified chronic kidney disease: Secondary | ICD-10-CM | POA: Diagnosis not present

## 2019-02-12 DIAGNOSIS — E7800 Pure hypercholesterolemia, unspecified: Secondary | ICD-10-CM | POA: Diagnosis not present

## 2019-02-12 DIAGNOSIS — E559 Vitamin D deficiency, unspecified: Secondary | ICD-10-CM | POA: Diagnosis not present

## 2019-02-12 LAB — COMPREHENSIVE METABOLIC PANEL
ALT: 10 U/L (ref 0–35)
AST: 15 U/L (ref 0–37)
Albumin: 4.1 g/dL (ref 3.5–5.2)
Alkaline Phosphatase: 73 U/L (ref 39–117)
BUN: 15 mg/dL (ref 6–23)
CO2: 27 mEq/L (ref 19–32)
Calcium: 10 mg/dL (ref 8.4–10.5)
Chloride: 108 mEq/L (ref 96–112)
Creatinine, Ser: 1.4 mg/dL — ABNORMAL HIGH (ref 0.40–1.20)
GFR: 45.33 mL/min — ABNORMAL LOW (ref >60.00)
Glucose, Bld: 110 mg/dL — ABNORMAL HIGH (ref 70–99)
Potassium: 4 mEq/L (ref 3.5–5.1)
Sodium: 140 mEq/L (ref 135–145)
Total Bilirubin: 0.7 mg/dL (ref 0.2–1.2)
Total Protein: 7.2 g/dL (ref 6.0–8.3)

## 2019-02-12 MED ORDER — DIAZEPAM 5 MG PO TABS: ORAL_TABLET | ORAL | 0 refills | Status: DC

## 2019-02-12 NOTE — Addendum Note (Signed)
Addended by: Verlon Au on: 02/12/2019 04:09 PM   Modules accepted: Orders

## 2019-02-12 NOTE — Progress Notes (Addendum)
Gynecologic Oncology Consult Visit   Referring Provider: Benjaman Kindler, MD  Chief Complaint: left adnexal mass.   Subjective:  Jennifer Huff is a 69 y.o. G3P3 female s/p VH (menorrhagia ~2000, ovaries in situ per patient) who is seen in consultation from Dr. Leafy Ro for left adnexal mass.   02/10/19 repeat US Measurements: Questionably visualized on transvaginal imaging, 4.7 x 3.1 x 4.0 cm = volume: 31 mL. Single small echogenic focus identified question calcification as noted on prior CT. No definite mass.  Continues to be asymptomatic.  History She presented initially to ER on 01/01/2019 with left-sided pelvic pain and flank pain. She has also had a recent COVID-19 infection, dx on 12/21/18.  CT Renal Stone Study 01/01/2019 showed left ovary/left adnexa, heterogeneous density, marginal stranding, and expansion of presumed ovarian tissues, currently measuring 4.9 x 3.4 x 7.8 cm, small amount of adjacent free fluid tracking into cul-de-sac. Right ovary unremarkable. Additionally, incidental left adrenal mass measuring up to 2.8 cm in long axis with marginal stranding noted. 4 abnormal hypodense lesions in the spleen, bilateral renal lesions nonspecific, faint subpleural ground glass opacities suspicious for atypical pneumonia.   MRI Abdomen 01/01/2019  1. No acute findings are noted in the abdomen to account for the patient's symptoms. 2. 2.9 x 1.9 cm left adrenal nodule which has imaging characteristics suspicious for bone metastasis. 3. Patchy ill-defined areas of increased signal intensity in the visualized lung bases which likely reflects sequela of COVID-19 infection. 4. 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 5. Aortic atherosclerosis.  Pelvic Ultrasound 12/30: Uterus surgically absent  -Ovaries best assessed transabdominally given high positioning in the pelvis. RO: 2.6 x 0.8 x 1.7 cm = 1.9 mL; normal  appearance/no adnexal mass, normal blood flow - LO: 7.3 x 3.5 x 3.8 cm = 51.7 mL; Ovarian parenchyma is diffusely heterogenous with focal calcification, as seen on CT. Difficult to delineate focal mass from heterogenous parenchyma. Blood flow is noted with normal arterial and venous flow.  - Small volume of free fluid in the pelvis - Radiologist noted: Ovarian neoplasm is considered, though a discrete cystic or solid mass is difficult to delineate from diffuse heterogeneity. Recommend gyn consultation.  Tumor Markers:  CA 125 - 14.1 HE4- <15   She has a long history of constipation and no change in her bowel habits.    Problem List: Patient Active Problem List   Diagnosis Date Noted  . Enlarged ovary 01/08/2019  . Adrenal mass ([place]) 01/08/2019  . CKD (chronic kidney disease) 01/08/2019  . Decreased appetite 12/30/2018  . COVID-19 virus detected 12/30/2018  . Status post arthroscopic surgery of right knee 11/20/2017  . Complex tear of lateral meniscus of right knee as current injury 11/09/2017  . Primary osteoarthritis of right knee 11/09/2017  . BMI 40.0-44.9, adult ([place]) 11/04/2017  . Chest pain with moderate risk for cardiac etiology 04/26/2017  . Borderline diabetes 04/26/2017  . Mixed hyperlipidemia 04/26/2017  . Former smoker 04/25/2017  . Anxiety and depression 04/11/2017  . Dry skin 06/29/2015  . Postmenopausal estrogen deficiency 12/23/2014  . Hemorrhoid 12/23/2014  . Screening for breast cancer 06/23/2014  . Allergic rhinitis 06/23/2014  . Routine general medical examination at a health care facility 12/16/2012  . Hypertension 12/08/2010  . Morbid obesity ([place]) 12/08/2010    Past Medical History: Past Medical History:  Diagnosis Date  . Cataract   . HTN (hypertension)   . Hypercholesteremia   . Vitamin D deficiency  Past Surgical History: Past Surgical History:  Procedure Laterality Date  . KNEE ARTHROSCOPY WITH LATERAL MENISECTOMY Right 11/06/2017    Procedure: KNEE ARTHROSCOPY WITH REPAIR LATERAL MENISCAL ROOT TEAR;  Surgeon: Corky Mull, MD;  Location: [facility] ORS;  Service: Orthopedics;  Laterality: Right;  . LAPAROSCOPIC HYSTERECTOMY      Past Gynecologic History:  Menstrual details: h/o menorrhagia Last Menstrual Period: 20 years ago Mammogram 08/2018 normal per patient   OB History:  OB History  Gravida Para Term Preterm AB Living  3 3          SAB TAB Ectopic Multiple Live Births               # Outcome Date GA Lbr Len/2nd Weight Sex Delivery Anes PTL Lv  3 Para           2 Para           1 Para             Obstetric Comments  NSVD x 3 no complications; h/o cerclages for last two pregnancies    Family History: Family History  Problem Relation Age of Onset  . Kidney disease Father   . Heart disease Father        died from mi  . Cancer Father        prostate  . Hypertension Sister   . Colon polyps Sister   . Hypertension Brother   . Hypertension Sister   . Diabetes Mother   . Hypertension Mother   . Cancer Paternal Grandmother        head and neck  . Hypertension Brother   . Cancer Son   . Colon cancer Neg Hx   . Breast cancer Neg Hx     Social History: Social History   Socioeconomic History  . Marital status: Married    Spouse name: Not on file  . Number of children: 3  . Years of education: Not on file  . Highest education level: Not on file  Occupational History  . 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 Smoking status: Former Smoker    Quit date: 12/07/1997    Years since quitting: 21.1  . Smokeless tobacco: Never Used  Substance and Sexual Activity  . Alcohol use: Not Currently  . Drug use: No  . Sexual activity: Not on file  Other Topics Concern  . Not on file  Social History Narrative   Regular Exercise -  NO   Daily Caffeine Use:  2-3 soda/sw tea      Retired   Scientist, physiological Strain: Low Risk   . Difficulty of Paying  Living Expenses: Not hard at all  Food Insecurity: No Food Insecurity  . Worried About Charity fundraiser in the Last Year: Never true  . Ran Out of Food in the Last Year: Never true  Transportation Needs: No Transportation Needs  . Lack of Transportation (Medical): No  . Lack of Transportation (Non-Medical): No  Physical Activity: Unknown  . Days of Exercise per Week: 0 days  . Minutes of Exercise per Session: Not on file  Stress: No Stress Concern Present  . Feeling of Stress : Not at all  Social Connections:   . Frequency of Communication with Friends and Family: Not on file  . Frequency of Social Gatherings with Friends and Family: Not on file  . Attends Religious Services: Not on file  .  Active Member of Clubs or Organizations: Not on file  . Attends Archivist Meetings: Not on file  . Marital Status: Not on file  Intimate Partner Violence: Not At Risk  . Fear of Current or Ex-Partner: No  . Emotionally Abused: No  . Physically Abused: No  . Sexually Abused: No    Allergies: Allergies  Allergen Reactions  . Ace Inhibitors Swelling    Lips swelled.  . Codeine Other (See Comments)    Jittery     Current Medications: Current Outpatient Medications  Medication Sig Dispense Refill  . acetaminophen (TYLENOL) 500 MG tablet Take 1,000 mg by mouth daily as needed for mild pain.    Marland Kitchen amLODipine (NORVASC) 10 MG tablet Take 1 tablet (10 mg total) by mouth daily. 90 tablet 4  . calcium carbonate (TUMS - DOSED IN MG ELEMENTAL CALCIUM) 500 MG chewable tablet Chew 2 tablets by mouth daily as needed for indigestion or heartburn.    . Cholecalciferol (VITAMIN D3) 2000 units TABS Take 2,000 Units by mouth at bedtime.     Marland Kitchen KETOROLAC TROMETHAMINE OP Place 1 drop into the left eye 2 (two) times daily as needed (eye pain).    Marland Kitchen losartan (COZAAR) 100 MG tablet Take 1 tablet (100 mg total) by mouth daily. 90 tablet 4  . ondansetron (ZOFRAN ODT) 4 MG disintegrating tablet Take 1  tablet (4 mg total) by mouth every 8 (eight) hours as needed for nausea or vomiting. 30 tablet 0  . pravastatin (PRAVACHOL) 40 MG tablet Take 1 tablet (40 mg total) by mouth daily. 90 tablet 4   No current facility-administered medications for this visit.   Review of Systems  General: negative for fevers, changes in weight or night sweats Skin: negative for changes in moles or sores or rash Eyes: negative for changes in vision HEENT: negative for change in hearing, tinnitus, voice changes Pulmonary: negative for dyspnea, orthopnea, productive cough, wheezing Cardiac: negative for palpitations, pain Gastrointestinal: constipation o/w negative for nausea, vomiting, diarrhea, hematemesis, hematochezia Genitourinary/Sexual: negative for dysuria, retention, hematuria, incontinence Ob/Gyn:  negative for abnormal bleeding, or pain Musculoskeletal: negative for pain, joint pain, back pain Hematology: negative for easy bruising, abnormal bleeding Neurologic/Psych: negative for headaches, seizures, paralysis, weakness, numbness   Objective:  Physical Examination:  BP 124/74   Pulse 80   Temp (!) 96.2 F (35.7 C) (Tympanic)   Resp 16   Wt 257 lb (116.6 kg)   BMI 42.77 kg/m     ECOG Performance Status: 1 - Symptomatic but completely ambulatory  GENERAL: Patient is a well appearing female in no acute distress HEENT:  PERRL, neck supple with midline trachea. Thyroid without masses.  NODES:  No cervical, supraclavicular, axillary, or inguinal lymphadenopathy palpated.  LUNGS:  Clear to auscultation bilaterally.  No wheezes or rhonchi. HEART:  Regular rate and rhythm. No murmur appreciated. ABDOMEN:  Soft, nontender, obese.  No ascites or pelvic masses; exam limited by habitus. EXTREMITIES:  No peripheral edema.   SKIN:  Clear with no obvious rashes or skin changes. No nail dyscrasia. NEURO:  Nonfocal. Well oriented.  Appropriate affect.  Pelvic: 02/05/19 EGBUS: no lesions Cervix:  surgically absent Vagina: no lesions, no discharge or bleeding Uterus: surgically absent Adnexa: no palpable masses but very limited exam Rectovaginal: deferred   Lab Review No labs on site today  Radiologic Imaging: Imaging ordered    Assessment:  Jennifer Huff is a 69 y.o. female diagnosed 01/01/19 with 7.3 cm ovary/pelvic mass at  time of episode of abdominal pain evaluated in the ED.  Tumor markers normal. On ultrasound mass is diffusely heterogeneous with focal calcification, which may suggest a dermoid.  She has not had further pain, but surgery was planned for next week in view of size of mass and complexity.  Now [place] 02/10/19 shows left ovary only 4.3 cm and poorly visualized.  Spoke to radiologist who thinks 01/01/19 [place] that showed left ovarian mass may have been due to presence of peritoneal fluid rather than an ovarian mass. Suggests MRI for better characterization.    MRI 01/01/19 showed 2.9 x 1.9 cm left adrenal nodule, and saw Dr Erlene Quan in Urology who thinks it is benign (lipid poor adenoma) and plans surveillance.  Medical co-morbidities complicating care: HTN, prior surgery, There is no height or weight on file to calculate BMI. Plan:   Problem List Items Addressed This Visit      Other   Adrenal mass ([place]) - Primary     We discussed [place] results with the patient and plan to get pelvic MRI to better assess left ovary and determine whether surgery is advisable.  She remains asymptomatic.  Will discuss results with patient when available and plan accordingly for surgery if there is a significant mass or expectant management if not.    Patient reports claustrophobia with MRI. Will send prescription for Valium, 1 pill 30-60 minutes prior to MRI, second tablet 15 minutes prior to MRI if needed. Driving and  precautions provided.   She will follow up with Dr Erlene Quan regarding adrenal nodule.    The patient's diagnosis, an outline of the further diagnostic and laboratory  studies which will be required, the recommendation for surgery, and alternatives were discussed with her and her accompanying family members.  All questions were answered to their satisfaction.  A total of 15 minutes was spent in care of the patient today.   Mellody Drown, MD   CC:  Benjaman Kindler, MD

## 2019-02-12 NOTE — Progress Notes (Signed)
Pt here for check up from last week, no gyn concerns today

## 2019-02-24 ENCOUNTER — Ambulatory Visit
Admission: RE | Admit: 2019-02-24 | Discharge: 2019-02-24 | Disposition: A | Discharge status: Home / Self Care | Payer: Medicare PPO | Source: Ambulatory Visit | Attending: Nurse Practitioner | Admitting: Nurse Practitioner

## 2019-02-24 ENCOUNTER — Other Ambulatory Visit: Payer: Self-pay

## 2019-02-24 DIAGNOSIS — E278 Other specified disorders of adrenal gland: Secondary | ICD-10-CM | POA: Diagnosis present

## 2019-02-24 MED ORDER — GADOBUTROL 1 MMOL/ML IV SOLN
10.0000 mL | Freq: Once | INTRAVENOUS | Status: AC | PRN
  Administered 2019-02-24: 09:00:00 10 mL via INTRAVENOUS

## 2019-02-26 ENCOUNTER — Telehealth: Payer: Self-pay | Admitting: Nurse Practitioner

## 2019-02-26 NOTE — Telephone Encounter (Signed)
Called patient to review u/s results. No answer. Left message to return call.

## 2019-02-27 ENCOUNTER — Telehealth: Payer: Self-pay | Admitting: Nurse Practitioner

## 2019-02-27 NOTE — Telephone Encounter (Signed)
Imaging reviewed by Dr. Fransisca Connors. Mass has resolved. Suspect it was fluid. Discussed results of MRI with patient as well as Dr. Blake Divine recommendation to follow up with her medical doctor as planned. If she becomes symptomatic in the future would consider re-imaging but no follow up needed at this time.

## 2019-03-10 ENCOUNTER — Other Ambulatory Visit: Payer: Self-pay | Admitting: Family

## 2019-03-10 DIAGNOSIS — I10 Essential (primary) hypertension: Secondary | ICD-10-CM

## 2019-03-10 NOTE — Progress Notes (Signed)
Virtual Visit via Telephone Note  I connected with Jennifer Huff on 03/11/2019 at 10:00 AM EST by telephone and verified that I am speaking with the correct person using two identifiers.  Location: Patient: Home  Provider: Office   I discussed the limitations, risks, security and privacy concerns of performing an evaluation and management service by telephone and the availability of in person appointments. I also discussed with the patient that there may be a patient responsible charge related to this service. The patient expressed understanding and agreed to proceed.  History of Present Illness: Jennifer Huff is a 69 y.o. African American F who returns today for f/u of inidental adrenal mass.   Notably, she was seen in the emergency room in late December with left back/flank pain.  Her CT scan from 01/01/19 was negative for any significant renal pathology or stones however an incidental 2.8 com left adrenal nodule was appreciated as well as concern for standing associated with left ovary.   CT scan was followed up with an MRI of the abdomen with and without contrast on 01/02/2019.  This showed a 2.9 x 1.9 cm left adrenal nodule as well as sequela of her recent COVID-19 infection.  Several simple renal cysts are also identified.  She underwent an abbreviated metabolic evaluation for possible metabolically active adrenal adenoma including normal metanephrines, potassium, etc.  She was followed up with a MRI of pelvis on 02/24/19 which shows no concern for ovarian cancer and with normal tumor markers.  No concern for ovarian cancer based in Dr. Fransisca Connors last note.  She is asymptomatic.  No further episodes of flank pain.  No urinary issues.  No dysuria or gross hematuria.  No incidence of flushing, headaches, severe or poorly controlled hypertension or potassium issues.  Observations/Objective: Pt is engaged and asking good questions.   Pertinent Imagings:  CLINICAL DATA:   Adnexal mass  EXAM: TRANSABDOMINAL AND TRANSVAGINAL ULTRASOUND OF PELVIS  TECHNIQUE: Both transabdominal and transvaginal ultrasound examinations of the pelvis were performed. Transabdominal technique was performed for global imaging of the pelvis including uterus, ovaries, adnexal regions, and pelvic cul-de-sac. It was necessary to proceed with endovaginal exam following the transabdominal exam to visualize the adnexa and ovaries.  COMPARISON:  01/01/2019  FINDINGS: Uterus  Surgically absent  Endometrium  N/A  Right ovary  Measurements: 2.9 x 1.2 x 1.2 cm = volume: 2 mL. Normal morphology without mass  Left ovary  Measurements: Questionably visualized on transvaginal imaging, 4.7 x 3.1 x 4.0 cm = volume: 31 mL. Single small echogenic focus identified question calcification as noted on prior CT. No definite mass.  Other findings  No free pelvic fluid or adnexal masses.  IMPRESSION: Surgical absence of the uterus with normal appearing RIGHT ovary.  Questionable visualization of the LEFT ovary versus artifact from soft tissue and bowel in the pelvis; if better visualization of the ovary is required consider MR.  No definite pelvic sonographic abnormalities identified.   Electronically Signed   By: Lavonia Dana M.D.   On: 02/10/2019 17:02  ADDENDUM REPORT: 02/17/2019 08:13  ADDENDUM: There is a voice recognition error in the original dictation. Specifically, impression #2 should read: 2.9 x 1.9 cm left adrenal nodule which has imaging characteristics suspicious for metastasis.   Electronically Signed   By: Vinnie Langton M.D.   On: 02/17/2019 08:13   CLINICAL DATA:  Indeterminate left adnexal mass on prior CT and ultrasound. No current symptoms reported. Prior hysterectomy.  EXAM: MRI PELVIS WITHOUT  AND WITH CONTRAST  TECHNIQUE: Multiplanar multisequence MR imaging of the pelvis was performed both before and after  administration of intravenous contrast.  CONTRAST:  89mL GADAVIST GADOBUTROL 1 MMOL/ML IV SOLN  COMPARISON:  02/10/2019 pelvic sonogram. 01/01/2019 unenhanced CT abdomen/pelvis.  FINDINGS: Urinary Tract:  Normal bladder.  Normal urethra.  Bowel: Visualized small and large bowel are normal caliber with no bowel wall thickening. Mild sigmoid diverticulosis.  Vascular/Lymphatic: No pathologically enlarged lymph nodes in the pelvis. No acute vascular abnormality in the pelvis.  Reproductive:  Status post hysterectomy. No mass or fluid collection at the vaginal cuff.  Ovaries and Adnexa: The right ovary measures 2.1 x 1.4 x 1.6 cm and is normal. The left ovary measures 1.9 x 1.2 x 1.4 cm and is normal. There are no ovarian or adnexal masses. The asymmetric left ovarian enlargement seen on 01/01/2019 unenhanced CT abdomen/pelvis study has resolved.  Other: No free fluid in the pelvis. No focal pelvic fluid collection. Incidentally noted small simple appearing renal cysts in the lower kidneys.  Musculoskeletal: No aggressive appearing focal osseous lesions.  IMPRESSION: 1. Symmetric normal postmenopausal ovaries. No ovarian or adnexal masses. The asymmetric left ovarian enlargement seen on 01/01/2019 unenhanced CT abdomen/pelvis study has resolved. 2. Status post hysterectomy, with no abnormality seen at the vaginal cuff. 3. Mild sigmoid diverticulosis.   Electronically Signed   By: Ilona Sorrel M.D.   On: 02/24/2019 10:23  I have personally reviewed the images and agree with radiologist interpretation.   Assessment and Plan:  1. 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 The patient was provided an opportunity to ask questions and all were answered. The patient agreed with the plan and demonstrated an understanding of the instructions.   The patient was advised to call back or seek an in-person evaluation if the symptoms worsen or if the condition fails to improve as anticipated.  I provided 12 minutes of non-face-to-face time during this encounter.   Dolores Frame, am acting as a scribe for Dr. Hollice Espy,  I have reviewed the above documentation for accuracy and completeness, and I agree with the above.   Hollice Espy, MD

## 2019-03-11 ENCOUNTER — Telehealth (INDEPENDENT_AMBULATORY_CARE_PROVIDER_SITE_OTHER): Payer: Medicare PPO | Admitting: Urology

## 2019-03-11 ENCOUNTER — Other Ambulatory Visit: Payer: Self-pay

## 2019-03-11 DIAGNOSIS — E278 Other specified disorders of adrenal gland: Secondary | ICD-10-CM

## 2019-03-12 ENCOUNTER — Encounter: Payer: Self-pay | Admitting: Family

## 2019-03-12 ENCOUNTER — Other Ambulatory Visit: Payer: Self-pay

## 2019-03-12 ENCOUNTER — Ambulatory Visit (INDEPENDENT_AMBULATORY_CARE_PROVIDER_SITE_OTHER): Payer: Medicare PPO | Admitting: Family

## 2019-03-12 VITALS — BP 128/76 | HR 68 | Temp 97.0°F | Ht 66.0 in | Wt 258.6 lb

## 2019-03-12 DIAGNOSIS — E278 Other specified disorders of adrenal gland: Secondary | ICD-10-CM

## 2019-03-12 DIAGNOSIS — I10 Essential (primary) hypertension: Secondary | ICD-10-CM | POA: Diagnosis not present

## 2019-03-12 DIAGNOSIS — N189 Chronic kidney disease, unspecified: Secondary | ICD-10-CM | POA: Diagnosis not present

## 2019-03-12 DIAGNOSIS — Z1211 Encounter for screening for malignant neoplasm of colon: Secondary | ICD-10-CM

## 2019-03-12 LAB — BASIC METABOLIC PANEL
BUN: 18 mg/dL (ref 6–23)
CO2: 28 mEq/L (ref 19–32)
Calcium: 10 mg/dL (ref 8.4–10.5)
Chloride: 109 mEq/L (ref 96–112)
Creatinine, Ser: 1.3 mg/dL — ABNORMAL HIGH (ref 0.40–1.20)
GFR: 49.36 mL/min — ABNORMAL LOW (ref >60.00)
Glucose, Bld: 120 mg/dL — ABNORMAL HIGH (ref 70–99)
Potassium: 4.1 mEq/L (ref 3.5–5.1)
Sodium: 140 mEq/L (ref 135–145)

## 2019-03-12 NOTE — Progress Notes (Signed)
Subjective:    Patient ID: Mirha Varone, female    DOB: 27-Aug-1950, 69 y.o.   MRN: [id number]  CC: Jennifer Huff is a 69 y.o. female who presents today for follow up.   HPI: HTN- compliant with medication. No , sob   CKD- decreased at last visit. post covid diagnosed 12/21/18, suspected contrast with MRI. No nsaid use.  Due colonoscopy.   occasional epigastric burning. No trouble or pain with swallowing. Takes prn tums, nexium. Controls symptoms primarily with food choices.    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 Had virtual appointment with Dr. Erlene Quan, urology yesterday MRI in one year for adrenal mass 12/2018: MR abdomen showed 2.9 x 1.9 cm left adrenal nodule.  Dr Fransisca Connors, oncology MR Pelvis 02/24/19- asymmetric left ovarian mass from CT 01/01/2019 resolved.  HISTORY:  Past Medical History:  Diagnosis Date  . Cataract   . HTN (hypertension)   . Hypercholesteremia   . Vitamin D deficiency    Past Surgical History:  Procedure Laterality Date  . KNEE ARTHROSCOPY WITH LATERAL MENISECTOMY Right 11/06/2017   Procedure: KNEE ARTHROSCOPY WITH REPAIR LATERAL MENISCAL ROOT TEAR;  Surgeon: Corky Mull, MD;  Location: [facility] ORS;  Service: Orthopedics;  Laterality: Right;  . LAPAROSCOPIC HYSTERECTOMY     Family History  Problem Relation Age of Onset  . Kidney disease Father   . Heart disease Father        died from mi  . Cancer Father        prostate  . Hypertension Sister   . Colon polyps Sister   . Hypertension Brother   . Hypertension Sister   . Diabetes Mother   . Hypertension Mother   . Cancer Paternal Grandmother        head and neck  . Hypertension Brother   . Cancer Son   . Colon cancer Neg Hx   . Breast cancer Neg Hx     Allergies: Ace inhibitors and Codeine Current Outpatient Medications on File Prior to Visit  Medication Sig Dispense Refill  . acetaminophen (TYLENOL) 500 MG tablet Take 1,000 mg by mouth daily as needed for mild pain.     Marland Kitchen amLODipine (NORVASC) 10 MG tablet Take 1 tablet (10 mg total) by mouth daily. 90 tablet 4  . calcium carbonate (TUMS - DOSED IN MG ELEMENTAL CALCIUM) 500 MG chewable tablet Chew 2 tablets by mouth daily as needed for indigestion or heartburn.    . Cholecalciferol (VITAMIN D3) 2000 units TABS Take 2,000 Units by mouth at bedtime.     Marland Kitchen KETOROLAC TROMETHAMINE OP Place 1 drop into the left eye 2 (two) times daily as needed (eye pain).    Marland Kitchen losartan (COZAAR) 100 MG tablet Take 1 tablet (100 mg total) by mouth daily. 90 tablet 0  . pravastatin (PRAVACHOL) 40 MG tablet Take 1 tablet (40 mg total) by mouth daily. 90 tablet 4   No current facility-administered medications on file prior to visit.    Social History   Tobacco Use  . Smoking status: Former Smoker    Quit date: 12/07/1997    Years since quitting: 21.2  . Smokeless tobacco: Never Used  Substance Use Topics  . Alcohol use: Not Currently  . Drug use: No    Review of Systems  Constitutional: Negative for chills and fever.  HENT: Negative for trouble swallowing.   Respiratory: Negative for cough.   Cardiovascular: Negative for chest pain and palpitations.  Gastrointestinal: Negative for nausea and vomiting.  Objective:    BP 128/76   Pulse 68   Temp (!) 97 F (36.1 C) (Temporal)   Ht 5\' 6"  (1.676 m)   Wt 258 lb 9.6 oz (117.3 kg)   SpO2 99%   BMI 41.74 kg/m  BP Readings from Last 3 Encounters:  03/12/19 128/76  02/12/19 124/74  02/11/19 (!) 148/82   Wt Readings from Last 3 Encounters:  03/12/19 258 lb 9.6 oz (117.3 kg)  02/12/19 257 lb (116.6 kg)  02/11/19 256 lb (116.1 kg)    Physical Exam Vitals reviewed.  Constitutional:      Appearance: She is well-developed.  Eyes:     Conjunctiva/sclera: Conjunctivae normal.  Cardiovascular:     Rate and Rhythm: Normal rate and regular rhythm.     Pulses: Normal pulses.     Heart sounds: Normal heart sounds.  Pulmonary:     Effort: Pulmonary effort is normal.       Breath sounds: Normal breath sounds. No wheezing, rhonchi or rales.  Skin:    General: Skin is warm and dry.  Neurological:     Mental Status: She is alert.  Psychiatric:        Speech: Speech normal.        Behavior: Behavior normal.        Thought Content: Thought content normal.        Assessment & Plan:   Problem List Items Addressed This Visit      Cardiovascular and Mediastinum   Hypertension - Primary    Controlled, continue regimen        Genitourinary   CKD (chronic kidney disease)    Lab Results  Component Value Date   CREATININE 1.40 (H) 02/12/2019   CREATININE 1.49 (H) 01/01/2019   CREATININE 1.22 (H) 01/25/2018   Worsening.  ? Is this decreases related to recent Covid infection, IV contrast.  Referral is in place to nephrology.  Following up today in regards to this referral with coordinator.  Pending repeat BMP      Relevant Orders   Basic metabolic panel     Other   Adrenal mass (HCC)    Stable, has had a consult with Dr. Erlene Quan, urology.  Plan to repeat MRI abdomen in 1 year.       Other Visit Diagnoses    Screen for colon cancer       Relevant Orders   Ambulatory referral to Gastroenterology       I have discontinued [name]. Archibald's diazepam. [name] her Vitamin D3, KETOROLAC TROMETHAMINE OP, acetaminophen, calcium carbonate, pravastatin, amLODipine, and losartan.   No orders of the defined types were placed in this encounter.   Return precautions given.   Risks, benefits, and alternatives of the medications and treatment plan prescribed today were discussed, and patient expressed understanding.   Education regarding symptom management and diagnosis given to patient on AVS.  Continue to follow with Burnard Hawthorne, FNP for routine health maintenance.   Loetta Rough and I agreed with plan.   Mable Paris, FNP

## 2019-03-12 NOTE — Assessment & Plan Note (Signed)
Lab Results  Component Value Date   CREATININE 1.40 (H) 02/12/2019   CREATININE 1.49 (H) 01/01/2019   CREATININE 1.22 (H) 01/25/2018   Worsening.  ? Is this decreases related to recent Covid infection, IV contrast.  Referral is in place to nephrology.  Following up today in regards to this referral with coordinator.  Pending repeat BMP

## 2019-03-12 NOTE — Patient Instructions (Signed)
Please ensure that you hold [place] accountable and  Get a phone call in regards to gastroenterology AND nephrology appointment. Always a pleasure to see you. Stay safe!

## 2019-03-12 NOTE — Assessment & Plan Note (Signed)
Controlled, continue regimen.

## 2019-03-12 NOTE — Assessment & Plan Note (Signed)
Stable, has had a consult with Dr. Erlene Quan, urology.  Plan to repeat MRI abdomen in 1 year.

## 2019-03-17 ENCOUNTER — Encounter: Payer: Self-pay | Admitting: *Deleted

## 2019-03-19 ENCOUNTER — Encounter: Payer: Self-pay | Admitting: Gastroenterology

## 2019-03-20 ENCOUNTER — Other Ambulatory Visit: Payer: Self-pay

## 2019-03-20 ENCOUNTER — Encounter: Payer: Self-pay | Admitting: Gastroenterology

## 2019-03-20 ENCOUNTER — Ambulatory Visit (INDEPENDENT_AMBULATORY_CARE_PROVIDER_SITE_OTHER): Payer: Medicare PPO | Admitting: Gastroenterology

## 2019-03-20 DIAGNOSIS — Z86010 Personal history of colonic polyps: Secondary | ICD-10-CM

## 2019-03-20 MED ORDER — NA SULFATE-K SULFATE-MG SULF 17.5-3.13-1.6 GM/177ML PO SOLN: 354.0000 mL | Freq: Once | ORAL | 0 refills | Status: AC

## 2019-03-20 NOTE — Progress Notes (Signed)
Jennifer Huff [address]  [place]  [place], [place] [address]  Main: [phone]  Fax: [phone]   Gastroenterology Consultation  Referring Provider:     Burnard Hawthorne, FNP Primary Care Physician:  Burnard Hawthorne, FNP Reason for Consultation:    History of polyps, GERD        HPI:   Virtual Visit via Telephone Note  I connected with patient on 03/20/19 at 10:00 AM EDT by telephone and verified that I am speaking with the correct person using two identifiers.   I discussed the limitations, risks, security and privacy concerns of performing an evaluation and management service by telephone and the availability of in person appointments. I also discussed with the patient that there may be a patient responsible charge related to this service. The patient expressed understanding and agreed to proceed.  Location of the patient: Home Location of provider: Home Participating persons: Patient and provider only   History of Present Illness: Chief Complaint  Patient presents with  . New Patient (Initial Visit)  . Gastroesophageal Reflux    Patient is having acid reflex, burning in throat. Patient states it is mostly when she eats spicy foods   . Colonoscopy    Patient is due for screening colonoscopy      Jennifer Huff is a 69 y.o. y/o female referred for consultation & management  by Dr. Vidal Schwalbe, Yvetta Coder, FNP.  Patient referred to [place] to discuss repeat colonoscopy and "GERD".  Patient reports very infrequent symptoms of acid reflux.  States it only occurs if she eats spicy foods and does not occur otherwise.  Only happens maybe once or twice a month.  No dysphagia.  No weight loss.  No abdominal pain.  States was taking Nexium in the past but only as needed and has not had to take it on a frequent basis.  Has not taken it in years.  Has Tums that she uses as needed but only takes it very infrequently maybe once a week if that.  No prior EGD.  No  family history of gastric malignancy.  Has had previous colonoscopies with the last one being in 2016 with one small, less than 1 cm tubular adenoma seen and removed.  2011 colonoscopy with one tubular adenoma as well.  Repeat was recommended in 3 to 5 years  Past Medical History:  Diagnosis Date  . Cataract   . HTN (hypertension)   . Hypercholesteremia   . Vitamin D deficiency     Past Surgical History:  Procedure Laterality Date  . KNEE ARTHROSCOPY WITH LATERAL MENISECTOMY Right 11/06/2017   Procedure: KNEE ARTHROSCOPY WITH REPAIR LATERAL MENISCAL ROOT TEAR;  Surgeon: Corky Mull, MD;  Location: [facility] ORS;  Service: Orthopedics;  Laterality: Right;  . LAPAROSCOPIC HYSTERECTOMY      Prior to Admission medications   Medication Sig Start Date End Date Taking? Authorizing Provider  acetaminophen (TYLENOL) 500 MG tablet Take 1,000 mg by mouth daily as needed for mild pain.   Yes [provider]  amLODipine (NORVASC) 10 MG tablet Take 1 tablet (10 mg total) by mouth daily. 07/29/18  Yes Arnett, Yvetta Coder, FNP  calcium carbonate (TUMS - DOSED IN MG ELEMENTAL CALCIUM) 500 MG chewable tablet Chew 2 tablets by mouth daily as needed for indigestion or heartburn.   Yes [provider]  Cholecalciferol (VITAMIN D3) 2000 units TABS Take 2,000 Units by mouth at bedtime.    Yes [provider]  KETOROLAC TROMETHAMINE OP Place 1 drop into the left eye 2 (two) times daily as needed (eye pain).   Yes [provider]  losartan (COZAAR) 100 MG tablet Take 1 tablet (100 mg total) by mouth daily. 03/10/19 03/09/20 Yes Arnett, Yvetta Coder, FNP  pravastatin (PRAVACHOL) 40 MG tablet Take 1 tablet (40 mg total) by mouth daily. 07/29/18  Yes Arnett, Yvetta Coder, FNP  Na Sulfate-K Sulfate-Mg Sulf 17.5-3.13-1.6 GM/177ML SOLN Take 354 mLs by mouth once for 1 dose. 03/20/19 03/20/19  Virgel Manifold, MD    Family History  Problem Relation Age of Onset  . Kidney disease Father   .  Heart disease Father        died from mi  . Cancer Father        prostate  . Hypertension Sister   . Colon polyps Sister   . Hypertension Brother   . Hypertension Sister   . Diabetes Mother   . Hypertension Mother   . Cancer Paternal Grandmother        head and neck  . Hypertension Brother   . Cancer Son   . Colon cancer Neg Hx   . Breast cancer Neg Hx      Social History   Tobacco Use  . Smoking status: Former Smoker    Quit date: 12/07/1997    Years since quitting: 21.2  . Smokeless tobacco: Never Used  Substance Use Topics  . Alcohol use: Not Currently  . Drug use: No    Allergies as of 03/20/2019 - Review Complete 03/20/2019  Allergen Reaction Noted  . Ace inhibitors Swelling 12/08/2010  . Codeine Other (See Comments) 12/08/2010    Review of Systems:    All systems reviewed and negative except where noted in HPI.   Observations/Objective:  Labs: CBC    Component Value Date/Time   WBC 6.5 01/01/2019 1501   RBC 4.65 01/01/2019 1501   HGB 12.3 01/01/2019 1501   HCT 37.1 01/01/2019 1501   PLT 242 01/01/2019 1501   MCV 79.8 (L) 01/01/2019 1501   MCH 26.5 01/01/2019 1501   MCHC 33.2 01/01/2019 1501   RDW 13.9 01/01/2019 1501   LYMPHSABS 1.9 12/29/2015 0927   MONOABS 0.9 12/29/2015 0927   EOSABS 0.1 12/29/2015 0927   BASOSABS 0.0 12/29/2015 0927   CMP     Component Value Date/Time   NA 140 03/12/2019 1129   K 4.1 03/12/2019 1129   CL 109 03/12/2019 1129   CO2 28 03/12/2019 1129   GLUCOSE 120 (H) 03/12/2019 1129   BUN 18 03/12/2019 1129   BUN 13 05/25/2010 0000   CREATININE 1.30 (H) 03/12/2019 1129   CALCIUM 10.0 03/12/2019 1129   PROT 7.2 02/12/2019 1044   ALBUMIN 4.1 02/12/2019 1044   AST 15 02/12/2019 1044   ALT 10 02/12/2019 1044   ALKPHOS 73 02/12/2019 1044   BILITOT 0.7 02/12/2019 1044   GFRNONAA 36 (L) 01/01/2019 1501   GFRAA 42 (L) 01/01/2019 1501    Imaging Studies: MR PELVIS W WO CONTRAST  Result Date: 02/24/2019 CLINICAL DATA:   Indeterminate left adnexal mass on prior CT and ultrasound. No current symptoms reported. Prior hysterectomy. EXAM: MRI PELVIS WITHOUT AND WITH CONTRAST TECHNIQUE: Multiplanar multisequence MR imaging of the pelvis was performed both before and after administration of intravenous contrast. CONTRAST:  22mL GADAVIST GADOBUTROL 1 MMOL/ML IV SOLN COMPARISON:  02/10/2019 pelvic sonogram. 01/01/2019 unenhanced CT abdomen/pelvis. FINDINGS: Urinary Tract:  Normal bladder.  Normal urethra. Bowel: Visualized small and  large bowel are normal caliber with no bowel wall thickening. Mild sigmoid diverticulosis. Vascular/Lymphatic: No pathologically enlarged lymph nodes in the pelvis. No acute vascular abnormality in the pelvis. Reproductive: Status post hysterectomy. No mass or fluid collection at the vaginal cuff. Ovaries and Adnexa: The right ovary measures 2.1 x 1.4 x 1.6 cm and is normal. The left ovary measures 1.9 x 1.2 x 1.4 cm and is normal. There are no ovarian or adnexal masses. The asymmetric left ovarian enlargement seen on 01/01/2019 unenhanced CT abdomen/pelvis study has resolved. Other: No free fluid in the pelvis. No focal pelvic fluid collection. Incidentally noted small simple appearing renal cysts in the lower kidneys. Musculoskeletal: No aggressive appearing focal osseous lesions. IMPRESSION: 1. Symmetric normal postmenopausal ovaries. No ovarian or adnexal masses. The asymmetric left ovarian enlargement seen on 01/01/2019 unenhanced CT abdomen/pelvis study has resolved. 2. Status post hysterectomy, with no abnormality seen at the vaginal cuff. 3. Mild sigmoid diverticulosis. Electronically Signed   By: Ilona Sorrel M.D.   On: 02/24/2019 10:23    Assessment and Plan:   Jennifer Huff is a 69 y.o. y/o female has been referred for GERD and need for colonoscopy  Assessment and Plan: Patient is having very infrequent symptoms of acid reflux, only when she eats exacerbating foods.  She usually  avoids these foods.  She does not have any alarm symptoms.  She is only using Tums about once a week.  An upper endoscopy is not indicated in this setting, as infrequent reflux with exacerbating foods is expected in normal settings. We discussed risks and benefits of procedure and the option of an upper endoscopy at this time and patient is not interested in the procedure either after the discussion.  Colonoscopy indicated for polyp surveillance  I have discussed alternative options, risks & benefits,  which include, but are not limited to, bleeding, infection, perforation,respiratory complication & drug reaction.  The patient agrees with this plan & written consent will be obtained.    Patient educated extensively on acid reflux lifestyle modification, including buying a bed wedge, not eating 3 hrs before bedtime, diet modifications, and handout given for the same.   If reflux symptoms increase in frequency or worsen pt advised to notify us  Follow Up Instructions:   I discussed the assessment and treatment plan with the patient. The patient was provided an opportunity to ask questions and all were answered. The patient agreed with the plan and demonstrated an understanding of the instructions.   The patient was advised to call back or seek an in-person evaluation if the symptoms worsen or if the condition fails to improve as anticipated.  I provided 13 minutes of non-face-to-face time during this encounter.   Virgel Manifold, MD  Speech recognition software was used to dictate the above note.

## 2019-05-02 ENCOUNTER — Other Ambulatory Visit: Payer: Self-pay

## 2019-05-02 ENCOUNTER — Other Ambulatory Visit
Admission: RE | Admit: 2019-05-02 | Discharge: 2019-05-02 | Disposition: A | Discharge status: Home / Self Care | Payer: Medicare PPO | Source: Ambulatory Visit | Attending: Gastroenterology | Admitting: Gastroenterology

## 2019-05-02 DIAGNOSIS — Z20822 Contact with and (suspected) exposure to covid-19: Secondary | ICD-10-CM | POA: Insufficient documentation

## 2019-05-02 DIAGNOSIS — Z01812 Encounter for preprocedural laboratory examination: Secondary | ICD-10-CM | POA: Diagnosis present

## 2019-05-02 LAB — SARS CORONAVIRUS 2 (TAT 6-24 HRS): SARS Coronavirus 2: NEGATIVE

## 2019-05-06 ENCOUNTER — Ambulatory Visit
Admission: RE | Admit: 2019-05-06 | Discharge: 2019-05-06 | Disposition: A | Discharge status: Home / Self Care | Payer: Medicare PPO | Attending: Gastroenterology | Admitting: Gastroenterology

## 2019-05-06 ENCOUNTER — Ambulatory Visit: Payer: Medicare PPO | Admitting: Registered Nurse

## 2019-05-06 ENCOUNTER — Other Ambulatory Visit: Payer: Self-pay

## 2019-05-06 ENCOUNTER — Encounter
Admission: RE | Disposition: A | Discharge status: Home / Self Care | Payer: Self-pay | Source: Home / Self Care | Attending: Gastroenterology

## 2019-05-06 ENCOUNTER — Encounter: Payer: Self-pay | Admitting: Gastroenterology

## 2019-05-06 DIAGNOSIS — Z1211 Encounter for screening for malignant neoplasm of colon: Secondary | ICD-10-CM | POA: Diagnosis not present

## 2019-05-06 DIAGNOSIS — Z87891 Personal history of nicotine dependence: Secondary | ICD-10-CM | POA: Insufficient documentation

## 2019-05-06 DIAGNOSIS — D124 Benign neoplasm of descending colon: Secondary | ICD-10-CM | POA: Diagnosis not present

## 2019-05-06 DIAGNOSIS — K219 Gastro-esophageal reflux disease without esophagitis: Secondary | ICD-10-CM | POA: Diagnosis not present

## 2019-05-06 DIAGNOSIS — Z885 Allergy status to narcotic agent status: Secondary | ICD-10-CM | POA: Diagnosis not present

## 2019-05-06 DIAGNOSIS — Z888 Allergy status to other drugs, medicaments and biological substances status: Secondary | ICD-10-CM | POA: Insufficient documentation

## 2019-05-06 DIAGNOSIS — E7800 Pure hypercholesterolemia, unspecified: Secondary | ICD-10-CM | POA: Diagnosis not present

## 2019-05-06 DIAGNOSIS — Z8249 Family history of ischemic heart disease and other diseases of the circulatory system: Secondary | ICD-10-CM | POA: Diagnosis not present

## 2019-05-06 DIAGNOSIS — I10 Essential (primary) hypertension: Secondary | ICD-10-CM | POA: Insufficient documentation

## 2019-05-06 DIAGNOSIS — Z79899 Other long term (current) drug therapy: Secondary | ICD-10-CM | POA: Insufficient documentation

## 2019-05-06 DIAGNOSIS — K635 Polyp of colon: Secondary | ICD-10-CM

## 2019-05-06 DIAGNOSIS — D123 Benign neoplasm of transverse colon: Secondary | ICD-10-CM | POA: Diagnosis not present

## 2019-05-06 DIAGNOSIS — Z86010 Personal history of colonic polyps: Secondary | ICD-10-CM | POA: Insufficient documentation

## 2019-05-06 HISTORY — PX: COLONOSCOPY WITH PROPOFOL: SHX5780

## 2019-05-06 HISTORY — DX: Gastro-esophageal reflux disease without esophagitis: K21.9

## 2019-05-06 SURGERY — COLONOSCOPY WITH PROPOFOL
Anesthesia: General

## 2019-05-06 MED ORDER — PROPOFOL 500 MG/50ML IV EMUL
INTRAVENOUS | Status: DC | PRN
  Administered 2019-05-06: 140 ug/kg/min via INTRAVENOUS

## 2019-05-06 MED ORDER — PROPOFOL 10 MG/ML IV BOLUS
INTRAVENOUS | Status: DC | PRN
  Administered 2019-05-06: 70 mg via INTRAVENOUS

## 2019-05-06 MED ORDER — SODIUM CHLORIDE 0.9 % IV SOLN: INTRAVENOUS | Status: DC

## 2019-05-06 NOTE — Anesthesia Postprocedure Evaluation (Signed)
Anesthesia Post Note  Patient: Jennifer Huff  Procedure(s) Performed: COLONOSCOPY WITH PROPOFOL (N/A )  Patient location during evaluation: Endoscopy Anesthesia Type: General Level of consciousness: awake and alert Pain management: pain level controlled Vital Signs Assessment: post-procedure vital signs reviewed and stable Respiratory status: spontaneous breathing and respiratory function stable Cardiovascular status: stable Anesthetic complications: no     Last Vitals:  Vitals:   05/06/19 1120 05/06/19 1130  BP: 96/67 90/79  Pulse: 78   Resp: 16   Temp: 36.6 C   SpO2: 95%     Last Pain:  Vitals:   05/06/19 1130  TempSrc:   PainSc: 0-No pain                 Talyn Eddie K

## 2019-05-06 NOTE — H&P (Signed)
[name], MD [address], [place], [place], [place], [address] [address], [place], [place], [place], [address] Phone: [phone]  Fax: [phone]  Primary Care Physician:  Burnard Hawthorne, FNP   Pre-Procedure History & Physical: HPI:  Jennifer Huff is a 69 y.o. female is here for a colonoscopy.   Past Medical History:  Diagnosis Date  . Cataract   . GERD (gastroesophageal reflux disease)   . HTN (hypertension)   . Hypercholesteremia   . Vitamin D deficiency     Past Surgical History:  Procedure Laterality Date  . KNEE ARTHROSCOPY WITH LATERAL MENISECTOMY Right 11/06/2017   Procedure: KNEE ARTHROSCOPY WITH REPAIR LATERAL MENISCAL ROOT TEAR;  Surgeon: Corky Mull, MD;  Location: [facility] ORS;  Service: Orthopedics;  Laterality: Right;  . LAPAROSCOPIC HYSTERECTOMY      Prior to Admission medications   Medication Sig Start Date End Date Taking? Authorizing Provider  acetaminophen (TYLENOL) 500 MG tablet Take 1,000 mg by mouth daily as needed for mild pain.   Yes [provider]  calcium carbonate (TUMS - DOSED IN MG ELEMENTAL CALCIUM) 500 MG chewable tablet Chew 2 tablets by mouth daily as needed for indigestion or heartburn.   Yes [provider]  esomeprazole (NEXIUM) 20 MG capsule Take 20 mg by mouth daily at 12 noon.   Yes [provider]  amLODipine (NORVASC) 10 MG tablet Take 1 tablet (10 mg total) by mouth daily. 07/29/18   Burnard Hawthorne, FNP  Cholecalciferol (VITAMIN D3) 2000 units TABS Take 2,000 Units by mouth at bedtime.     [provider]  KETOROLAC TROMETHAMINE OP Place 1 drop into the left eye 2 (two) times daily as needed (eye pain).    [provider]  losartan (COZAAR) 100 MG tablet Take 1 tablet (100 mg total) by mouth daily. 03/10/19 03/09/20  Burnard Hawthorne, FNP  pravastatin (PRAVACHOL) 40 MG tablet Take 1 tablet (40 mg total) by mouth daily. 07/29/18   Burnard Hawthorne, FNP     Allergies as of 03/20/2019 - Review Complete 03/20/2019  Allergen Reaction Noted  . Ace inhibitors Swelling 12/08/2010  . Codeine Other (See Comments) 12/08/2010    Family History  Problem Relation Age of Onset  . Kidney disease Father   . Heart disease Father        died from mi  . Cancer Father        prostate  . Hypertension Sister   . Colon polyps Sister   . Hypertension Brother   . Hypertension Sister   . Diabetes Mother   . Hypertension Mother   . Cancer Paternal Grandmother        head and neck  . Hypertension Brother   . Cancer Son   . Colon cancer Neg Hx   . Breast cancer Neg Hx     Social History   Socioeconomic History  . Marital status: Married    Spouse name: Not on file  . Number of children: 3  . Years of education: Not on file  . Highest education level: Not on file  Occupational History  . 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 Smoking status: Former Smoker    Quit date: 12/07/1997    Years since quitting: 21.4  . Smokeless tobacco: Never Used  Substance and Sexual Activity  . Alcohol use: Not Currently  . Drug use: No  . Sexual activity: Not Currently  Other Topics Concern  .  Not on file  Social History Narrative   Regular Exercise -  NO   Daily Caffeine Use:  2-3 soda/sw tea      Retired   Scientist, physiological Strain: Low Risk   . Difficulty of Paying Living Expenses: Not hard at all  Food Insecurity: No Food Insecurity  . Worried About Charity fundraiser in the Last Year: Never true  . Ran Out of Food in the Last Year: Never true  Transportation Needs: No Transportation Needs  . Lack of Transportation (Medical): No  . Lack of Transportation (Non-Medical): No  Physical Activity: Unknown  . Days of Exercise per Week: 0 days  . Minutes of Exercise per Session: Not on file  Stress: No Stress Concern Present  . Feeling of Stress : Not at all  Social Connections:   . Frequency of  Communication with Friends and Family:   . Frequency of Social Gatherings with Friends and Family:   . Attends Religious Services:   . Active Member of Clubs or Organizations:   . Attends Archivist Meetings:   Marland Kitchen Marital Status:   Intimate Partner Violence: Not At Risk  . Fear of Current or Ex-Partner: No  . Emotionally Abused: No  . Physically Abused: No  . Sexually Abused: No    Review of Systems: See HPI, otherwise negative ROS  Physical Exam: BP 132/83   Pulse 70   Temp (!) 96.4 F (35.8 C) (Temporal)   Resp 18   Ht 5\' 6"  (1.676 m)   Wt 119.3 kg   SpO2 100%   BMI 42.45 kg/m  General:   Alert,  pleasant and cooperative in NAD Head:  Normocephalic and atraumatic. Neck:  Supple; no masses or thyromegaly. Lungs:  Clear throughout to auscultation, normal respiratory effort.    Heart:  +S1, +S2, Regular rate and rhythm, No edema. Abdomen:  Soft, nontender and nondistended. Normal bowel sounds, without guarding, and without rebound.   Neurologic:  Alert and  oriented x4;  grossly normal neurologically.  Impression/Plan: Jennifer Huff is here for a colonoscopy to be performed for polyp surveillance  Risks, benefits, limitations, and alternatives regarding  colonoscopy have been reviewed with the patient.  Questions have been answered.  All parties agreeable.   Virgel Manifold, MD  05/06/2019, 10:44 AM

## 2019-05-06 NOTE — Anesthesia Preprocedure Evaluation (Signed)
Anesthesia Evaluation  Patient identified by MRN, date of birth, ID band Patient awake    Reviewed: Allergy & Precautions, NPO status , Patient's Chart, lab work & pertinent test results  History of Anesthesia Complications Negative for: history of anesthetic complications  Airway Mallampati: III       Dental  (+) Partial Upper   Pulmonary neg sleep apnea, neg COPD, Not current smoker, former smoker,           Cardiovascular hypertension, Pt. on medications (-) Past MI and (-) CHF (-) dysrhythmias (-) Valvular Problems/Murmurs     Neuro/Psych neg Seizures Anxiety Depression    GI/Hepatic Neg liver ROS, GERD  Medicated and Controlled,  Endo/Other  neg diabetes  Renal/GU Renal InsufficiencyRenal disease     Musculoskeletal   Abdominal   Peds  Hematology   Anesthesia Other Findings   Reproductive/Obstetrics                             Anesthesia Physical Anesthesia Plan  ASA: II  Anesthesia Plan: General   Post-op Pain Management:    Induction: Intravenous  PONV Risk Score and Plan: 3 and Propofol infusion and TIVA  Airway Management Planned: Nasal Cannula  Additional Equipment:   Intra-op Plan:   Post-operative Plan:   Informed Consent: I have reviewed the patients History and Physical, chart, labs and discussed the procedure including the risks, benefits and alternatives for the proposed anesthesia with the patient or authorized representative who has indicated his/her understanding and acceptance.       Plan Discussed with:   Anesthesia Plan Comments:         Anesthesia Quick Evaluation

## 2019-05-06 NOTE — Transfer of Care (Signed)
Immediate Anesthesia Transfer of Care Note  Patient: Jennifer Huff  Procedure(s) Performed: COLONOSCOPY WITH PROPOFOL (N/A )  Patient Location: PACU  Anesthesia Type:General  Level of Consciousness: sedated  Airway & Oxygen Therapy: Patient Spontanous Breathing and Patient connected to nasal cannula oxygen  Post-op Assessment: Report given to RN and Post -op Vital signs reviewed and stable  Post vital signs: Reviewed and stable  Last Vitals:  Vitals Value Taken Time  BP 96/67 05/06/19 1120  Temp    Pulse 78 05/06/19 1121  Resp 15 05/06/19 1121  SpO2 95 % 05/06/19 1121  Vitals shown include unvalidated device data.  Last Pain:  Vitals:   05/06/19 0949  TempSrc: Temporal  PainSc: 0-No pain         Complications: No apparent anesthesia complications

## 2019-05-06 NOTE — Op Note (Signed)
[facility] Gastroenterology Patient Name: Jennifer Huff Procedure Date: 05/06/2019 10:41 AM MRN: [id number] Account #: [id number] Date of Birth: Jul 17, 1950 Admit Type: Outpatient Age: 69 Room: [facility] ENDO ROOM 2 Gender: Female Note Status: Finalized Procedure:             Colonoscopy Indications:           High risk colon cancer surveillance: Personal history                         of colonic polyps Providers:             Joshuah Minella B. Bonna Gains MD, MD Referring MD:          Yvetta Coder. Arnett (Referring MD) Medicines:             Monitored Anesthesia Care Complications:         No immediate complications. Procedure:             Pre-Anesthesia Assessment:                        - ASA Grade Assessment: II - A patient with mild                         systemic disease.                        - Prior to the procedure, a History and Physical was                         performed, and patient medications, allergies and                         sensitivities were reviewed. The patient's tolerance                         of previous anesthesia was reviewed.                        - The risks and benefits of the procedure and the                         sedation options and risks were discussed with the                         patient. All questions were answered and informed                         consent was obtained.                        - Patient identification and proposed procedure were                         verified prior to the procedure by the physician, the                         nurse, the anesthesiologist, the anesthetist and the                         technician. The procedure was  verified in the                         procedure room.                        After obtaining informed consent, the colonoscope was                         passed under direct vision. Throughout the procedure,                         the patient's blood pressure, pulse, and  oxygen                         saturations were monitored continuously. The                         Colonoscope was introduced through the anus and                         advanced to the the cecum, identified by appendiceal                         orifice and ileocecal valve. The colonoscopy was                         performed with ease. The patient tolerated the                         procedure well. The quality of the bowel preparation                         was good. Findings:      The perianal and digital rectal examinations were normal.      Three sessile polyps were found in the descending colon and transverse       colon. The polyps were 4 to 5 mm in size. These polyps were removed with       a jumbo cold forceps. Resection and retrieval were complete.      The exam was otherwise without abnormality.      The rectum, sigmoid colon, descending colon, transverse colon, ascending       colon and cecum appeared normal.      The retroflexed view of the distal rectum and anal verge was normal and       showed no anal or rectal abnormalities. Impression:            - Three 4 to 5 mm polyps in the descending colon and                         in the transverse colon, removed with a jumbo cold                         forceps. Resected and retrieved.                        - The examination was otherwise normal.                        -  The rectum, sigmoid colon, descending colon,                         transverse colon, ascending colon and cecum are normal.                        - The distal rectum and anal verge are normal on                         retroflexion view. Recommendation:        - Discharge patient to home (with escort).                        - Advance diet as tolerated.                        - Continue present medications.                        - Await pathology results.                        - Repeat colonoscopy in 5 years.                        - The findings  and recommendations were discussed with                         the patient.                        - The findings and recommendations were discussed with                         the patient's family.                        - Return to primary care physician as previously                         scheduled. Procedure Code(s):     --- Professional ---                        [phone], Colonoscopy, flexible; with biopsy, single or                         multiple Diagnosis Code(s):     --- Professional ---                        Z86.010, Personal history of colonic polyps                        K63.5, Polyp of colon CPT copyright 2019 American Medical Association. All rights reserved. The codes documented in this report are preliminary and upon coder review may  be revised to meet current compliance requirements.  [name], MD Margretta Sidle B. Bonna Gains MD, MD 05/06/2019 11:24:14 AM This report has been signed electronically. Number of Addenda: 0 Note Initiated On: 05/06/2019 10:41 AM Scope Withdrawal Time: 0 hours 16 minutes 36 seconds  Total Procedure Duration: 0 hours 23 minutes 12  seconds  Estimated Blood Loss:  Estimated blood loss: none.      [facility]

## 2019-05-07 ENCOUNTER — Encounter: Payer: Self-pay | Admitting: Gastroenterology

## 2019-05-07 ENCOUNTER — Encounter: Payer: Self-pay | Admitting: *Deleted

## 2019-05-07 LAB — SURGICAL PATHOLOGY

## 2019-05-21 ENCOUNTER — Ambulatory Visit (INDEPENDENT_AMBULATORY_CARE_PROVIDER_SITE_OTHER): Payer: Medicare PPO | Admitting: Gastroenterology

## 2019-05-21 ENCOUNTER — Other Ambulatory Visit: Payer: Self-pay

## 2019-05-21 ENCOUNTER — Encounter: Payer: Self-pay | Admitting: Gastroenterology

## 2019-05-21 VITALS — BP 128/75 | HR 88 | Temp 98.0°F | Ht 66.0 in | Wt 262.2 lb

## 2019-05-21 DIAGNOSIS — R718 Other abnormality of red blood cells: Secondary | ICD-10-CM

## 2019-05-21 DIAGNOSIS — R12 Heartburn: Secondary | ICD-10-CM

## 2019-05-21 NOTE — Patient Instructions (Addendum)
Please discontinue taking your Nexium. We will see you back in 6 months.   High-Fiber Diet Fiber, also called dietary fiber, is a type of carbohydrate that is found in fruits, vegetables, whole grains, and beans. A high-fiber diet can have many health benefits. Your health care provider may recommend a high-fiber diet to help:  Prevent constipation. Fiber can make your bowel movements more regular.  Lower your cholesterol.  Relieve the following conditions: ? Swelling of veins in the anus (hemorrhoids). ? Swelling and irritation (inflammation) of specific areas of the digestive tract (uncomplicated diverticulosis). ? A problem of the large intestine (colon) that sometimes causes pain and diarrhea (irritable bowel syndrome, IBS).  Prevent overeating as part of a weight-loss plan.  Prevent heart disease, type 2 diabetes, and certain cancers. What is my plan? The recommended daily fiber intake in grams (g) includes:  38 g for men age 58 or younger.  30 g for men over age 30.  7 g for women age 54 or younger.  21 g for women over age 60. You can get the recommended daily intake of dietary fiber by:  Eating a variety of fruits, vegetables, grains, and beans.  Taking a fiber supplement, if it is not possible to get enough fiber through your diet. What do I need to know about a high-fiber diet?  It is better to get fiber through food sources rather than from fiber supplements. There is not a lot of research about how effective supplements are.  Always check the fiber content on the nutrition facts label of any prepackaged food. Look for foods that contain 5 g of fiber or more per serving.  Talk with a diet and nutrition specialist (dietitian) if you have questions about specific foods that are recommended or not recommended for your medical condition, especially if those foods are not listed below.  Gradually increase how much fiber you consume. If you increase your intake of  dietary fiber too quickly, you may have bloating, cramping, or gas.  Drink plenty of water. Water helps you to digest fiber. What are tips for following this plan?  Eat a wide variety of high-fiber foods.  Make sure that half of the grains that you eat each day are whole grains.  Eat breads and cereals that are made with whole-grain flour instead of refined flour or white flour.  Eat brown rice, bulgur wheat, or millet instead of white rice.  Start the day with a breakfast that is high in fiber, such as a cereal that contains 5 g of fiber or more per serving.  Use beans in place of meat in soups, salads, and pasta dishes.  Eat high-fiber snacks, such as berries, raw vegetables, nuts, and popcorn.  Choose whole fruits and vegetables instead of processed forms like juice or sauce. What foods can I eat?  Fruits Berries. Pears. Apples. Oranges. Avocado. Prunes and raisins. Dried figs. Vegetables Sweet potatoes. Spinach. Kale. Artichokes. Cabbage. Broccoli. Cauliflower. Green peas. Carrots. Squash. Grains Whole-grain breads. Multigrain cereal. Oats and oatmeal. Brown rice. Barley. Bulgur wheat. [place]. Quinoa. Bran muffins. Popcorn. Rye wafer crackers. Meats and other proteins Navy, kidney, and pinto beans. Soybeans. Split peas. Lentils. Nuts and seeds. Dairy Fiber-fortified yogurt. Beverages Fiber-fortified soy milk. Fiber-fortified orange juice. Other foods Fiber bars. The items listed above may not be a complete list of recommended foods and beverages. Contact a dietitian for more options. What foods are not recommended? Fruits Fruit juice. Cooked, strained fruit. Vegetables Fried potatoes. Canned vegetables.  Well-cooked vegetables. Grains White bread. Pasta made with refined flour. White rice. Meats and other proteins Fatty cuts of meat. Fried chicken or fried fish. Dairy Milk. Yogurt. Cream cheese. Sour cream. Fats and oils Butters. Beverages Soft drinks. Other  foods Cakes and pastries. The items listed above may not be a complete list of foods and beverages to avoid. Contact a dietitian for more information. Summary  Fiber is a type of carbohydrate. It is found in fruits, vegetables, whole grains, and beans.  There are many health benefits of eating a high-fiber diet, such as preventing constipation, lowering blood cholesterol, helping with weight loss, and reducing your risk of heart disease, diabetes, and certain cancers.  Gradually increase your intake of fiber. Increasing too fast can result in cramping, bloating, and gas. Drink plenty of water while you increase your fiber.  The best sources of fiber include whole fruits and vegetables, whole grains, nuts, seeds, and beans. This information is not intended to replace advice given to you by your health care provider. Make sure you discuss any questions you have with your health care provider. Document Revised: 10/23/2016 Document Reviewed: 10/23/2016 Elsevier Patient Education  [place]. Gastroesophageal Reflux Disease, Adult Gastroesophageal reflux (GER) happens when acid from the stomach flows up into the tube that connects the mouth and the stomach (esophagus). Normally, food travels down the esophagus and stays in the stomach to be digested. With GER, food and stomach acid sometimes move back up into the esophagus. You may have a disease called gastroesophageal reflux disease (GERD) if the reflux:  Happens often.  Causes frequent or very bad symptoms.  Causes problems such as damage to the esophagus. When this happens, the esophagus becomes sore and swollen (inflamed). Over time, GERD can make small holes (ulcers) in the lining of the esophagus. What are the causes? This condition is caused by a problem with the muscle between the esophagus and the stomach. When this muscle is weak or not normal, it does not close properly to keep food and acid from coming back up from the  stomach. The muscle can be weak because of:  Tobacco use.  Pregnancy.  Having a certain type of hernia (hiatal hernia).  Alcohol use.  Certain foods and drinks, such as coffee, chocolate, onions, and peppermint. What increases the risk? You are more likely to develop this condition if you:  Are overweight.  Have a disease that affects your connective tissue.  Use NSAID medicines. What are the signs or symptoms? Symptoms of this condition include:  Heartburn.  Difficult or painful swallowing.  The feeling of having a lump in the throat.  A bitter taste in the mouth.  Bad breath.  Having a lot of saliva.  Having an upset or bloated stomach.  Belching.  Chest pain. Different conditions can cause chest pain. Make sure you see your doctor if you have chest pain.  Shortness of breath or noisy breathing (wheezing).  Ongoing (chronic) cough or a cough at night.  Wearing away of the surface of teeth (tooth enamel).  Weight loss. How is this treated? Treatment will depend on how bad your symptoms are. Your doctor may suggest:  Changes to your diet.  Medicine.  Surgery. Follow these instructions at home: Eating and drinking   Follow a diet as told by your doctor. You may need to avoid foods and drinks such as: ? Coffee and tea (with or without caffeine). ? Drinks that contain alcohol. ? Energy drinks and sports drinks. ?  Bubbly (carbonated) drinks or sodas. ? Chocolate and cocoa. ? Peppermint and mint flavorings. ? Garlic and onions. ? Horseradish. ? Spicy and acidic foods. These include peppers, chili powder, curry powder, vinegar, hot sauces, and BBQ sauce. ? Citrus fruit juices and citrus fruits, such as oranges, lemons, and limes. ? Tomato-based foods. These include red sauce, chili, salsa, and pizza with red sauce. ? Fried and fatty foods. These include donuts, french fries, potato chips, and high-fat dressings. ? High-fat meats. These include hot  dogs, rib eye steak, sausage, ham, and bacon. ? High-fat dairy items, such as whole milk, butter, and cream cheese.  Eat small meals often. Avoid eating large meals.  Avoid drinking large amounts of liquid with your meals.  Avoid eating meals during the 2-3 hours before bedtime.  Avoid lying down right after you eat.  Do not exercise right after you eat. Lifestyle   Do not use any products that contain nicotine or tobacco. These include cigarettes, e-cigarettes, and chewing tobacco. If you need help quitting, ask your doctor.  Try to lower your stress. If you need help doing this, ask your doctor.  If you are overweight, lose an amount of weight that is healthy for you. Ask your doctor about a safe weight loss goal. General instructions  Pay attention to any changes in your symptoms.  Take over-the-counter and prescription medicines only as told by your doctor. Do not take aspirin, ibuprofen, or other NSAIDs unless your doctor says it is okay.  Wear loose clothes. Do not wear anything tight around your waist.  Raise (elevate) the head of your bed about 6 inches (15 cm).  Avoid bending over if this makes your symptoms worse.  Keep all follow-up visits as told by your doctor. This is important. Contact a doctor if:  You have new symptoms.  You lose weight and you do not know why.  You have trouble swallowing or it hurts to swallow.  You have wheezing or a cough that keeps happening.  Your symptoms do not get better with treatment.  You have a hoarse voice. Get help right away if:  You have pain in your arms, neck, jaw, teeth, or back.  You feel sweaty, dizzy, or light-headed.  You have chest pain or shortness of breath.  You throw up (vomit) and your throw-up looks like blood or coffee grounds.  You pass out (faint).  Your poop (stool) is bloody or black.  You cannot swallow, drink, or eat. Summary  If a person has gastroesophageal reflux disease (GERD),  food and stomach acid move back up into the esophagus and cause symptoms or problems such as damage to the esophagus.  Treatment will depend on how bad your symptoms are.  Follow a diet as told by your doctor.  Take all medicines only as told by your doctor. This information is not intended to replace advice given to you by your health care provider. Make sure you discuss any questions you have with your health care provider. Document Revised: 06/27/2017 Document Reviewed: 06/27/2017 Elsevier Patient Education  [place].

## 2019-05-22 NOTE — Progress Notes (Signed)
[name], MD [address]  [place]  [place], [place] [address]  Main: [phone]  Fax: [phone]   Primary Care Physician: Burnard Hawthorne, FNP   Chief Complaint  Patient presents with  . Follow-up    Patient is here today to follow up on her colonoscopy.     HPI: Jennifer Huff is a 69 y.o. female here for follow-up of GERD.  Patient reports symptoms only occur when she eats trigger foods, about once or twice a month.  Takes Nexium as needed on that day only.  No dysphagia.  No abdominal pain.  No nausea or vomiting.  Underwent colonoscopy for polyp surveillance due to previous history of tubular adenoma polyps, see previous note, which showed small tubular adenoma polyps in May 2021  Current Outpatient Medications  Medication Sig Dispense Refill  . acetaminophen (TYLENOL) 500 MG tablet Take 1,000 mg by mouth daily as needed for mild pain.    Marland Kitchen amLODipine (NORVASC) 10 MG tablet Take 1 tablet (10 mg total) by mouth daily. 90 tablet 4  . calcium carbonate (TUMS - DOSED IN MG ELEMENTAL CALCIUM) 500 MG chewable tablet Chew 2 tablets by mouth daily as needed for indigestion or heartburn.    . Cholecalciferol (VITAMIN D3) 2000 units TABS Take 2,000 Units by mouth at bedtime.     Marland Kitchen esomeprazole (NEXIUM) 20 MG capsule Take 20 mg by mouth daily at 12 noon.    Marland Kitchen KETOROLAC TROMETHAMINE OP Place 1 drop into the left eye 2 (two) times daily as needed (eye pain).    Marland Kitchen losartan (COZAAR) 100 MG tablet Take 1 tablet (100 mg total) by mouth daily. 90 tablet 0  . pravastatin (PRAVACHOL) 40 MG tablet Take 1 tablet (40 mg total) by mouth daily. 90 tablet 4   No current facility-administered medications for this visit.    Allergies as of 05/21/2019 - Review Complete 05/21/2019  Allergen Reaction Noted  . Ace inhibitors Swelling 12/08/2010  . Codeine Other (See Comments) 12/08/2010    ROS:  General: Negative for anorexia, weight loss, fever, chills, fatigue,  weakness. ENT: Negative for hoarseness, difficulty swallowing , nasal congestion. CV: Negative for chest pain, angina, palpitations, dyspnea on exertion, peripheral edema.  Respiratory: Negative for dyspnea at rest, dyspnea on exertion, cough, sputum, wheezing.  GI: See history of present illness. GU:  Negative for dysuria, hematuria, urinary incontinence, urinary frequency, nocturnal urination.  Endo: Negative for unusual weight change.    Physical Examination:   BP 128/75   Pulse 88   Temp 98 F (36.7 C) (Oral)   Ht 5\' 6"  (1.676 m)   Wt 262 lb 3.2 oz (118.9 kg)   BMI 42.32 kg/m   General: Well-nourished, well-developed in no acute distress.  Eyes: No icterus. Conjunctivae pink. Mouth: Oropharyngeal mucosa moist and pink , no lesions erythema or exudate. Neck: Supple, Trachea midline Abdomen: Bowel sounds are normal, nontender, nondistended, no hepatosplenomegaly or masses, no abdominal bruits or hernia , no rebound or guarding.   Extremities: No lower extremity edema. No clubbing or deformities. Neuro: Alert and oriented x 3.  Grossly intact. Skin: Warm and dry, no jaundice.   Psych: Alert and cooperative, normal mood and affect.  Labs: MCV was low in December 2020, done by PCP.     Assessment and Plan:   Jennifer Huff is a 68 y.o. y/o female with occasional heartburn, only when eating trigger foods, here for follow-up  Patient advised to not use Nexium  as an as needed medication, and use Tums instead since she is only using it about once or twice a month.  However, if symptoms become more frequent she was advised to let us know and she can be started on an H2 RA daily for short-term if this occurs.  No alarm symptoms present, no indication for endoscopy at this time given symptoms are very infrequent and as expected  Patient educated extensively on acid reflux lifestyle modification, including buying a bed wedge, not eating 3 hrs before bedtime, diet modifications,  and handout given for the same.   Repeat CBC due to low MCV noted previously.  Polyp surveillance in 5 years  Dr [name]

## 2019-05-27 ENCOUNTER — Telehealth: Payer: Self-pay

## 2019-05-27 NOTE — Telephone Encounter (Signed)
Virgel Manifold, MD  Wayna Chalet, CMA  Please let the patient know that because her MCV was low in December 2020, I would like to obtain her CBC again to evaluate if she has anemia or iron deficiency. I have ordered this.   Called patient but had to leave her a message letting her know that Dr. Bonna Gains would like to check and see if she had low iron or if she was anemic. Patient is to go to a [facility] location or come to our office whenever we had a lab teq.

## 2019-06-05 ENCOUNTER — Other Ambulatory Visit: Payer: Self-pay | Admitting: Family

## 2019-06-05 DIAGNOSIS — I10 Essential (primary) hypertension: Secondary | ICD-10-CM

## 2019-06-05 LAB — IRON AND TIBC
Iron Saturation: 20 % (ref 15–55)
Iron: 60 ug/dL (ref 27–139)
Total Iron Binding Capacity: 294 ug/dL (ref 250–450)
UIBC: 234 ug/dL (ref 118–369)

## 2019-06-05 LAB — FERRITIN: Ferritin: 284 ng/mL — ABNORMAL HIGH (ref 15–150)

## 2019-06-05 NOTE — Telephone Encounter (Signed)
Patient had her labs done and results are back. Please review and let me know what else to do. Thank you.

## 2019-06-13 ENCOUNTER — Telehealth: Payer: Self-pay

## 2019-06-13 NOTE — Telephone Encounter (Signed)
-----   Message from Virgel Manifold, MD sent at 06/13/2019 11:08 AM EDT ----- Herb Grays please let the patient know, her iron level was normal. She should follow up with Dr. Tasia Catchings for microcytosis noted on her blood work

## 2019-06-13 NOTE — Telephone Encounter (Addendum)
This patient was last seen on 01/10/19 for adrenal mass, and per last office visit note MD was TBD. I was contacted by Jennifer Huff with Dr. Bonna Gains requesting for pt to follow up with Dr. Tasia Catchings for microcytosis. Please advise on scheduling.

## 2019-06-13 NOTE — Telephone Encounter (Signed)
Called patient to let her know what her lab results were and that Dr. Bonna Gains wanted her to follow up with Dr. Tasia Catchings for her microcytosis. Patient understood and had no further questions.

## 2019-06-16 NOTE — Telephone Encounter (Signed)
Please schedule him for lab md, please order cbc, smear, retic panel, cmp. Thanks.

## 2019-06-16 NOTE — Telephone Encounter (Signed)
Done..   Pt is aware of her sched 06/23/19 lab/MD appt

## 2019-06-16 NOTE — Telephone Encounter (Signed)
Ellison Hughs, please schedule pt for lab/MD this week or next.

## 2019-06-20 ENCOUNTER — Other Ambulatory Visit: Payer: Self-pay

## 2019-06-20 DIAGNOSIS — E278 Other specified disorders of adrenal gland: Secondary | ICD-10-CM

## 2019-06-23 ENCOUNTER — Inpatient Hospital Stay: Payer: Medicare PPO | Attending: Oncology

## 2019-06-23 ENCOUNTER — Encounter: Payer: Self-pay | Admitting: Oncology

## 2019-06-23 ENCOUNTER — Inpatient Hospital Stay (HOSPITAL_BASED_OUTPATIENT_CLINIC_OR_DEPARTMENT_OTHER): Payer: Medicare PPO | Admitting: Oncology

## 2019-06-23 ENCOUNTER — Other Ambulatory Visit: Payer: Self-pay

## 2019-06-23 VITALS — BP 127/82 | HR 69 | Temp 96.3°F | Resp 16 | Wt 266.2 lb

## 2019-06-23 DIAGNOSIS — K219 Gastro-esophageal reflux disease without esophagitis: Secondary | ICD-10-CM | POA: Diagnosis not present

## 2019-06-23 DIAGNOSIS — Z79899 Other long term (current) drug therapy: Secondary | ICD-10-CM | POA: Insufficient documentation

## 2019-06-23 DIAGNOSIS — Z800 Family history of malignant neoplasm of digestive organs: Secondary | ICD-10-CM | POA: Insufficient documentation

## 2019-06-23 DIAGNOSIS — K5900 Constipation, unspecified: Secondary | ICD-10-CM | POA: Insufficient documentation

## 2019-06-23 DIAGNOSIS — E279 Disorder of adrenal gland, unspecified: Secondary | ICD-10-CM | POA: Diagnosis not present

## 2019-06-23 DIAGNOSIS — E278 Other specified disorders of adrenal gland: Secondary | ICD-10-CM

## 2019-06-23 DIAGNOSIS — Z803 Family history of malignant neoplasm of breast: Secondary | ICD-10-CM | POA: Insufficient documentation

## 2019-06-23 DIAGNOSIS — Z8616 Personal history of COVID-19: Secondary | ICD-10-CM | POA: Diagnosis not present

## 2019-06-23 DIAGNOSIS — Z87891 Personal history of nicotine dependence: Secondary | ICD-10-CM | POA: Diagnosis not present

## 2019-06-23 DIAGNOSIS — I10 Essential (primary) hypertension: Secondary | ICD-10-CM | POA: Diagnosis not present

## 2019-06-23 DIAGNOSIS — E559 Vitamin D deficiency, unspecified: Secondary | ICD-10-CM | POA: Diagnosis not present

## 2019-06-23 DIAGNOSIS — R5383 Other fatigue: Secondary | ICD-10-CM | POA: Diagnosis not present

## 2019-06-23 DIAGNOSIS — D649 Anemia, unspecified: Secondary | ICD-10-CM

## 2019-06-23 DIAGNOSIS — Z8041 Family history of malignant neoplasm of ovary: Secondary | ICD-10-CM | POA: Insufficient documentation

## 2019-06-23 DIAGNOSIS — E7800 Pure hypercholesterolemia, unspecified: Secondary | ICD-10-CM | POA: Insufficient documentation

## 2019-06-23 LAB — RETIC PANEL
Immature Retic Fract: 11.8 % (ref 2.3–15.9)
RBC.: 4.23 MIL/uL (ref 3.87–5.11)
Retic Count, Absolute: 70.6 10*3/uL (ref 19.0–186.0)
Retic Ct Pct: 1.7 % (ref 0.4–3.1)
Reticulocyte Hemoglobin: 31.4 pg (ref >27.9)

## 2019-06-23 LAB — CBC WITH DIFFERENTIAL/PLATELET
Abs Immature Granulocytes: 0.03 10*3/uL (ref 0.00–0.07)
Basophils Absolute: 0 10*3/uL (ref 0.0–0.1)
Basophils Relative: 0 %
Eosinophils Absolute: 0.1 10*3/uL (ref 0.0–0.5)
Eosinophils Relative: 1 %
HCT: 35 % — ABNORMAL LOW (ref 36.0–46.0)
Hemoglobin: 11.7 g/dL — ABNORMAL LOW (ref 12.0–15.0)
Immature Granulocytes: 1 %
Lymphocytes Relative: 29 %
Lymphs Abs: 1.7 10*3/uL (ref 0.7–4.0)
MCH: 27.9 pg (ref 26.0–34.0)
MCHC: 33.4 g/dL (ref 30.0–36.0)
MCV: 83.5 fL (ref 80.0–100.0)
Monocytes Absolute: 0.8 10*3/uL (ref 0.1–1.0)
Monocytes Relative: 14 %
Neutro Abs: 3.4 10*3/uL (ref 1.7–7.7)
Neutrophils Relative %: 55 %
Platelets: 173 10*3/uL (ref 150–400)
RBC: 4.19 MIL/uL (ref 3.87–5.11)
RDW: 14.3 % (ref 11.5–15.5)
WBC: 6 10*3/uL (ref 4.0–10.5)
nRBC: 0 % (ref 0.0–0.2)

## 2019-06-23 LAB — COMPREHENSIVE METABOLIC PANEL
ALT: 11 U/L (ref 0–44)
AST: 15 U/L (ref 15–41)
Albumin: 3.8 g/dL (ref 3.5–5.0)
Alkaline Phosphatase: 84 U/L (ref 38–126)
Anion gap: 8 (ref 5–15)
BUN: 19 mg/dL (ref 8–23)
CO2: 24 mmol/L (ref 22–32)
Calcium: 9.4 mg/dL (ref 8.9–10.3)
Chloride: 110 mmol/L (ref 98–111)
Creatinine, Ser: 1.42 mg/dL — ABNORMAL HIGH (ref 0.44–1.00)
GFR calc Af Amer: 44 mL/min — ABNORMAL LOW (ref >60)
GFR calc non Af Amer: 38 mL/min — ABNORMAL LOW (ref >60)
Glucose, Bld: 130 mg/dL — ABNORMAL HIGH (ref 70–99)
Potassium: 4.2 mmol/L (ref 3.5–5.1)
Sodium: 142 mmol/L (ref 135–145)
Total Bilirubin: 0.8 mg/dL (ref 0.3–1.2)
Total Protein: 7.5 g/dL (ref 6.5–8.1)

## 2019-06-23 LAB — TECHNOLOGIST SMEAR REVIEW: Plt Morphology: ADEQUATE

## 2019-06-23 MED ORDER — FERROUS SULFATE 325 (65 FE) MG PO TBEC
325.0000 mg | DELAYED_RELEASE_TABLET | Freq: Two times a day (BID) | ORAL | 2 refills | Status: DC
Start: 2019-06-23 — End: 2019-08-19

## 2019-06-23 MED ORDER — DOCUSATE SODIUM 100 MG PO CAPS
100.0000 mg | ORAL_CAPSULE | Freq: Every day | ORAL | 0 refills | Status: DC
Start: 2019-06-23 — End: 2019-09-18

## 2019-06-23 NOTE — Progress Notes (Signed)
Hematology/Oncology follow up note [name] [phone] [phone]   Patient Care Team: Arnett, Margaret G, FNP as PCP - General (Family Medicine) Stanton, Kristi D, RN as Oncology Nurse Navigator  REFERRING PROVIDER: Arnett, Margaret G, FNP  CHIEF COMPLAINTS/REASON FOR VISIT:  Follow-up visit for adrenal mass, microcytosis.  HISTORY OF PRESENTING ILLNESS:  Jennifer Huff is a  69 y.o.  female with PMH listed below was seen in consultation at the request of  Arnett, Margaret G, FNP  for evaluation of adrenal mass  Patient recently presented to emergency room on 01/01/2019 complaining left sided abdominal/back pain.  Lasted for about 24 hours, pain started in the left back radiating around to the left lower quadrant. Prior to that she was diagnosed COVID-19 infection on 12/21/2018. Patient had CT renal stone study done on 01/01/2019 With normal expansion, heterogeneously density and stranding with adjacent fluid along the left ovary. In addition, there is no specific mass of the left measuring up to 2.8 cm in long axis with marginal stranding. There is at least 4 abnormal hypodense lesion in the spleen.  Bilateral renal lesions.  Faint subpleural groundglass opacities in both lungs.  01/01/2019 US Pelvis showed a heterogeneously enlarged left ovary for postmenopausal patient.  Ovarian volume 51.7.  Ovarian parenchyma is heterogeneous.  Blood flow without evidence of torsion.  Ovarian neoplasm is considered, though a discrete cystic or solid mass is difficult to delineate from diffuse heterogeneity.  Small volume of pelvic free fluid.  #01/01/2019 MRI abdomen with and without contrast showed no acute findings noted in abdomen to account for patient's symptoms.  2.9 x 1.9 cm left adrenal nodule has suspicious characteristics.  Patchy ill-defined areas of increased signal intensity in the visualized lung bases which likely reflux.  19  infection.  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    Patient was released from the emergency room to follow-up with primary care provider and GYN physician. Patient has an appointment with GYN on 01/16/2019. Patient was referred by primary care provider to cancer center for evaluation of adrenal mass.  Patient was accompanied by her husband.  She denies any additional episodes of left lower quadrant discomfort.  No pain  #History of COVID-19 infection   INTERVAL HISTORY Jennifer Huff is a 69 y.o. female who has above history reviewed by me today presents for follow up visit for management of renal mass and patient was also referred back for microcytic anemia. Problems and complaints are listed below: Patient was seen by urology Dr. 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  No intervention was recommended for adrenal adenoma at this point.  She was recommended to repeat MRI in 1 year to ensure stability or return sooner to urology if she develops symptoms. Patient was recently seen by gastroenterology on 05/21/2019.  Colonoscopy was done which showed multiple polyps.  No malignancy.  Patient was recommended to repeat colonoscopy in 5 years. She was noted to have microcytosis and was referred back to hematology oncology for further evaluation.  Patient has no new complaints today.  She was accompanied by her husband today.  Review of Systems  Constitutional: Positive for fatigue. Negative for appetite change, chills and fever.  HENT:   Negative for hearing loss and voice change.   Eyes: Negative for eye problems.  Respiratory: Negative for chest tightness and   cough.   Cardiovascular: Negative for chest pain.  Gastrointestinal: Negative for abdominal distention, abdominal pain and blood in stool.  Endocrine: Negative for  hot flashes.  Genitourinary: Negative for difficulty urinating and frequency.   Musculoskeletal: Negative for arthralgias.  Skin: Negative for itching and rash.  Neurological: Negative for extremity weakness.  Hematological: Negative for adenopathy.  Psychiatric/Behavioral: Negative for confusion.    MEDICAL HISTORY:  Past Medical History:  Diagnosis Date  . Cataract   . GERD (gastroesophageal reflux disease)   . HTN (hypertension)   . Hypercholesteremia   . Vitamin D deficiency     SURGICAL HISTORY: Past Surgical History:  Procedure Laterality Date  . COLONOSCOPY WITH PROPOFOL N/A 05/06/2019   Procedure: COLONOSCOPY WITH PROPOFOL;  Surgeon: Tahiliani, Varnita B, MD;  Location: [facility] ENDOSCOPY;  Service: Endoscopy;  Laterality: N/A;  . KNEE ARTHROSCOPY WITH LATERAL MENISECTOMY Right 11/06/2017   Procedure: KNEE ARTHROSCOPY WITH REPAIR LATERAL MENISCAL ROOT TEAR;  Surgeon: Poggi, John J, MD;  Location: [facility] ORS;  Service: Orthopedics;  Laterality: Right;  . LAPAROSCOPIC HYSTERECTOMY      SOCIAL HISTORY: Social History   Socioeconomic History  . Marital status: Married    Spouse name: Not on file  . Number of children: 3  . Years of education: Not on file  . Highest education level: Not on file  Occupational History  . 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 Smoking status: Former Smoker    Quit date: 12/07/1997    Years since quitting: 21.5  . Smokeless tobacco: Never Used  Vaping Use  . Vaping Use: Never used  Substance and Sexual Activity  . Alcohol use: Not Currently  . Drug use: No  . Sexual activity: Not Currently  Other Topics Concern  . Not on file  Social History Narrative   Regular Exercise -  NO   Daily Caffeine Use:  2-3 soda/sw tea      Retired   Social Determinants of Health   Financial Resource Strain: Low Risk   . Difficulty of Paying Living Expenses: Not hard at all  Food Insecurity: No Food Insecurity  . Worried About  Running Out of Food in the Last Year: Never true  . Ran Out of Food in the Last Year: Never true  Transportation Needs: No Transportation Needs  . Lack of Transportation (Medical): No  . Lack of Transportation (Non-Medical): No  Physical Activity: Unknown  . Days of Exercise per Week: 0 days  . Minutes of Exercise per Session: Not on file  Stress: No Stress Concern Present  . Feeling of Stress : Not at all  Social Connections:   . Frequency of Communication with Friends and Family:   . Frequency of Social Gatherings with Friends and Family:   . Attends Religious Services:   . Active Member of Clubs or Organizations:   . Attends Club or Organization Meetings:   . Marital Status:   Intimate Partner Violence: Not At Risk  . Fear of Current or Ex-Partner: No  . Emotionally Abused: No  . Physically Abused: No  . Sexually Abused: No    FAMILY HISTORY: Family History  Problem Relation Age of Onset  . Kidney disease Father   . Heart disease Father        died from mi  . Cancer Father        prostate  . Hypertension Sister   . Colon polyps Sister   . Hypertension Brother   .   Hypertension Sister   . Diabetes Mother   . Hypertension Mother   . Cancer Paternal Grandmother        head and neck  . Hypertension Brother   . Cancer Son   . Colon cancer Neg Hx   . Breast cancer Neg Hx     ALLERGIES:  is allergic to ace inhibitors and codeine.  MEDICATIONS:  Current Outpatient Medications  Medication Sig Dispense Refill  . acetaminophen (TYLENOL) 500 MG tablet Take 1,000 mg by mouth daily as needed for mild pain.    . amLODipine (NORVASC) 10 MG tablet Take 1 tablet (10 mg total) by mouth daily. 90 tablet 4  . calcium carbonate (TUMS - DOSED IN MG ELEMENTAL CALCIUM) 500 MG chewable tablet Chew 2 tablets by mouth daily as needed for indigestion or heartburn.    . Cholecalciferol (VITAMIN D3) 2000 units TABS Take 2,000 Units by mouth at bedtime.     . esomeprazole (NEXIUM) 20 MG  capsule Take 20 mg by mouth daily at 12 noon.    . KETOROLAC TROMETHAMINE OP Place 1 drop into the left eye 2 (two) times daily as needed (eye pain).    . losartan (COZAAR) 100 MG tablet Take 1 tablet (100 mg total) by mouth daily. (Patient taking differently: Take 50 mg by mouth daily. ) 90 tablet 0  . metoprolol tartrate (LOPRESSOR) 25 MG tablet     . pravastatin (PRAVACHOL) 40 MG tablet Take 1 tablet (40 mg total) by mouth daily. 90 tablet 4   No current facility-administered medications for this visit.     PHYSICAL EXAMINATION: ECOG PERFORMANCE STATUS: 1 - Symptomatic but completely ambulatory Vitals:   06/23/19 0854  BP: 127/82  Pulse: 69  Resp: 16  Temp: (!) 96.3 F (35.7 C)   Filed Weights   06/23/19 0854  Weight: 266 lb 3.2 oz (120.7 kg)    Physical Exam Constitutional:      Appearance: Normal appearance. She is obese.  HENT:     Head: Normocephalic.  Eyes:     General: No scleral icterus. Cardiovascular:     Rate and Rhythm: Normal rate.  Pulmonary:     Effort: Pulmonary effort is normal.  Abdominal:     General: There is no distension.     Palpations: Abdomen is soft.  Musculoskeletal:        General: Normal range of motion.     Cervical back: Normal range of motion.  Skin:    Coloration: Skin is not jaundiced.  Neurological:     General: No focal deficit present.     Mental Status: She is alert.  Psychiatric:        Mood and Affect: Mood normal.     LABORATORY DATA:  I have reviewed the data as listed Lab Results  Component Value Date   WBC 6.0 06/23/2019   HGB 11.7 (L) 06/23/2019   HCT 35.0 (L) 06/23/2019   MCV 83.5 06/23/2019   PLT 173 06/23/2019   Recent Labs    01/01/19 1501 01/01/19 1501 02/12/19 1044 03/12/19 1129 06/23/19 0833  NA 143   < > 140 140 142  K 4.1   < > 4.0 4.1 4.2  CL 108   < > 108 109 110  CO2 24   < > 27 28 24  GLUCOSE 143*   < > 110* 120* 130*  BUN 16   < > 15 18 19  CREATININE 1.49*   < > 1.40* 1.30* 1.42*      CALCIUM 10.1   < > 10.0 10.0 9.4  GFRNONAA 36*  --   --   --  38*  GFRAA 42*  --   --   --  44*  PROT 7.6  --  7.2  --  7.5  ALBUMIN 3.5  --  4.1  --  3.8  AST 41  --  15  --  15  ALT 33  --  10  --  11  ALKPHOS 57  --  73  --  84  BILITOT 1.2  --  0.7  --  0.8   < > = values in this interval not displayed.   Iron/TIBC/Ferritin/ %Sat    Component Value Date/Time   IRON 60 06/04/2019 1129   TIBC 294 06/04/2019 1129   FERRITIN 284 (H) 06/04/2019 1129   IRONPCTSAT 20 06/04/2019 1129      RADIOGRAPHIC STUDIES: I have personally reviewed the radiological images as listed and agreed with the findings in the report. No results found.     ASSESSMENT & PLAN:  1. Adrenal mass (HCC)   2. Normocytic anemia    #Adrenal mass, patient has establish care with urology Dr. Brandon who feels that the adrenal mass is likely adenoma.  No intervention is needed.  Continue surveillance.  MRI in 1 year. #Enlarged ovary, was seen by gynecology oncology.  Normal CA-125.  No intervention. #Anemia, Repeat CBC showed normocytic anemia, most likely secondary to chronic kidney disease. Iron panel was reviewed.  Ferritin 284.  Iron saturation 20.  Recommend to further increase iron store in the setting of CKD.  We also discussed the possibilities of IV iron in the future if needed. I suggest patient to take oral iron supplementation with ferrous sulfate 325 mg twice daily.  Suggest patient to take stool softener Colace 100 mg daily for constipation. Check multiple myeloma panel and light chain ratio at the next visit.  Orders Placed This Encounter  Procedures  . CBC with Differential/Platelet    Standing Status:   Future    Standing Expiration Date:   06/22/2020  . Comprehensive metabolic panel    Standing Status:   Future    Standing Expiration Date:   06/22/2020  . Kappa/lambda light chains    Standing Status:   Future    Standing Expiration Date:   06/22/2020  . Multiple Myeloma Panel (SPEP&IFE  w/QIG)    Standing Status:   Future    Standing Expiration Date:   06/22/2020    All questions were answered. The patient knows to call the clinic with any problems questions or concerns.  Return of visit: 3 months Zhou Yu, MD, PhD Hematology Oncology [facility] Pager- [phone] 06/23/2019 

## 2019-06-30 ENCOUNTER — Other Ambulatory Visit: Payer: Self-pay

## 2019-06-30 ENCOUNTER — Ambulatory Visit (INDEPENDENT_AMBULATORY_CARE_PROVIDER_SITE_OTHER): Payer: Medicare PPO | Admitting: Ophthalmology

## 2019-06-30 ENCOUNTER — Encounter (INDEPENDENT_AMBULATORY_CARE_PROVIDER_SITE_OTHER): Payer: Self-pay | Admitting: Ophthalmology

## 2019-06-30 DIAGNOSIS — H43811 Vitreous degeneration, right eye: Secondary | ICD-10-CM | POA: Diagnosis not present

## 2019-06-30 DIAGNOSIS — H35352 Cystoid macular degeneration, left eye: Secondary | ICD-10-CM | POA: Diagnosis not present

## 2019-06-30 DIAGNOSIS — Z9889 Other specified postprocedural states: Secondary | ICD-10-CM | POA: Diagnosis not present

## 2019-06-30 MED ORDER — KETOROLAC TROMETHAMINE 0.4 % OP SOLN: 1.0000 [drp] | Freq: Two times a day (BID) | OPHTHALMIC | 6 refills | Status: AC | PRN

## 2019-06-30 NOTE — Assessment & Plan Note (Signed)
Past, now resolved has been using intermittent ketorolac once or twice daily on a as needed basis for each eye which has a history of CME

## 2019-06-30 NOTE — Progress Notes (Signed)
06/30/2019     CHIEF COMPLAINT Patient presents for Retina Follow Up   HISTORY OF PRESENT ILLNESS: Jennifer Huff is a 69 y.o. female who presents to the clinic today for:   HPI    Retina Follow Up    Patient presents with  Other.  In both eyes.  This started 1 year ago.  Severity is mild.  Duration of 1 year.  Since onset it is stable.          Comments    1 Year F/U OU  Pt reports tearing OS off and on. Pt sts [facility] OU is stable.       Last edited by Rockie Neighbours, Amelia on 06/30/2019 10:03 AM. (History)      Referring physician: Burnard Hawthorne, FNP [address] [place],  [place] [address]  HISTORICAL INFORMATION:   Selected notes from the MEDICAL RECORD NUMBER    Lab Results  Component Value Date   HGBA1C 5.9 01/25/2018     CURRENT MEDICATIONS: Current Outpatient Medications (Ophthalmic Drugs)  Medication Sig  . KETOROLAC TROMETHAMINE OP Place 1 drop into the left eye 2 (two) times daily as needed (eye pain).   No current facility-administered medications for this visit. (Ophthalmic Drugs)   Current Outpatient Medications (Other)  Medication Sig  . acetaminophen (TYLENOL) 500 MG tablet Take 1,000 mg by mouth daily as needed for mild pain.  Marland Kitchen amLODipine (NORVASC) 10 MG tablet Take 1 tablet (10 mg total) by mouth daily.  . calcium carbonate (TUMS - DOSED IN MG ELEMENTAL CALCIUM) 500 MG chewable tablet Chew 2 tablets by mouth daily as needed for indigestion or heartburn.  . Cholecalciferol (VITAMIN D3) 2000 units TABS Take 2,000 Units by mouth at bedtime.   . docusate sodium (COLACE) 100 MG capsule Take 1 capsule (100 mg total) by mouth daily.  Marland Kitchen esomeprazole (NEXIUM) 20 MG capsule Take 20 mg by mouth daily at 12 noon.  . ferrous sulfate 325 (65 FE) MG EC tablet Take 1 tablet (325 mg total) by mouth 2 (two) times daily with a meal.  . losartan (COZAAR) 100 MG tablet Take 1 tablet (100 mg total) by mouth daily. (Patient taking differently:  Take 50 mg by mouth daily. )  . metoprolol tartrate (LOPRESSOR) 25 MG tablet   . pravastatin (PRAVACHOL) 40 MG tablet Take 1 tablet (40 mg total) by mouth daily.   No current facility-administered medications for this visit. (Other)      REVIEW OF SYSTEMS:    ALLERGIES Allergies  Allergen Reactions  . Ace Inhibitors Swelling    Lips swelled.  . Codeine Other (See Comments)    Jittery     PAST MEDICAL HISTORY Past Medical History:  Diagnosis Date  . Cataract   . GERD (gastroesophageal reflux disease)   . HTN (hypertension)   . Hypercholesteremia   . Vitamin D deficiency    Past Surgical History:  Procedure Laterality Date  . COLONOSCOPY WITH PROPOFOL N/A 05/06/2019   Procedure: COLONOSCOPY WITH PROPOFOL;  Surgeon: Virgel Manifold, MD;  Location: [facility] ENDOSCOPY;  Service: Endoscopy;  Laterality: N/A;  . KNEE ARTHROSCOPY WITH LATERAL MENISECTOMY Right 11/06/2017   Procedure: KNEE ARTHROSCOPY WITH REPAIR LATERAL MENISCAL ROOT TEAR;  Surgeon: Corky Mull, MD;  Location: [facility] ORS;  Service: Orthopedics;  Laterality: Right;  . LAPAROSCOPIC HYSTERECTOMY      FAMILY HISTORY Family History  Problem Relation Age of Onset  . Kidney disease Father   . Heart  disease Father        died from mi  . Cancer Father        prostate  . Hypertension Sister   . Colon polyps Sister   . Hypertension Brother   . Hypertension Sister   . Diabetes Mother   . Hypertension Mother   . Cancer Paternal Grandmother        head and neck  . Hypertension Brother   . Cancer Son   . Colon cancer Neg Hx   . Breast cancer Neg Hx     SOCIAL HISTORY Social History   Tobacco Use  . Smoking status: Former Smoker    Quit date: 12/07/1997    Years since quitting: 21.5  . Smokeless tobacco: Never Used  Vaping Use  . Vaping Use: Never used  Substance Use Topics  . Alcohol use: Not Currently  . Drug use: No         OPHTHALMIC EXAM:  Base Eye Exam    Visual Acuity (ETDRS)      Right  Left   Dist cc 20/20 20/20 -2   Correction: Glasses       Tonometry (Tonopen, 10:07 AM)      Right Left   Pressure 16 19       Pupils      Pupils Dark Light Shape React APD   Right PERRL 4 3 Round Brisk None   Left PERRL 4 3 Round Brisk None       Visual Fields (Counting fingers)      Left Right    Full Full       Extraocular Movement      Right Left    Full Full       Neuro/Psych    Oriented x3: Yes   Mood/Affect: Normal       Dilation    Both eyes: 1.0% Mydriacyl, 2.5% Phenylephrine @ 10:07 AM        Slit Lamp and Fundus Exam    External Exam      Right Left   External Normal Normal       Slit Lamp Exam      Right Left   Lids/Lashes Normal Normal   Conjunctiva/Sclera White and quiet White and quiet   Cornea Clear Clear   Anterior Chamber Deep and quiet Deep and quiet   Iris Round and reactive Round and reactive   Lens  Centered posterior chamber intraocular lens Clear   Anterior Vitreous Normal Normal       Fundus Exam      Right Left   Posterior Vitreous Normal Vitrectomized, clear   Disc Normal Normal   C/D Ratio 0.3 0.45   Macula Normal Normal   Vessels Normal Normal   Periphery Normal Normal          IMAGING AND PROCEDURES  Imaging and Procedures for 06/30/19  OCT, Retina - OU - Both Eyes       Right Eye Quality was good. Scan locations included subfoveal. Central Foveal Thickness: 268. Progression has been stable. Findings include normal observations.   Left Eye Quality was good. Scan locations included subfoveal. Central Foveal Thickness: 313. Progression has been stable. Findings include normal observations.                 ASSESSMENT/PLAN:  Cystoid macular edema of left eye Past, now resolved has been using intermittent ketorolac once or twice daily on a as needed basis for each eye which has a history  of CME      ICD-10-CM   1. Posterior vitreous detachment of right eye  H43.811 OCT, Retina - OU - Both Eyes  2.  Cystoid macular edema of left eye  H35.352 OCT, Retina - OU - Both Eyes  3. History of vitrectomy  Z98.890 OCT, Retina - OU - Both Eyes    1.  OU with a history of cystoid macular edema and vitreal macular traction.  OS status post vitrectomy, CME has resolved OU.  2.  Continue topical nonsteroidal ketorolac OS as needed once or twice daily actually uses, but ran out of it 2 months ago  3.  Ophthalmic Meds Ordered this visit:  No orders of the defined types were placed in this encounter.      Return in about 1 year (around 06/29/2020) for DILATE OU, OCT.  There are no Patient Instructions on file for this visit.   Explained the diagnoses, plan, and follow up with the patient and they expressed understanding.  Patient expressed understanding of the importance of proper follow up care.   Clent Demark Armond Cuthrell M.D. Diseases & Surgery of the Retina and Vitreous [facility] [place] 06/30/19     Abbreviations: M myopia (nearsighted); A astigmatism; H hyperopia (farsighted); P presbyopia; Mrx spectacle prescription;  CTL contact lenses; OD right eye; OS left eye; OU both eyes  XT exotropia; ET esotropia; PEK punctate epithelial keratitis; PEE punctate epithelial erosions; DES dry eye syndrome; MGD meibomian gland dysfunction; ATs artificial tears; PFAT's preservative free artificial tears; [place] nuclear sclerotic cataract; PSC posterior subcapsular cataract; ERM epi-retinal membrane; PVD posterior vitreous detachment; RD retinal detachment; DM diabetes mellitus; DR diabetic retinopathy; NPDR non-proliferative diabetic retinopathy; PDR proliferative diabetic retinopathy; CSME clinically significant macular edema; DME diabetic macular edema; dbh dot blot hemorrhages; CWS cotton wool spot; POAG primary open angle glaucoma; C/D cup-to-disc ratio; HVF humphrey visual field; GVF goldmann visual field; OCT optical coherence tomography; IOP intraocular pressure; BRVO Branch retinal vein occlusion;  CRVO central retinal vein occlusion; CRAO central retinal artery occlusion; BRAO branch retinal artery occlusion; RT retinal tear; SB scleral buckle; PPV pars plana vitrectomy; VH Vitreous hemorrhage; PRP panretinal laser photocoagulation; IVK intravitreal kenalog; VMT vitreomacular traction; MH Macular hole;  NVD neovascularization of the disc; NVE neovascularization elsewhere; AREDS age related eye disease study; ARMD age related macular degeneration; POAG primary open angle glaucoma; EBMD epithelial/anterior basement membrane dystrophy; ACIOL anterior chamber intraocular lens; IOL intraocular lens; PCIOL posterior chamber intraocular lens; Phaco/IOL phacoemulsification with intraocular lens placement; [place] photorefractive keratectomy; LASIK laser assisted in situ keratomileusis; HTN hypertension; DM diabetes mellitus; COPD chronic obstructive pulmonary disease

## 2019-07-22 ENCOUNTER — Other Ambulatory Visit: Payer: Self-pay | Admitting: Family

## 2019-07-22 DIAGNOSIS — Z1231 Encounter for screening mammogram for malignant neoplasm of breast: Secondary | ICD-10-CM

## 2019-08-13 ENCOUNTER — Encounter: Payer: Self-pay | Admitting: Internal Medicine

## 2019-08-13 ENCOUNTER — Other Ambulatory Visit: Payer: Self-pay

## 2019-08-13 ENCOUNTER — Ambulatory Visit (INDEPENDENT_AMBULATORY_CARE_PROVIDER_SITE_OTHER): Payer: Medicare PPO | Admitting: Internal Medicine

## 2019-08-13 VITALS — BP 124/82 | HR 74 | Temp 98.2°F | Ht 66.0 in | Wt 265.0 lb

## 2019-08-13 DIAGNOSIS — K649 Unspecified hemorrhoids: Secondary | ICD-10-CM

## 2019-08-13 DIAGNOSIS — K5790 Diverticulosis of intestine, part unspecified, without perforation or abscess without bleeding: Secondary | ICD-10-CM | POA: Diagnosis not present

## 2019-08-13 DIAGNOSIS — K5900 Constipation, unspecified: Secondary | ICD-10-CM | POA: Diagnosis not present

## 2019-08-13 DIAGNOSIS — K921 Melena: Secondary | ICD-10-CM | POA: Diagnosis not present

## 2019-08-13 LAB — HEMOCCULT GUIAC POC 1CARD (OFFICE): Fecal Occult Blood, POC: POSITIVE — AB

## 2019-08-13 NOTE — Progress Notes (Signed)
Patient presenting with bleeding hemorrhoids. States these are painful at 6/10 pain. Onset of this past Monday and states pain was worse then.

## 2019-08-13 NOTE — Patient Instructions (Addendum)
Senna(laxative) + colace (stool softner) over the counter as needed  Increased colace 200 mg daily as needed  Or  Miralax laxative in 8 ounces of liquid   More water, more fruits, vegetables  Or add benefiber or metamucil (daily as needed) stir in 8 ounces liquid   Constipation, Adult Constipation is when a person has fewer bowel movements in a week than normal, has difficulty having a bowel movement, or has stools that are dry, hard, or larger than normal. Constipation may be caused by an underlying condition. It may become worse with age if a person takes certain medicines and does not take in enough fluids. Follow these instructions at home: Eating and drinking   Eat foods that have a lot of fiber, such as fresh fruits and vegetables, whole grains, and beans.  Limit foods that are high in fat, low in fiber, or overly processed, such as french fries, hamburgers, cookies, candies, and soda.  Drink enough fluid to keep your urine clear or pale yellow. General instructions  Exercise regularly or as told by your health care provider.  Go to the restroom when you have the urge to go. Do not hold it in.  Take over-the-counter and prescription medicines only as told by your health care provider. These include any fiber supplements.  Practice pelvic floor retraining exercises, such as deep breathing while relaxing the lower abdomen and pelvic floor relaxation during bowel movements.  Watch your condition for any changes.  Keep all follow-up visits as told by your health care provider. This is important. Contact a health care provider if:  You have pain that gets worse.  You have a fever.  You do not have a bowel movement after 4 days.  You vomit.  You are not hungry.  You lose weight.  You are bleeding from the anus.  You have thin, pencil-like stools. Get help right away if:  You have a fever and your symptoms suddenly get worse.  You leak stool or have blood in your  stool.  Your abdomen is bloated.  You have severe pain in your abdomen.  You feel dizzy or you faint. This information is not intended to replace advice given to you by your health care provider. Make sure you discuss any questions you have with your health care provider. Document Revised: 12/01/2016 Document Reviewed: 06/09/2015 Elsevier Patient Education  2020 [employer].   Hemorrhoids Hemorrhoids are swollen veins in and around the rectum or anus. There are two types of hemorrhoids:  Internal hemorrhoids. These occur in the veins that are just inside the rectum. They may poke through to the outside and become irritated and painful.  External hemorrhoids. These occur in the veins that are outside the anus and can be felt as a painful swelling or hard lump near the anus. Most hemorrhoids do not cause serious problems, and they can be managed with home treatments such as diet and lifestyle changes. If home treatments do not help the symptoms, procedures can be done to shrink or remove the hemorrhoids. What are the causes? This condition is caused by increased pressure in the anal area. This pressure may result from various things, including:  Constipation.  Straining to have a bowel movement.  Diarrhea.  Pregnancy.  Obesity.  Sitting for long periods of time.  Heavy lifting or other activity that causes you to strain.  Anal sex.  Riding a bike for a long period of time. What are the signs or symptoms? Symptoms of this  condition include:  Pain.  Anal itching or irritation.  Rectal bleeding.  Leakage of stool (feces).  Anal swelling.  One or more lumps around the anus. How is this diagnosed? This condition can often be diagnosed through a visual exam. Other exams or tests may also be done, such as:  An exam that involves feeling the rectal area with a gloved hand (digital rectal exam).  An exam of the anal canal that is done using a small tube  (anoscope).  A blood test, if you have lost a significant amount of blood.  A test to look inside the colon using a flexible tube with a camera on the end (sigmoidoscopy or colonoscopy). How is this treated? This condition can usually be treated at home. However, various procedures may be done if dietary changes, lifestyle changes, and other home treatments do not help your symptoms. These procedures can help make the hemorrhoids smaller or remove them completely. Some of these procedures involve surgery, and others do not. Common procedures include:  Rubber band ligation. Rubber bands are placed at the base of the hemorrhoids to cut off their blood supply.  Sclerotherapy. Medicine is injected into the hemorrhoids to shrink them.  Infrared coagulation. A type of light energy is used to get rid of the hemorrhoids.  Hemorrhoidectomy surgery. The hemorrhoids are surgically removed, and the veins that supply them are tied off.  Stapled hemorrhoidopexy surgery. The surgeon staples the base of the hemorrhoid to the rectal wall. Follow these instructions at home: Eating and drinking   Eat foods that have a lot of fiber in them, such as whole grains, beans, nuts, fruits, and vegetables.  Ask your health care provider about taking products that have added fiber (fiber supplements).  Reduce the amount of fat in your diet. You can do this by eating low-fat dairy products, eating less red meat, and avoiding processed foods.  Drink enough fluid to keep your urine pale yellow. Managing pain and swelling   Take warm sitz baths for 20 minutes, 3-4 times a day to ease pain and discomfort. You may do this in a bathtub or using a portable sitz bath that fits over the toilet.  If directed, apply ice to the affected area. Using ice packs between sitz baths may be helpful. ? Put ice in a plastic bag. ? Place a towel between your skin and the bag. ? Leave the ice on for 20 minutes, 2-3 times a  day. General instructions  Take over-the-counter and prescription medicines only as told by your health care provider.  Use medicated creams or suppositories as told.  Get regular exercise. Ask your health care provider how much and what kind of exercise is best for you. In general, you should do moderate exercise for at least 30 minutes on most days of the week (150 minutes each week). This can include activities such as walking, biking, or yoga.  Go to the bathroom when you have the urge to have a bowel movement. Do not wait.  Avoid straining to have bowel movements.  Keep the anal area dry and clean. Use wet toilet paper or moist towelettes after a bowel movement.  Do not sit on the toilet for long periods of time. This increases blood pooling and pain.  Keep all follow-up visits as told by your health care provider. This is important. Contact a health care provider if you have:  Increasing pain and swelling that are not controlled by treatment or medicine.  Difficulty having  a bowel movement, or you are unable to have a bowel movement.  Pain or inflammation outside the area of the hemorrhoids. Get help right away if you have:  Uncontrolled bleeding from your rectum. Summary  Hemorrhoids are swollen veins in and around the rectum or anus.  Most hemorrhoids can be managed with home treatments such as diet and lifestyle changes.  Taking warm sitz baths can help ease pain and discomfort.  In severe cases, procedures or surgery can be done to shrink or remove the hemorrhoids. This information is not intended to replace advice given to you by your health care provider. Make sure you discuss any questions you have with your health care provider. Document Revised: 05/17/2018 Document Reviewed: 05/10/2017 Elsevier Patient Education  [place].

## 2019-08-13 NOTE — Progress Notes (Signed)
Chief Complaint  Patient presents with  . Hemorrhoids    bleeding   F/u  1. H/o constipation Sunday with ab pain nausea and vomited bile with noticeable blood in toilet Monday all and and some this am red blood but was straining with constipation. 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   Cardiovascular: Negative for chest pain.  Gastrointestinal: Positive for abdominal pain, blood in stool, constipation, nausea and vomiting.   Past Medical History:  Diagnosis Date  . Cataract   . GERD (gastroesophageal reflux disease)   . HTN (hypertension)   . Hypercholesteremia   . Vitamin D deficiency    Past Surgical History:  Procedure Laterality Date  . COLONOSCOPY WITH PROPOFOL N/A 05/06/2019   Procedure: COLONOSCOPY WITH PROPOFOL;  Surgeon: Virgel Manifold, MD;  Location: [facility] ENDOSCOPY;  Service: Endoscopy;  Laterality: N/A;  . KNEE ARTHROSCOPY WITH LATERAL MENISECTOMY Right 11/06/2017   Procedure: KNEE ARTHROSCOPY WITH REPAIR LATERAL MENISCAL ROOT TEAR;  Surgeon: Corky Mull, MD;  Location: [facility] ORS;  Service: Orthopedics;  Laterality: Right;  . LAPAROSCOPIC HYSTERECTOMY     Family History  Problem Relation Age of Onset  . Kidney disease Father   . Heart disease Father        died from mi  . Cancer Father        prostate  . Hypertension Sister   . Colon polyps Sister   . Hypertension Brother   . Hypertension Sister   . Diabetes Mother   . Hypertension Mother   . Cancer Paternal Grandmother        head and neck  . Hypertension Brother   . Cancer Son   . Colon cancer Neg Hx   . Breast cancer Neg Hx    Social History   Socioeconomic History  . Marital status: Married    Spouse name: Not on file  . Number of children: 3  . Years of education: Not on file  . Highest education level: Not on file   Occupational History  . 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 Smoking status: Former Smoker    Quit date: 12/07/1997    Years since quitting: 21.6  . Smokeless tobacco: Never Used  Vaping Use  . Vaping Use: Never used  Substance and Sexual Activity  . Alcohol use: Not Currently  . Drug use: No  . Sexual activity: Not Currently  Other Topics Concern  . Not on file  Social History Narrative   Regular Exercise -  NO   Daily Caffeine Use:  2-3 soda/sw tea      Retired   Scientist, physiological Strain: Low Risk   . Difficulty of Paying Living Expenses: Not hard at all  Food Insecurity: No Food Insecurity  . Worried About Charity fundraiser in the Last Year: Never true  . Ran Out of Food in the Last Year: Never true  Transportation Needs: No Transportation Needs  . Lack of Transportation (Medical): No  . Lack of Transportation (Non-Medical): No  Physical Activity: Unknown  . Days of Exercise per Week: 0 days  . Minutes of Exercise per Session: Not on file  Stress: No Stress Concern Present  . Feeling of Stress : Not at all  Social  Connections:   . Frequency of Communication with Friends and Family:   . Frequency of Social Gatherings with Friends and Family:   . Attends Religious Services:   . Active Member of Clubs or Organizations:   . Attends Archivist Meetings:   Marland Kitchen Marital Status:   Intimate Partner Violence: Not At Risk  . Fear of Current or Ex-Partner: No  . Emotionally Abused: No  . Physically Abused: No  . Sexually Abused: No   Current Meds  Medication Sig  . acetaminophen (TYLENOL) 500 MG tablet Take 1,000 mg by mouth daily as needed for mild pain.  Marland Kitchen amLODipine (NORVASC) 10 MG tablet Take 1 tablet (10 mg total) by mouth daily.  . calcium carbonate (TUMS - DOSED IN MG ELEMENTAL CALCIUM) 500 MG chewable tablet Chew 2 tablets by mouth daily as needed for indigestion or heartburn.  . Cholecalciferol  (VITAMIN D3) 2000 units TABS Take 2,000 Units by mouth at bedtime.   . docusate sodium (COLACE) 100 MG capsule Take 1 capsule (100 mg total) by mouth daily.  Marland Kitchen esomeprazole (NEXIUM) 20 MG capsule Take 20 mg by mouth daily at 12 noon.  . ferrous sulfate 325 (65 FE) MG EC tablet Take 1 tablet (325 mg total) by mouth 2 (two) times daily with a meal.  . ketorolac (ACULAR) 0.4 % SOLN Place 1 drop into the left eye 2 (two) times daily as needed (eye pain).  Marland Kitchen losartan (COZAAR) 100 MG tablet Take 1 tablet (100 mg total) by mouth daily. (Patient taking differently: Take 50 mg by mouth daily. )  . metoprolol tartrate (LOPRESSOR) 25 MG tablet   . pravastatin (PRAVACHOL) 40 MG tablet Take 1 tablet (40 mg total) by mouth daily.   Allergies  Allergen Reactions  . Ace Inhibitors Swelling    Lips swelled.  . Codeine Other (See Comments)    Jittery    Recent Results (from the past 2160 hour(s))  Iron and TIBC     Status: None   Collection Time: 06/04/19 11:29 AM  Result Value Ref Range   Total Iron Binding Capacity 294 250 - 450 ug/dL   UIBC 234 118 - 369 ug/dL   Iron 60 27 - 139 ug/dL   Iron Saturation 20 15 - 55 %  Ferritin     Status: Abnormal   Collection Time: 06/04/19 11:29 AM  Result Value Ref Range   Ferritin 284 (H) 15.0 - 150.0 ng/mL  Technologist smear review     Status: None   Collection Time: 06/23/19  8:31 AM  Result Value Ref Range   WBC Morphology MORPHOLOGY UNREMARKABLE    RBC Morphology MORPHOLOGY UNREMARKABLE    Tech Review PLATELETS APPEAR ADEQUATE     Comment: FEW GIENT PLATELETS Performed at [facility], [address] [place], [place] [address]   Comprehensive metabolic panel     Status: Abnormal   Collection Time: 06/23/19  8:33 AM  Result Value Ref Range   Sodium 142 135 - 145 mmol/L   Potassium 4.2 3.5 - 5.1 mmol/L   Chloride 110 98 - 111 mmol/L   CO2 24 22 - 32 mmol/L   Glucose, Bld 130 (H) 70 - 99 mg/dL    Comment: Glucose reference range applies only  to samples taken after fasting for at least 8 hours.   BUN 19 8 - 23 mg/dL   Creatinine, Ser 1.42 (H) 0.44 - 1.00 mg/dL   Calcium 9.4 8.9 - 10.3 mg/dL   Total Protein  7.5 6.5 - 8.1 g/dL   Albumin 3.8 3.5 - 5.0 g/dL   AST 15 15 - 41 U/L   ALT 11 0 - 44 U/L   Alkaline Phosphatase 84 38 - 126 U/L   Total Bilirubin 0.8 0.3 - 1.2 mg/dL   GFR calc non Af Amer 38 (L) >60 mL/min   GFR calc Af Amer 44 (L) >60 mL/min   Anion gap 8 5 - 15    Comment: Performed at [facility], [address] [place], [place] [address]  Retic Panel     Status: None   Collection Time: 06/23/19  8:33 AM  Result Value Ref Range   Retic Ct Pct 1.7 0.4 - 3.1 %   RBC. 4.23 3.87 - 5.11 MIL/uL   Retic Count, Absolute 70.6 19.0 - 186.0 K/uL   Immature Retic Fract 11.8 2.3 - 15.9 %   Reticulocyte Hemoglobin 31.4 >27.9 pg    Comment: Performed at [facility], [address] [place], [place] [address]  CBC with Differential/Platelet     Status: Abnormal   Collection Time: 06/23/19  8:33 AM  Result Value Ref Range   WBC 6.0 4.0 - 10.5 K/uL   RBC 4.19 3.87 - 5.11 MIL/uL   Hemoglobin 11.7 (L) 12.0 - 15.0 g/dL   HCT 35.0 (L) 36 - 46 %   MCV 83.5 80.0 - 100.0 fL   MCH 27.9 26.0 - 34.0 pg   MCHC 33.4 30.0 - 36.0 g/dL   RDW 14.3 11.5 - 15.5 %   Platelets 173 150 - 400 K/uL   nRBC 0.0 0.0 - 0.2 %   Neutrophils Relative % 55 %   Neutro Abs 3.4 1.7 - 7.7 K/uL   Lymphocytes Relative 29 %   Lymphs Abs 1.7 0.7 - 4.0 K/uL   Monocytes Relative 14 %   Monocytes Absolute 0.8 0 - 1 K/uL   Eosinophils Relative 1 %   Eosinophils Absolute 0.1 0 - 0 K/uL   Basophils Relative 0 %   Basophils Absolute 0.0 0 - 0 K/uL   Immature Granulocytes 1 %   Abs Immature Granulocytes 0.03 0.00 - 0.07 K/uL    Comment: Performed at [facility], [address] [place], [place] [address]  POCT occult blood stool     Status: Abnormal   Collection Time: 08/13/19  1:39 PM  Result Value Ref Range   Fecal Occult Blood, POC  Positive (A) Negative   Card #1 Date     Card #2 Fecal Occult Blod, POC     Card #2 Date     Card #3 Fecal Occult Blood, POC     Card #3 Date     Objective  Body mass index is 42.77 kg/m. Wt Readings from Last 3 Encounters:  08/13/19 265 lb (120.2 kg)  06/23/19 266 lb 3.2 oz (120.7 kg)  05/21/19 262 lb 3.2 oz (118.9 kg)   Temp Readings from Last 3 Encounters:  08/13/19 98.2 F (36.8 C) (Oral)  06/23/19 (!) 96.3 F (35.7 C)  05/21/19 98 F (36.7 C) (Oral)   BP Readings from Last 3 Encounters:  08/13/19 124/82  06/23/19 127/82  05/21/19 128/75   Pulse Readings from Last 3 Encounters:  08/13/19 74  06/23/19 69  05/21/19 88    Physical Exam Vitals and nursing note reviewed.  Constitutional:      Appearance: Normal appearance. She is well-developed and well-groomed. She is morbidly obese.  HENT:     Head:  Normocephalic and atraumatic.  Eyes:     Conjunctiva/sclera: Conjunctivae normal.     Pupils: Pupils are equal, round, and reactive to light.  Cardiovascular:     Rate and Rhythm: Normal rate and regular rhythm.     Heart sounds: Normal heart sounds. No murmur heard.   Genitourinary:    Rectum: Guaiac result positive. External hemorrhoid present.  Skin:    General: Skin is warm and dry.  Neurological:     General: No focal deficit present.     Mental Status: She is alert and oriented to person, place, and time. Mental status is at baseline.  Psychiatric:        Attention and Perception: Attention and perception normal.        Mood and Affect: Mood and affect normal.        Speech: Speech normal.        Behavior: Behavior normal. Behavior is cooperative.        Thought Content: Thought content normal.        Cognition and Memory: Cognition and memory normal.        Judgment: Judgment normal.     Assessment  Plan  Blood in stool ? Etiology hemorrhoids vs diverticular - Plan: POCT occult blood stool Constipation, unspecified constipation  type Diverticulosis Hemorrhoids, unspecified hemorrhoid type  Sent message Gi Dr. Darene Lamer to f/u  Given info hemorrhoids and constipation  Senna(laxative) + colace (stool softner) over the counter as needed  Increased colace 200 mg daily as needed  Or  Miralax laxative in 8 ounces of liquid   More water, more fruits, vegetables  Or add benefiber or metamucil (daily as needed) stir in 8 ounces liquid    Provider: Dr. Olivia Mackie McLean-Scocuzza-[facility]

## 2019-08-19 ENCOUNTER — Other Ambulatory Visit: Payer: Self-pay | Admitting: *Deleted

## 2019-08-19 MED ORDER — FERROUS SULFATE 325 (65 FE) MG PO TBEC: 325.0000 mg | DELAYED_RELEASE_TABLET | Freq: Two times a day (BID) | ORAL | 2 refills | Status: DC

## 2019-08-21 ENCOUNTER — Encounter: Payer: Self-pay | Admitting: Gastroenterology

## 2019-08-21 ENCOUNTER — Ambulatory Visit (INDEPENDENT_AMBULATORY_CARE_PROVIDER_SITE_OTHER): Payer: Medicare PPO | Admitting: Gastroenterology

## 2019-08-21 ENCOUNTER — Other Ambulatory Visit: Payer: Self-pay

## 2019-08-21 VITALS — BP 136/84 | HR 80 | Temp 98.3°F | Ht 66.0 in | Wt 268.0 lb

## 2019-08-21 DIAGNOSIS — K625 Hemorrhage of anus and rectum: Secondary | ICD-10-CM

## 2019-08-21 NOTE — Patient Instructions (Signed)
Please take Miralax 17 Grams daily.   Polyethylene Glycol powder What is this medicine? POLYETHYLENE GLYCOL 3350 (pol ee ETH i leen; GLYE col) powder is a laxative used to treat constipation. It increases the amount of water in the stool. Bowel movements become easier and more frequent. This medicine may be used for other purposes; ask your health care provider or pharmacist if you have questions. COMMON BRAND NAME(S): Sharlyn Bologna, GlycoLax, Healthylax, MiraLax, Smooth LAX, Vita Health What should I tell my health care provider before I take this medicine? They need to know if you have any of these conditions:  a history of blockage of the stomach or intestine  current abdomen distension or pain  difficulty swallowing  diverticulitis, ulcerative colitis, or other chronic bowel disease  phenylketonuria  an unusual or allergic reaction to polyethylene glycol, other medicines, dyes, or preservatives  pregnant or trying to get pregnant  breast-feeding How should I use this medicine? Take this medicine by mouth. The bottle has a measuring cap that is marked with a line. Pour the powder into the cap up to the marked line (the dose is about 1 heaping tablespoon). Add the powder in the cap to a full glass (4 to 8 ounces or 120 to 240 mL) of water, juice, soda, coffee or tea. Mix the powder well. Ensure that the powder is fully dissolved. Do not drink if there are any clumps. Drink the solution. Take exactly as directed. Do not take your medicine more often than directed. Talk to your pediatrician regarding the use of this medicine in children. Special care may be needed. Overdosage: If you think you have taken too much of this medicine contact a poison control center or emergency room at once. NOTE: This medicine is only for you. Do not share this medicine with others. What if I miss a dose? If you miss a dose, take it as soon as you can. If it is almost time for your next dose, take only  that dose. Do not take double or extra doses. What may interact with this medicine? Interactions are not expected. This list may not describe all possible interactions. Give your health care provider a list of all the medicines, herbs, non-prescription drugs, or dietary supplements you use. Also tell them if you smoke, drink alcohol, or use illegal drugs. Some items may interact with your medicine. What should I watch for while using this medicine? Do not use for more than 2 weeks without advice from your doctor or health care professional. It can take 2 to 4 days to have a bowel movement and to experience improvement in constipation. See your health care professional for any changes in bowel habits, including constipation, that are severe or last longer than three weeks. Always take this medicine with plenty of water. What side effects may I notice from receiving this medicine? Side effects that you should report to your doctor or health care professional as soon as possible:  diarrhea  difficulty breathing  itching of the skin, hives, or skin rash  severe bloating, pain, or distension of the stomach  vomiting Side effects that usually do not require medical attention (report to your doctor or health care professional if they continue or are bothersome):  bloating or gas  lower abdominal discomfort or cramps  nausea This list may not describe all possible side effects. Call your doctor for medical advice about side effects. You may report side effects to FDA at 1-800-FDA-1088. Where should I keep my  medicine? Keep out of the reach of children. Store between 15 and 30 degrees C (59 and 86 degrees F). Throw away any unused medicine after the expiration date. NOTE: This sheet is a summary. It may not cover all possible information. If you have questions about this medicine, talk to your doctor, pharmacist, or health care provider.  2020 Elsevier/Gold Standard (2017-06-07  10:42:01) High-Fiber Diet Fiber, also called dietary fiber, is a type of carbohydrate that is found in fruits, vegetables, whole grains, and beans. A high-fiber diet can have many health benefits. Your health care provider may recommend a high-fiber diet to help:  Prevent constipation. Fiber can make your bowel movements more regular.  Lower your cholesterol.  Relieve the following conditions: ? Swelling of veins in the anus (hemorrhoids). ? Swelling and irritation (inflammation) of specific areas of the digestive tract (uncomplicated diverticulosis). ? A problem of the large intestine (colon) that sometimes causes pain and diarrhea (irritable bowel syndrome, IBS).  Prevent overeating as part of a weight-loss plan.  Prevent heart disease, type 2 diabetes, and certain cancers. What is my plan? The recommended daily fiber intake in grams (g) includes:  38 g for men age 23 or younger.  30 g for men over age 52.  36 g for women age 33 or younger.  21 g for women over age 25. You can get the recommended daily intake of dietary fiber by:  Eating a variety of fruits, vegetables, grains, and beans.  Taking a fiber supplement, if it is not possible to get enough fiber through your diet. What do I need to know about a high-fiber diet?  It is better to get fiber through food sources rather than from fiber supplements. There is not a lot of research about how effective supplements are.  Always check the fiber content on the nutrition facts label of any prepackaged food. Look for foods that contain 5 g of fiber or more per serving.  Talk with a diet and nutrition specialist (dietitian) if you have questions about specific foods that are recommended or not recommended for your medical condition, especially if those foods are not listed below.  Gradually increase how much fiber you consume. If you increase your intake of dietary fiber too quickly, you may have bloating, cramping, or  gas.  Drink plenty of water. Water helps you to digest fiber. What are tips for following this plan?  Eat a wide variety of high-fiber foods.  Make sure that half of the grains that you eat each day are whole grains.  Eat breads and cereals that are made with whole-grain flour instead of refined flour or white flour.  Eat brown rice, bulgur wheat, or millet instead of white rice.  Start the day with a breakfast that is high in fiber, such as a cereal that contains 5 g of fiber or more per serving.  Use beans in place of meat in soups, salads, and pasta dishes.  Eat high-fiber snacks, such as berries, raw vegetables, nuts, and popcorn.  Choose whole fruits and vegetables instead of processed forms like juice or sauce. What foods can I eat?  Fruits Berries. Pears. Apples. Oranges. Avocado. Prunes and raisins. Dried figs. Vegetables Sweet potatoes. Spinach. Kale. Artichokes. Cabbage. Broccoli. Cauliflower. Green peas. Carrots. Squash. Grains Whole-grain breads. Multigrain cereal. Oats and oatmeal. Brown rice. Barley. Bulgur wheat. [place]. Quinoa. Bran muffins. Popcorn. Rye wafer crackers. Meats and other proteins Navy, kidney, and pinto beans. Soybeans. Split peas. Lentils. Nuts and seeds. Dairy  Fiber-fortified yogurt. Beverages Fiber-fortified soy milk. Fiber-fortified orange juice. Other foods Fiber bars. The items listed above may not be a complete list of recommended foods and beverages. Contact a dietitian for more options. What foods are not recommended? Fruits Fruit juice. Cooked, strained fruit. Vegetables Fried potatoes. Canned vegetables. Well-cooked vegetables. Grains White bread. Pasta made with refined flour. White rice. Meats and other proteins Fatty cuts of meat. Fried chicken or fried fish. Dairy Milk. Yogurt. Cream cheese. Sour cream. Fats and oils Butters. Beverages Soft drinks. Other foods Cakes and pastries. The items listed above may not be a  complete list of foods and beverages to avoid. Contact a dietitian for more information. Summary  Fiber is a type of carbohydrate. It is found in fruits, vegetables, whole grains, and beans.  There are many health benefits of eating a high-fiber diet, such as preventing constipation, lowering blood cholesterol, helping with weight loss, and reducing your risk of heart disease, diabetes, and certain cancers.  Gradually increase your intake of fiber. Increasing too fast can result in cramping, bloating, and gas. Drink plenty of water while you increase your fiber.  The best sources of fiber include whole fruits and vegetables, whole grains, nuts, seeds, and beans. This information is not intended to replace advice given to you by your health care provider. Make sure you discuss any questions you have with your health care provider. Document Revised: 10/23/2016 Document Reviewed: 10/23/2016 Elsevier Patient Education  2020 [employer].

## 2019-08-22 NOTE — Progress Notes (Signed)
[name], MD [address]  [place]  [place], [place] [address]  Main: [phone]  Fax: [phone]   Primary Care Physician: Burnard Hawthorne, FNP   Chief Complaint  Patient presents with  . Follow-up    blood in stool    HPI: Jennifer Huff is a 69 y.o. female here she reported rectal blood per rectum to her PCP.  Does report straining and constipation recently.  Has had recent colonoscopies.  See previous reports.  Last episode was 2 days ago.  No abdominal pain.  No nausea or vomiting.  No melena.  No NSAID use.  Current Outpatient Medications  Medication Sig Dispense Refill  . acetaminophen (TYLENOL) 500 MG tablet Take 1,000 mg by mouth daily as needed for mild pain.    Marland Kitchen amLODipine (NORVASC) 10 MG tablet Take 1 tablet (10 mg total) by mouth daily. (Patient taking differently: Take 50 mg by mouth daily. ) 90 tablet 4  . calcium carbonate (TUMS - DOSED IN MG ELEMENTAL CALCIUM) 500 MG chewable tablet Chew 2 tablets by mouth daily as needed for indigestion or heartburn.    . Cholecalciferol (VITAMIN D3) 2000 units TABS Take 2,000 Units by mouth at bedtime.     . docusate sodium (COLACE) 100 MG capsule Take 1 capsule (100 mg total) by mouth daily. 90 capsule 0  . esomeprazole (NEXIUM) 20 MG capsule Take 20 mg by mouth as needed.     . ferrous sulfate 325 (65 FE) MG EC tablet Take 1 tablet (325 mg total) by mouth 2 (two) times daily with a meal. 60 tablet 2  . ketorolac (ACULAR) 0.4 % SOLN Place 1 drop into the left eye 2 (two) times daily as needed (eye pain). 5 mL 6  . losartan (COZAAR) 100 MG tablet Take 1 tablet (100 mg total) by mouth daily. (Patient taking differently: Take 50 mg by mouth daily. ) 90 tablet 0  . metoprolol tartrate (LOPRESSOR) 25 MG tablet     . pravastatin (PRAVACHOL) 40 MG tablet Take 1 tablet (40 mg total) by mouth daily. 90 tablet 4   No current facility-administered medications for this visit.    Allergies as of  08/21/2019 - Review Complete 08/21/2019  Allergen Reaction Noted  . Ace inhibitors Swelling 12/08/2010  . Codeine Other (See Comments) 12/08/2010    ROS:  General: Negative for anorexia, weight loss, fever, chills, fatigue, weakness. ENT: Negative for hoarseness, difficulty swallowing , nasal congestion. CV: Negative for chest pain, angina, palpitations, dyspnea on exertion, peripheral edema.  Respiratory: Negative for dyspnea at rest, dyspnea on exertion, cough, sputum, wheezing.  GI: See history of present illness. GU:  Negative for dysuria, hematuria, urinary incontinence, urinary frequency, nocturnal urination.  Endo: Negative for unusual weight change.    Physical Examination:   BP 136/84   Pulse 80   Temp 98.3 F (36.8 C) (Oral)   Ht 5\' 6"  (1.676 m)   Wt 268 lb (121.6 kg)   BMI 43.26 kg/m   General: Well-nourished, well-developed in no acute distress.  Eyes: No icterus. Conjunctivae pink. Mouth: Oropharyngeal mucosa moist and pink , no lesions erythema or exudate. Neck: Supple, Trachea midline Abdomen: Bowel sounds are normal, nontender, nondistended, no hepatosplenomegaly or masses, no abdominal bruits or hernia , no rebound or guarding.   Rectal exam: No perianal lesions, digital rectal exam with green stool, possible small internal hemorrhoid noted, nonbleeding Extremities: No lower extremity edema. No clubbing or deformities. Neuro: Alert and oriented  x 3.  Grossly intact. Skin: Warm and dry, no jaundice.   Psych: Alert and cooperative, normal mood and affect.   Labs: CMP     Component Value Date/Time   NA 142 06/23/2019 0833   K 4.2 06/23/2019 0833   CL 110 06/23/2019 0833   CO2 24 06/23/2019 0833   GLUCOSE 130 (H) 06/23/2019 0833   BUN 19 06/23/2019 0833   BUN 13 05/25/2010 0000   CREATININE 1.42 (H) 06/23/2019 0833   CALCIUM 9.4 06/23/2019 0833   PROT 7.5 06/23/2019 0833   ALBUMIN 3.8 06/23/2019 0833   AST 15 06/23/2019 0833   ALT 11 06/23/2019 0833    ALKPHOS 84 06/23/2019 0833   BILITOT 0.8 06/23/2019 0833   GFRNONAA 38 (L) 06/23/2019 0833   GFRAA 44 (L) 06/23/2019 0833   Lab Results  Component Value Date   WBC 6.0 06/23/2019   HGB 11.7 (L) 06/23/2019   HCT 35.0 (L) 06/23/2019   MCV 83.5 06/23/2019   PLT 173 06/23/2019    Imaging Studies: No results found.  Assessment and Plan:   Jennifer Huff is a 69 y.o. y/o female here reporting bright red blood per rectum that started 1 week ago, associated with constipation  High-fiber diet MiraLAX daily with goal of 1-2 soft bowel movements daily.  If not at goal, patient instructed to increase dose to twice daily.  If loose stools with the medication, patient asked to decrease the medication to every other day, or half dose daily.  Patient verbalized understanding  If patient continues to have straining or constipation despite daily MiraLAX, patient was advised to let us know and we can change her medication.  With recent colonoscopy that she has already had, this is unlikely to be due to new malignancy and repeat colonoscopy unlikely to change management at this time  Symptoms likely due to underlying possible hemorrhoids  If symptoms do not resolve, further testing can be considered  Repeat hemoglobin at this time as well    Dr [name]

## 2019-08-27 LAB — HEMOGLOBIN: Hemoglobin: 9.3 g/dL — ABNORMAL LOW (ref 11.1–15.9)

## 2019-09-04 ENCOUNTER — Ambulatory Visit
Admission: RE | Admit: 2019-09-04 | Discharge: 2019-09-04 | Disposition: A | Discharge status: Home / Self Care | Payer: Medicare PPO | Source: Ambulatory Visit | Attending: Family | Admitting: Family

## 2019-09-04 ENCOUNTER — Other Ambulatory Visit: Payer: Self-pay

## 2019-09-04 DIAGNOSIS — Z1231 Encounter for screening mammogram for malignant neoplasm of breast: Secondary | ICD-10-CM | POA: Insufficient documentation

## 2019-09-09 ENCOUNTER — Other Ambulatory Visit: Payer: Self-pay

## 2019-09-09 ENCOUNTER — Other Ambulatory Visit: Payer: Self-pay | Admitting: Family

## 2019-09-09 DIAGNOSIS — I10 Essential (primary) hypertension: Secondary | ICD-10-CM

## 2019-09-09 DIAGNOSIS — E785 Hyperlipidemia, unspecified: Secondary | ICD-10-CM

## 2019-09-09 DIAGNOSIS — K625 Hemorrhage of anus and rectum: Secondary | ICD-10-CM

## 2019-09-09 NOTE — Progress Notes (Signed)
.  hem

## 2019-09-15 ENCOUNTER — Other Ambulatory Visit: Payer: Self-pay

## 2019-09-16 ENCOUNTER — Ambulatory Visit (INDEPENDENT_AMBULATORY_CARE_PROVIDER_SITE_OTHER): Payer: Medicare PPO | Admitting: Family

## 2019-09-16 ENCOUNTER — Telehealth: Payer: Self-pay | Admitting: Family

## 2019-09-16 ENCOUNTER — Other Ambulatory Visit: Payer: Medicare PPO

## 2019-09-16 ENCOUNTER — Encounter: Payer: Self-pay | Admitting: Family

## 2019-09-16 VITALS — BP 130/78 | HR 75 | Temp 98.3°F | Ht 66.0 in | Wt 268.2 lb

## 2019-09-16 DIAGNOSIS — N189 Chronic kidney disease, unspecified: Secondary | ICD-10-CM | POA: Diagnosis not present

## 2019-09-16 DIAGNOSIS — K219 Gastro-esophageal reflux disease without esophagitis: Secondary | ICD-10-CM | POA: Diagnosis not present

## 2019-09-16 DIAGNOSIS — I10 Essential (primary) hypertension: Secondary | ICD-10-CM

## 2019-09-16 DIAGNOSIS — Z23 Encounter for immunization: Secondary | ICD-10-CM | POA: Diagnosis not present

## 2019-09-16 DIAGNOSIS — E782 Mixed hyperlipidemia: Secondary | ICD-10-CM

## 2019-09-16 DIAGNOSIS — R7303 Prediabetes: Secondary | ICD-10-CM

## 2019-09-16 LAB — LIPID PANEL
Cholesterol: 147 mg/dL (ref 0–200)
HDL: 57.1 mg/dL (ref >39.00)
LDL Cholesterol: 71 mg/dL (ref 0–99)
NonHDL: 89.44
Total CHOL/HDL Ratio: 3
Triglycerides: 91 mg/dL (ref 0.0–149.0)
VLDL: 18.2 mg/dL (ref 0.0–40.0)

## 2019-09-16 MED ORDER — METOPROLOL TARTRATE 25 MG PO TABS: 25.0000 mg | ORAL_TABLET | Freq: Two times a day (BID) | ORAL | 1 refills | Status: DC

## 2019-09-16 MED ORDER — FAMOTIDINE 20 MG PO TABS: 20.0000 mg | ORAL_TABLET | Freq: Two times a day (BID) | ORAL | 1 refills | Status: DC | PRN

## 2019-09-16 NOTE — Telephone Encounter (Signed)
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   Is her typical practice to dc losartan?   Would she be okay with low dose hctz and careful monitoring of renal function?   When is patient's next follow up with Dr foster? It should be this month as las visit was June 2021

## 2019-09-16 NOTE — Patient Instructions (Addendum)
STOP nexium as it can affect your kidneys Continue to stay off the ibuprofen, aleve as well  May use over the counter pepcid ac for acid reflux  Will call about changes to losartan after speaking to pharmacy.    Stay safe!

## 2019-09-16 NOTE — Progress Notes (Addendum)
Subjective:    Patient ID: Jennifer Huff, female    DOB: 1950/06/10, 69 y.o.   MRN: [id number]  CC: Jennifer Huff is a 69 y.o. female who presents today for follow up.   HPI: Feels well today No complaints  CKD- consult with nephrology, Dr Royce Macadamia, 06/2019. Has taken nexium a couple of time. Never started the pepcid.   HTN- compliant with amlodipine 10mg , losartan 50mg ,  Metoprolol 25mg  BID.  No cp, sob, At home 130/ 70.   HLD- compliant pravachol.   GERD- Endorses epigastric burning after certain foods, such as spicy, tomatoe.  had started nexium on her own. No h/o barrett's esophagus. No trouble swallowing.   Rectal bleeding resolved with use of using miralax 2-3 per week with miralax with colace daily.   Compliant with ferrous sulfate, however no more constipation on this supplement.     Consulted with Dr Bonna Gains in regards to rectal bleeding 08/21/19 Dr Tasia Catchings follow up for adrenal mass- advised to continue surveillance. Normocytic anemia, likely CKD. Advised ferrous sulfate 325 mg twice daily.    HISTORY:  Past Medical History:  Diagnosis Date  . Cataract   . GERD (gastroesophageal reflux disease)   . HTN (hypertension)   . Hypercholesteremia   . Vitamin D deficiency    Past Surgical History:  Procedure Laterality Date  . COLONOSCOPY WITH PROPOFOL N/A 05/06/2019   Procedure: COLONOSCOPY WITH PROPOFOL;  Surgeon: Virgel Manifold, MD;  Location: [facility] ENDOSCOPY;  Service: Endoscopy;  Laterality: N/A;  . KNEE ARTHROSCOPY WITH LATERAL MENISECTOMY Right 11/06/2017   Procedure: KNEE ARTHROSCOPY WITH REPAIR LATERAL MENISCAL ROOT TEAR;  Surgeon: Corky Mull, MD;  Location: [facility] ORS;  Service: Orthopedics;  Laterality: Right;  . LAPAROSCOPIC HYSTERECTOMY     Family History  Problem Relation Age of Onset  . Kidney disease Father   . Heart disease Father        died from mi  . Cancer Father        prostate  . Hypertension Sister   . Colon polyps Sister     . Hypertension Brother   . Hypertension Sister   . Diabetes Mother   . Hypertension Mother   . Cancer Paternal Grandmother        head and neck  . Hypertension Brother   . Cancer Son   . Colon cancer Neg Hx   . Breast cancer Neg Hx     Allergies: Ace inhibitors and Codeine Current Outpatient Medications on File Prior to Visit  Medication Sig Dispense Refill  . acetaminophen (TYLENOL) 500 MG tablet Take 1,000 mg by mouth daily as needed for mild pain.    Marland Kitchen amLODipine (NORVASC) 10 MG tablet Take 1 tablet (10 mg total) by mouth daily. 90 tablet 0  . calcium carbonate (TUMS - DOSED IN MG ELEMENTAL CALCIUM) 500 MG chewable tablet Chew 2 tablets by mouth daily as needed for indigestion or heartburn.    . Cholecalciferol (VITAMIN D3) 2000 units TABS Take 2,000 Units by mouth at bedtime.     . ferrous sulfate 325 (65 FE) MG EC tablet Take 1 tablet (325 mg total) by mouth 2 (two) times daily with a meal. 60 tablet 2  . ketorolac (ACULAR) 0.4 % SOLN Place 1 drop into the left eye 2 (two) times daily as needed (eye pain). 5 mL 6  . pravastatin (PRAVACHOL) 40 MG tablet Take 1 tablet (40 mg total) by mouth daily. 90 tablet 0   No current  facility-administered medications on file prior to visit.    Social History   Tobacco Use  . Smoking status: Former Smoker    Quit date: 12/07/1997    Years since quitting: 21.8  . Smokeless tobacco: Never Used  Vaping Use  . Vaping Use: Never used  Substance Use Topics  . Alcohol use: Not Currently  . Drug use: No    Review of Systems  Constitutional: Negative for chills and fever.  HENT: Negative for trouble swallowing.   Respiratory: Negative for cough.   Cardiovascular: Negative for chest pain and palpitations.  Gastrointestinal: Negative for blood in stool (resolved), constipation, nausea and vomiting.      Objective:    BP 130/78 (BP Location: Left Arm, Patient Position: Sitting, Cuff Size: Large)   Pulse 75   Temp 98.3 F (36.8 C)    Ht 5\' 6"  (1.676 m)   Wt 268 lb 3.2 oz (121.7 kg)   SpO2 98%   BMI 43.29 kg/m  BP Readings from Last 3 Encounters:  09/25/19 (!) 141/84  09/16/19 130/78  08/21/19 136/84   Wt Readings from Last 3 Encounters:  09/25/19 268 lb 9.6 oz (121.8 kg)  09/16/19 268 lb 3.2 oz (121.7 kg)  08/21/19 268 lb (121.6 kg)    Physical Exam Vitals reviewed.  Constitutional:      Appearance: She is well-developed.  Eyes:     Conjunctiva/sclera: Conjunctivae normal.  Cardiovascular:     Rate and Rhythm: Normal rate and regular rhythm.     Pulses: Normal pulses.     Heart sounds: Normal heart sounds.  Pulmonary:     Effort: Pulmonary effort is normal.     Breath sounds: Normal breath sounds. No wheezing, rhonchi or rales.  Skin:    General: Skin is warm and Huff.  Neurological:     Mental Status: She is alert.  Psychiatric:        Speech: Speech normal.        Behavior: Behavior normal.        Thought Content: Thought content normal.        Assessment & Plan:   Problem List Items Addressed This Visit      Cardiovascular and Mediastinum   Hypertension    Slightly elevated today. Patient will keep BP log at home. 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 Patient has compelling reason to be on RAAS in setting of DM, CKD. Advised to stay on medication for now and monitor for any symptoms of angioedema. I have also sent message to nephrology regarding their input as well, awaiting on call back as well.       Relevant Medications   metoprolol tartrate (LOPRESSOR) 25 MG tablet   losartan (COZAAR) 50 MG tablet     Digestive   GERD (gastroesophageal reflux disease)    Stable, advised pepcid ac due to safer profile.       Relevant Medications   famotidine (PEPCID) 20 MG tablet     Genitourinary   CKD (chronic kidney disease)    Stable, established with nephrology. Advised to stop nexium and start  pepcid as also recommended by Dr Royce Macadamia. Will follow.        Other   Mixed hyperlipidemia   Relevant Medications   metoprolol tartrate (LOPRESSOR) 25 MG tablet   losartan (COZAAR) 50 MG tablet   Other Relevant Orders   Lipid panel (Completed)   Hemoglobin A1c  Prediabetes   Relevant Orders   Hemoglobin A1c    Other Visit Diagnoses    Need for immunization against influenza    -  Primary   Relevant Orders   Flu Vaccine QUAD High Dose(Fluad) (Completed)   Need for vaccination with 13-polyvalent pneumococcal conjugate vaccine       Relevant Orders   Pneumococcal conjugate vaccine 13-valent IM (Completed)       I have discontinued [name]. Worley's esomeprazole and losartan. I have also changed her metoprolol tartrate. Additionally, I am having her start on famotidine and losartan. Lastly, I am having her maintain her Vitamin D3, acetaminophen, calcium carbonate, ketorolac, ferrous sulfate, pravastatin, and amLODipine.   Meds ordered this encounter  Medications  . famotidine (PEPCID) 20 MG tablet    Sig: Take 1 tablet (20 mg total) by mouth 2 (two) times daily as needed for heartburn or indigestion.    Dispense:  120 tablet    Refill:  1    Order Specific Question:   Supervising Provider    Answer:   Deborra Medina L [[id number]]  . metoprolol tartrate (LOPRESSOR) 25 MG tablet    Sig: Take 1 tablet (25 mg total) by mouth 2 (two) times daily.    Dispense:  120 tablet    Refill:  1    Order Specific Question:   Supervising Provider    Answer:   Deborra Medina L [[id number]]  . losartan (COZAAR) 50 MG tablet    Sig: Take 1 tablet (50 mg total) by mouth daily.    Dispense:  90 tablet    Refill:  3    Order Specific Question:   Supervising Provider    Answer:   Crecencio Mc [[id number]]    Return precautions given.   Risks, benefits, and alternatives of the medications and treatment plan prescribed today were discussed, and patient expressed understanding.   Education regarding  symptom management and diagnosis given to patient on AVS.  Continue to follow with Burnard Hawthorne, FNP for routine health maintenance.   Loetta Rough and I agreed with plan.   Mable Paris, FNP

## 2019-09-16 NOTE — Telephone Encounter (Signed)
I left detailed message for Dr. Luis Abed CMA Mechele Claude to please call me back in regards to patient. I did leave a detailed message explaining the reason for my call.

## 2019-09-17 LAB — HEMOGLOBIN A1C
Hgb A1c MFr Bld: 6 % of total Hgb — ABNORMAL HIGH (ref <5.7)
Mean Plasma Glucose: 126 (calc)
eAG (mmol/L): 7 (calc)

## 2019-09-17 MED ORDER — LOSARTAN POTASSIUM 50 MG PO TABS: 50.0000 mg | ORAL_TABLET | Freq: Every day | ORAL | 3 refills | Status: DC

## 2019-09-17 NOTE — Assessment & Plan Note (Signed)
Slightly elevated today. Patient will keep BP log at home. 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 Patient has compelling reason to be on RAAS in setting of DM, CKD. Advised to stay on medication for now and monitor for any symptoms of angioedema. I have also sent message to nephrology regarding their input as well, awaiting on call back as well.

## 2019-09-17 NOTE — Assessment & Plan Note (Signed)
Stable, established with nephrology. Advised to stop nexium and start pepcid as also recommended by Dr Royce Macadamia. Will follow.

## 2019-09-17 NOTE — Assessment & Plan Note (Signed)
Stable, advised pepcid ac due to safer profile.

## 2019-09-18 ENCOUNTER — Inpatient Hospital Stay: Payer: Medicare PPO | Attending: Oncology

## 2019-09-18 ENCOUNTER — Telehealth (INDEPENDENT_AMBULATORY_CARE_PROVIDER_SITE_OTHER): Payer: Medicare PPO | Admitting: Gastroenterology

## 2019-09-18 ENCOUNTER — Other Ambulatory Visit: Payer: Self-pay

## 2019-09-18 ENCOUNTER — Other Ambulatory Visit: Payer: Self-pay | Admitting: Oncology

## 2019-09-18 DIAGNOSIS — Z79899 Other long term (current) drug therapy: Secondary | ICD-10-CM | POA: Insufficient documentation

## 2019-09-18 DIAGNOSIS — K625 Hemorrhage of anus and rectum: Secondary | ICD-10-CM | POA: Diagnosis not present

## 2019-09-18 DIAGNOSIS — Z8616 Personal history of COVID-19: Secondary | ICD-10-CM | POA: Diagnosis not present

## 2019-09-18 DIAGNOSIS — D649 Anemia, unspecified: Secondary | ICD-10-CM | POA: Diagnosis not present

## 2019-09-18 DIAGNOSIS — D279 Benign neoplasm of unspecified ovary: Secondary | ICD-10-CM | POA: Diagnosis not present

## 2019-09-18 DIAGNOSIS — E278 Other specified disorders of adrenal gland: Secondary | ICD-10-CM

## 2019-09-18 DIAGNOSIS — E279 Disorder of adrenal gland, unspecified: Secondary | ICD-10-CM | POA: Diagnosis present

## 2019-09-18 LAB — RETIC PANEL
Immature Retic Fract: 15.1 % (ref 2.3–15.9)
RBC.: 3.64 MIL/uL — ABNORMAL LOW (ref 3.87–5.11)
Retic Count, Absolute: 76.1 10*3/uL (ref 19.0–186.0)
Retic Ct Pct: 2.1 % (ref 0.4–3.1)
Reticulocyte Hemoglobin: 29.7 pg (ref >27.9)

## 2019-09-18 LAB — CBC WITH DIFFERENTIAL/PLATELET
Abs Immature Granulocytes: 0.05 10*3/uL (ref 0.00–0.07)
Basophils Absolute: 0 10*3/uL (ref 0.0–0.1)
Basophils Relative: 0 %
Eosinophils Absolute: 0.1 10*3/uL (ref 0.0–0.5)
Eosinophils Relative: 2 %
HCT: 30.7 % — ABNORMAL LOW (ref 36.0–46.0)
Hemoglobin: 10 g/dL — ABNORMAL LOW (ref 12.0–15.0)
Immature Granulocytes: 1 %
Lymphocytes Relative: 28 %
Lymphs Abs: 1.4 10*3/uL (ref 0.7–4.0)
MCH: 27.4 pg (ref 26.0–34.0)
MCHC: 32.6 g/dL (ref 30.0–36.0)
MCV: 84.1 fL (ref 80.0–100.0)
Monocytes Absolute: 0.7 10*3/uL (ref 0.1–1.0)
Monocytes Relative: 15 %
Neutro Abs: 2.6 10*3/uL (ref 1.7–7.7)
Neutrophils Relative %: 54 %
Platelets: 169 10*3/uL (ref 150–400)
RBC: 3.65 MIL/uL — ABNORMAL LOW (ref 3.87–5.11)
RDW: 14.2 % (ref 11.5–15.5)
WBC: 4.9 10*3/uL (ref 4.0–10.5)
nRBC: 0 % (ref 0.0–0.2)

## 2019-09-18 LAB — COMPREHENSIVE METABOLIC PANEL
ALT: 11 U/L (ref 0–44)
AST: 14 U/L — ABNORMAL LOW (ref 15–41)
Albumin: 3.6 g/dL (ref 3.5–5.0)
Alkaline Phosphatase: 75 U/L (ref 38–126)
Anion gap: 8 (ref 5–15)
BUN: 19 mg/dL (ref 8–23)
CO2: 25 mmol/L (ref 22–32)
Calcium: 9.3 mg/dL (ref 8.9–10.3)
Chloride: 107 mmol/L (ref 98–111)
Creatinine, Ser: 1.45 mg/dL — ABNORMAL HIGH (ref 0.44–1.00)
GFR calc Af Amer: 43 mL/min — ABNORMAL LOW (ref >60)
GFR calc non Af Amer: 37 mL/min — ABNORMAL LOW (ref >60)
Glucose, Bld: 139 mg/dL — ABNORMAL HIGH (ref 70–99)
Potassium: 4.1 mmol/L (ref 3.5–5.1)
Sodium: 140 mmol/L (ref 135–145)
Total Bilirubin: 0.6 mg/dL (ref 0.3–1.2)
Total Protein: 7.2 g/dL (ref 6.5–8.1)

## 2019-09-18 LAB — IRON AND TIBC
Iron: 52 ug/dL (ref 28–170)
Saturation Ratios: 16 % (ref 10.4–31.8)
TIBC: 323 ug/dL (ref 250–450)
UIBC: 271 ug/dL

## 2019-09-18 LAB — FERRITIN: Ferritin: 145 ng/mL (ref 11–307)

## 2019-09-18 NOTE — Progress Notes (Signed)
I spoke with patient & I advised to continue Losartan. If ANY tongue, lip or throat swelling to stop losartan immediatly & call us. Pt will take BP at home for one week & call us with readings. Pt verbalized understanding of instructions.

## 2019-09-19 LAB — KAPPA/LAMBDA LIGHT CHAINS
Kappa free light chain: 60 mg/L — ABNORMAL HIGH (ref 3.3–19.4)
Kappa, lambda light chain ratio: 2.15 — ABNORMAL HIGH (ref 0.26–1.65)
Lambda free light chains: 27.9 mg/L — ABNORMAL HIGH (ref 5.7–26.3)

## 2019-09-19 NOTE — Progress Notes (Signed)
Jennifer Antigua, MD [address]  [place]  [place],  [address]  Main: [phone]  Fax: [phone]   Primary Care Physician: Burnard Hawthorne, FNP  Virtual Visit via Telephone Note  I connected with patient on 09/19/19 at  3:15 PM EDT by telephone and verified that I am speaking with the correct person using two identifiers.   I discussed the limitations, risks, security and privacy concerns of performing an evaluation and management service by telephone and the availability of in person appointments. I also discussed with the patient that there may be a patient responsible charge related to this service. The patient expressed understanding and agreed to proceed.  Location of Patient: Home Location of Provider: Home Persons involved: Patient and provider only during the visit (nursing staff and front desk staff was involved in communicating with the patient prior to the appointment, reviewing medications and checking them in)   History of Present Illness: Chief Complaint  Patient presents with   Rectal Bleeding     HPI: Jennifer Huff is a 69 y.o. female here for follow-up of rectal bleeding.  Patient reports complete resolution of symptoms since maintaining soft stool with MiraLAX daily. The patient denies abdominal or flank pain, anorexia, nausea or vomiting, dysphagia, change in bowel habits or black or bloody stools or weight loss.    Current Outpatient Medications  Medication Sig Dispense Refill   acetaminophen (TYLENOL) 500 MG tablet Take 1,000 mg by mouth daily as needed for mild pain.     amLODipine (NORVASC) 10 MG tablet Take 1 tablet (10 mg total) by mouth daily. 90 tablet 0   calcium carbonate (TUMS - DOSED IN MG ELEMENTAL CALCIUM) 500 MG chewable tablet Chew 2 tablets by mouth daily as needed for indigestion or heartburn.     Cholecalciferol (VITAMIN D3) 2000 units TABS Take 2,000 Units by mouth at bedtime.      docusate sodium  (COLACE) 100 MG capsule Take 1 capsule (100 mg total) by mouth daily. 90 capsule 0   famotidine (PEPCID) 20 MG tablet Take 1 tablet (20 mg total) by mouth 2 (two) times daily as needed for heartburn or indigestion. 120 tablet 1   ferrous sulfate 325 (65 FE) MG EC tablet Take 1 tablet (325 mg total) by mouth 2 (two) times daily with a meal. 60 tablet 2   ketorolac (ACULAR) 0.4 % SOLN Place 1 drop into the left eye 2 (two) times daily as needed (eye pain). 5 mL 6   losartan (COZAAR) 50 MG tablet Take 1 tablet (50 mg total) by mouth daily. 90 tablet 3   metoprolol tartrate (LOPRESSOR) 25 MG tablet Take 1 tablet (25 mg total) by mouth 2 (two) times daily. 120 tablet 1   pravastatin (PRAVACHOL) 40 MG tablet Take 1 tablet (40 mg total) by mouth daily. 90 tablet 0   No current facility-administered medications for this visit.    Allergies as of 09/18/2019 - Review Complete 09/18/2019  Allergen Reaction Noted   Ace inhibitors Swelling 12/08/2010   Codeine Other (See Comments) 12/08/2010    Review of Systems:    All systems reviewed and negative except where noted in HPI.   Observations/Objective:  Labs: CMP     Component Value Date/Time   NA 140 09/18/2019 1102   K 4.1 09/18/2019 1102   CL 107 09/18/2019 1102   CO2 25 09/18/2019 1102   GLUCOSE 139 (H) 09/18/2019 1102   BUN 19 09/18/2019 1102   BUN  13 05/25/2010 0000   CREATININE 1.45 (H) 09/18/2019 1102   CALCIUM 9.3 09/18/2019 1102   PROT 7.2 09/18/2019 1102   ALBUMIN 3.6 09/18/2019 1102   AST 14 (L) 09/18/2019 1102   ALT 11 09/18/2019 1102   ALKPHOS 75 09/18/2019 1102   BILITOT 0.6 09/18/2019 1102   GFRNONAA 37 (L) 09/18/2019 1102   GFRAA 43 (L) 09/18/2019 1102   Lab Results  Component Value Date   WBC 4.9 09/18/2019   HGB 10.0 (L) 09/18/2019   HCT 30.7 (L) 09/18/2019   MCV 84.1 09/18/2019   PLT 169 09/18/2019    Imaging Studies: MM 3D SCREEN BREAST BILATERAL  Result Date: 09/05/2019 CLINICAL DATA:  Screening.  EXAM: DIGITAL SCREENING BILATERAL MAMMOGRAM WITH TOMO AND CAD COMPARISON:  Previous exam(s). ACR Breast Density Category a: The breast tissue is almost entirely fatty. FINDINGS: There are no findings suspicious for malignancy. Images were processed with CAD. IMPRESSION: No mammographic evidence of malignancy. A result letter of this screening mammogram will be mailed directly to the patient. RECOMMENDATION: Screening mammogram in one year. (Code:SM-B-01Y) BI-RADS CATEGORY  1: Negative. Electronically Signed   By: Franki Cabot M.D.   On: 09/05/2019 12:52    Assessment and Plan:   Jennifer Huff is a 69 y.o. y/o female here for follow-up of rectal bleeding  Assessment and Plan: Symptoms completely resolved since treatment of constipation  High-fiber diet MiraLAX daily with goal of 1-2 soft bowel movements daily.  If not at goal, patient instructed to increase dose to twice daily.  If loose stools with the medication, patient asked to decrease the medication to every other day, or half dose daily.  Patient verbalized understanding   Follow Up Instructions:    I discussed the assessment and treatment plan with the patient. The patient was provided an opportunity to ask questions and all were answered. The patient agreed with the plan and demonstrated an understanding of the instructions.   The patient was advised to call back or seek an in-person evaluation if the symptoms worsen or if the condition fails to improve as anticipated.  I provided 12 minutes of non-face-to-face time during this encounter. Additional time was spent in reviewing patient's chart, placing orders etc.   Jennifer Manifold, MD  Speech recognition software was used to dictate this note.

## 2019-09-22 LAB — MULTIPLE MYELOMA PANEL, SERUM
Albumin SerPl Elph-Mcnc: 3.5 g/dL (ref 2.9–4.4)
Albumin/Glob SerPl: 1.2 (ref 0.7–1.7)
Alpha 1: 0.3 g/dL (ref 0.0–0.4)
Alpha2 Glob SerPl Elph-Mcnc: 0.8 g/dL (ref 0.4–1.0)
B-Globulin SerPl Elph-Mcnc: 1 g/dL (ref 0.7–1.3)
Gamma Glob SerPl Elph-Mcnc: 1 g/dL (ref 0.4–1.8)
Globulin, Total: 3 g/dL (ref 2.2–3.9)
IgA: 147 mg/dL (ref 87–352)
IgG (Immunoglobin G), Serum: 909 mg/dL (ref 586–1602)
IgM (Immunoglobulin M), Srm: 85 mg/dL (ref 26–217)
Total Protein ELP: 6.5 g/dL (ref 6.0–8.5)

## 2019-09-25 ENCOUNTER — Telehealth: Payer: Self-pay | Admitting: Family

## 2019-09-25 ENCOUNTER — Inpatient Hospital Stay (HOSPITAL_BASED_OUTPATIENT_CLINIC_OR_DEPARTMENT_OTHER): Payer: Medicare PPO | Admitting: Oncology

## 2019-09-25 ENCOUNTER — Encounter: Payer: Self-pay | Admitting: Oncology

## 2019-09-25 ENCOUNTER — Other Ambulatory Visit: Payer: Self-pay

## 2019-09-25 VITALS — BP 141/84 | HR 77 | Temp 97.2°F | Resp 18 | Wt 268.6 lb

## 2019-09-25 DIAGNOSIS — E278 Other specified disorders of adrenal gland: Secondary | ICD-10-CM | POA: Diagnosis not present

## 2019-09-25 DIAGNOSIS — D279 Benign neoplasm of unspecified ovary: Secondary | ICD-10-CM | POA: Diagnosis not present

## 2019-09-25 DIAGNOSIS — D649 Anemia, unspecified: Secondary | ICD-10-CM | POA: Diagnosis not present

## 2019-09-25 NOTE — Progress Notes (Signed)
Pt here for follow up. No new concerns voiced.   

## 2019-09-25 NOTE — Progress Notes (Signed)
Hematology/Oncology follow up note [facility] Telephone:([phone] Fax:([phone]   Patient Care Team: Burnard Hawthorne, FNP as PCP - General (Family Medicine) Clent Jacks, RN as Oncology Nurse Navigator  REFERRING PROVIDER: Burnard Hawthorne, FNP  CHIEF COMPLAINTS/REASON FOR VISIT:  Follow-up visit for adrenal mass, microcytosis.  HISTORY OF PRESENTING ILLNESS:  Jennifer Huff is a  69 y.o.  female with PMH listed below was seen in consultation at the request of  Burnard Hawthorne, FNP  for evaluation of adrenal mass  Patient recently presented to emergency room on 01/01/2019 complaining left sided abdominal/back pain.  Lasted for about 24 hours, pain started in the left back radiating around to the left lower quadrant. Prior to that she was diagnosed COVID-19 infection on 12/21/2018. Patient had CT renal stone study done on 01/01/2019 With normal expansion, heterogeneously density and stranding with adjacent fluid along the left ovary. In addition, there is no specific mass of the left measuring up to 2.8 cm in long axis with marginal stranding. There is at least 4 abnormal hypodense lesion in the spleen.  Bilateral renal lesions.  Faint subpleural groundglass opacities in both lungs.  01/01/2019 US Pelvis showed a heterogeneously enlarged left ovary for postmenopausal patient.  Ovarian volume 51.7.  Ovarian parenchyma is heterogeneous.  Blood flow without evidence of torsion.  Ovarian neoplasm is considered, though a discrete cystic or solid mass is difficult to delineate from diffuse heterogeneity.  Small volume of pelvic free fluid.  #01/01/2019 MRI abdomen with and without contrast showed no acute findings noted in abdomen to account for patient's symptoms.  2.9 x 1.9 cm left adrenal nodule has suspicious characteristics.  Patchy ill-defined areas of increased signal intensity in the visualized lung bases which likely reflux.  19  infection.  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    Patient was released from the emergency room to follow-up with primary care provider and GYN physician. Patient has an appointment with GYN on 01/16/2019. Patient was referred by primary care provider to cancer center for evaluation of adrenal mass.  Patient was accompanied by her husband.  She denies any additional episodes of left lower quadrant discomfort.  No pain  #History of COVID-19 infection  #seen by urology Dr. 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  No intervention was recommended for adrenal adenoma at this point.  She was recommended to repeat MRI in 1 year to ensure stability or return sooner to urology if she develops symptoms.  05/06/2019.  Colonoscopy was done which showed multiple polyps.  No malignancy.  Patient was recommended to repeat colonoscopy in 5 years.  INTERVAL HISTORY Jennifer Huff is a 69 y.o. female who has above history reviewed by me today presents for follow up visit for management of renal mass and patient was also referred back for microcytic anemia. Problems and complaints are listed below: Patient reports feeling well. Patient takes oral iron supplementation twice daily.  Iron causes constipation and she takes Colace and MiraLAX.  Review of Systems  Constitutional: Negative for appetite change, chills, fatigue and fever.  HENT:   Negative for hearing loss and voice change.   Eyes: Negative for eye problems.  Respiratory: Negative for chest tightness and cough.   Cardiovascular: Negative for chest pain.  Gastrointestinal: Negative for abdominal distention, abdominal pain and blood in stool.  Endocrine: Negative for hot flashes.  Genitourinary: Negative for difficulty urinating and frequency.   Musculoskeletal: Negative for  arthralgias.  Skin: Negative for itching and rash.  Neurological: Negative for extremity weakness.  Hematological: Negative for adenopathy.  Psychiatric/Behavioral: Negative for confusion.    MEDICAL HISTORY:  Past Medical History:  Diagnosis Date  . Cataract   . GERD (gastroesophageal reflux disease)   . HTN (hypertension)   . Hypercholesteremia   . Vitamin D deficiency     SURGICAL HISTORY: Past Surgical History:  Procedure Laterality Date  . COLONOSCOPY WITH PROPOFOL N/A 05/06/2019   Procedure: COLONOSCOPY WITH PROPOFOL;  Surgeon: Virgel Manifold, MD;  Location: [facility] ENDOSCOPY;  Service: Endoscopy;  Laterality: N/A;  . KNEE ARTHROSCOPY WITH LATERAL MENISECTOMY Right 11/06/2017   Procedure: KNEE ARTHROSCOPY WITH REPAIR LATERAL MENISCAL ROOT TEAR;  Surgeon: Corky Mull, MD;  Location: [facility] ORS;  Service: Orthopedics;  Laterality: Right;  . LAPAROSCOPIC HYSTERECTOMY      SOCIAL HISTORY: Social History   Socioeconomic History  . Marital status: Married    Spouse name: Not on file  . Number of children: 3  . Years of education: Not on file  . Highest education level: Not on file  Occupational History  . 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 Smoking status: Former Smoker    Quit date: 12/07/1997    Years since quitting: 21.8  . Smokeless tobacco: Never Used  Vaping Use  . Vaping Use: Never used  Substance and Sexual Activity  . Alcohol use: Not Currently  . Drug use: No  . Sexual activity: Not Currently  Other Topics Concern  . Not on file  Social History Narrative   Regular Exercise -  NO   Daily Caffeine Use:  2-3 soda/sw tea      Retired   Scientist, physiological Strain: Low Risk   . Difficulty of Paying Living Expenses: Not hard at all  Food Insecurity: No Food Insecurity  . Worried About Charity fundraiser in the Last Year: Never true  . Ran Out of Food in the Last Year: Never true  Transportation  Needs: No Transportation Needs  . Lack of Transportation (Medical): No  . Lack of Transportation (Non-Medical): No  Physical Activity: Unknown  . Days of Exercise per Week: 0 days  . Minutes of Exercise per Session: Not on file  Stress: No Stress Concern Present  . Feeling of Stress : Not at all  Social Connections:   . Frequency of Communication with Friends and Family: Not on file  . Frequency of Social Gatherings with Friends and Family: Not on file  . Attends Religious Services: Not on file  . Active Member of Clubs or Organizations: Not on file  . Attends Archivist Meetings: Not on file  . Marital Status: Not on file  Intimate Partner Violence: Not At Risk  . Fear of Current or Ex-Partner: No  . Emotionally Abused: No  . Physically Abused: No  . Sexually Abused: No    FAMILY HISTORY: Family History  Problem Relation Age of Onset  . Kidney disease Father   . Heart disease Father        died from mi  . Cancer Father        prostate  . Hypertension Sister   . Colon polyps Sister   . Hypertension Brother   . Hypertension Sister   . Diabetes Mother   .  Hypertension Mother   . Cancer Paternal Grandmother        head and neck  . Hypertension Brother   . Cancer Son   . Colon cancer Neg Hx   . Breast cancer Neg Hx     ALLERGIES:  is allergic to ace inhibitors and codeine.  MEDICATIONS:  Current Outpatient Medications  Medication Sig Dispense Refill  . acetaminophen (TYLENOL) 500 MG tablet Take 1,000 mg by mouth daily as needed for mild pain.    Marland Kitchen amLODipine (NORVASC) 10 MG tablet Take 1 tablet (10 mg total) by mouth daily. 90 tablet 0  . calcium carbonate (TUMS - DOSED IN MG ELEMENTAL CALCIUM) 500 MG chewable tablet Chew 2 tablets by mouth daily as needed for indigestion or heartburn.    . Cholecalciferol (VITAMIN D3) 2000 units TABS Take 2,000 Units by mouth at bedtime.     . docusate sodium (COLACE) 100 MG capsule Take 1 capsule (100 mg total) by mouth  daily. 90 capsule 0  . famotidine (PEPCID) 20 MG tablet Take 1 tablet (20 mg total) by mouth 2 (two) times daily as needed for heartburn or indigestion. 120 tablet 1  . ferrous sulfate 325 (65 FE) MG EC tablet Take 1 tablet (325 mg total) by mouth 2 (two) times daily with a meal. 60 tablet 2  . ketorolac (ACULAR) 0.4 % SOLN Place 1 drop into the left eye 2 (two) times daily as needed (eye pain). 5 mL 6  . losartan (COZAAR) 50 MG tablet Take 1 tablet (50 mg total) by mouth daily. 90 tablet 3  . metoprolol tartrate (LOPRESSOR) 25 MG tablet Take 1 tablet (25 mg total) by mouth 2 (two) times daily. 120 tablet 1  . pravastatin (PRAVACHOL) 40 MG tablet Take 1 tablet (40 mg total) by mouth daily. 90 tablet 0   No current facility-administered medications for this visit.     PHYSICAL EXAMINATION: ECOG PERFORMANCE STATUS: 1 - Symptomatic but completely ambulatory Vitals:   09/25/19 1035  BP: (!) 141/84  Pulse: 77  Resp: 18  Temp: (!) 97.2 F (36.2 C)   Filed Weights   09/25/19 1035  Weight: 268 lb 9.6 oz (121.8 kg)    Physical Exam Constitutional:      Appearance: Normal appearance.  HENT:     Head: Normocephalic.  Eyes:     General: No scleral icterus. Cardiovascular:     Rate and Rhythm: Normal rate.  Pulmonary:     Effort: Pulmonary effort is normal.  Abdominal:     General: There is no distension.     Palpations: Abdomen is soft.  Musculoskeletal:        General: Normal range of motion.     Cervical back: Normal range of motion.  Skin:    Coloration: Skin is not jaundiced.  Neurological:     General: No focal deficit present.     Mental Status: She is alert.  Psychiatric:        Mood and Affect: Mood normal.     LABORATORY DATA:  I have reviewed the data as listed Lab Results  Component Value Date   WBC 4.9 09/18/2019   HGB 10.0 (L) 09/18/2019   HCT 30.7 (L) 09/18/2019   MCV 84.1 09/18/2019   PLT 169 09/18/2019   Recent Labs    01/01/19 1501 01/01/19 1501  02/12/19 1044 02/12/19 1044 03/12/19 1129 06/23/19 0833 09/18/19 1102  NA 143   < > 140   < > 140 142 140  K 4.1   < > 4.0   < > 4.1 4.2 4.1  CL 108   < > 108   < > 109 110 107  CO2 24   < > 27   < > 28 24 25   GLUCOSE 143*   < > 110*   < > 120* 130* 139*  BUN 16   < > 15   < > 18 19 19   CREATININE 1.49*   < > 1.40*   < > 1.30* 1.42* 1.45*  CALCIUM 10.1   < > 10.0   < > 10.0 9.4 9.3  GFRNONAA 36*  --   --   --   --  38* 37*  GFRAA 42*  --   --   --   --  44* 43*  PROT 7.6   < > 7.2  --   --  7.5 7.2  ALBUMIN 3.5   < > 4.1  --   --  3.8 3.6  AST 41   < > 15  --   --  15 14*  ALT 33   < > 10  --   --  11 11  ALKPHOS 57   < > 73  --   --  84 75  BILITOT 1.2   < > 0.7  --   --  0.8 0.6   < > = values in this interval not displayed.   Iron/TIBC/Ferritin/ %Sat    Component Value Date/Time   IRON 52 09/18/2019 1102   IRON 60 06/04/2019 1129   TIBC 323 09/18/2019 1102   TIBC 294 06/04/2019 1129   FERRITIN 145 09/18/2019 1102   FERRITIN 284 (H) 06/04/2019 1129   IRONPCTSAT 16 09/18/2019 1102   IRONPCTSAT 20 06/04/2019 1129      RADIOGRAPHIC STUDIES: I have personally reviewed the radiological images as listed and agreed with the findings in the report. MM 3D SCREEN BREAST BILATERAL  Result Date: 09/05/2019 CLINICAL DATA:  Screening. EXAM: DIGITAL SCREENING BILATERAL MAMMOGRAM WITH TOMO AND CAD COMPARISON:  Previous exam(s). ACR Breast Density Category a: The breast tissue is almost entirely fatty. FINDINGS: There are no findings suspicious for malignancy. Images were processed with CAD. IMPRESSION: No mammographic evidence of malignancy. A result letter of this screening mammogram will be mailed directly to the patient. RECOMMENDATION: Screening mammogram in one year. (Code:SM-B-01Y) BI-RADS CATEGORY  1: Negative. Electronically Signed   By: Franki Cabot M.D.   On: 09/05/2019 12:52   OCT, Retina - OU - Both Eyes  Result Date: 06/30/2019 Right Eye Quality was good. Scan locations  included subfoveal. Central Foveal Thickness: 268. Progression has been stable. Findings include normal observations. Left Eye Quality was good. Scan locations included subfoveal. Central Foveal Thickness: 313. Progression has been stable. Findings include normal observations.       ASSESSMENT & PLAN:  1. Adrenal mass ([place])   2. Normocytic anemia    #Anemia, Labs are reviewed and discussed with patient. Hemoglobin is at 10.  Iron panel showed iron saturation 16, ferritin 145. Anemia is most likely secondary to chronic kidney disease and possibly underlying functional iron deficiency Patient takes oral iron supplementation and prefers to continue iron supplementation instead of IV Venofer treatments. Currently hemoglobin is 10 and I think that is reasonable. Continue monitor.  Repeat blood work in 6 months . #Adrenal mass, previously evaluated by urology.  I will defer Dr. Erlene Quan for follow-up adrenal mass.  Recommend patient to continue follow-up with urology and  have repeat MRI done for follow-up..  Orders Placed This Encounter  Procedures  . CBC with Differential/Platelet    Standing Status:   Future    Standing Expiration Date:   09/24/2020  . Comprehensive metabolic panel    Standing Status:   Future    Standing Expiration Date:   09/24/2020  . Iron and TIBC    Standing Status:   Future    Standing Expiration Date:   09/24/2020  . Ferritin    Standing Status:   Future    Standing Expiration Date:   09/24/2020    All questions were answered. The patient knows to call the clinic with any problems questions or concerns.  Return of visit: 6 months Earlie Server, MD, PhD Hematology Oncology [facility] Pager- [phone] 09/25/2019

## 2019-09-26 NOTE — Telephone Encounter (Signed)
close

## 2019-09-30 ENCOUNTER — Telehealth: Payer: Self-pay | Admitting: Family

## 2019-09-30 NOTE — Telephone Encounter (Signed)
I called patient & let her know that prevnar 13 was given by mistake that she should have gotten pneumovax 23. I told patient that she will receive correct vaccine one year from now. I asked she please let us know if she receives a bill, but charges should be dropped.

## 2019-09-30 NOTE — Telephone Encounter (Signed)
Call pt Please let her know that last pneumonia vaccine was mistakenly prevnar 42; it should have pneumococcal 23.  Spoke with pharmacy and you may reiterate that there are no safety concerns here.   Further more, She should not get a bill for this; let us know if she does  Lastly she will need pneumococcal 23 ONE year from now and will have completed pneumonia series

## 2019-10-09 ENCOUNTER — Other Ambulatory Visit: Payer: Self-pay

## 2019-10-09 ENCOUNTER — Emergency Department
Admission: EM | Admit: 2019-10-09 | Discharge: 2019-10-09 | Disposition: A | Discharge status: Home / Self Care | Payer: Medicare PPO | Attending: Emergency Medicine | Admitting: Emergency Medicine

## 2019-10-09 ENCOUNTER — Encounter: Payer: Self-pay | Admitting: Emergency Medicine

## 2019-10-09 ENCOUNTER — Emergency Department: Payer: Medicare PPO

## 2019-10-09 DIAGNOSIS — I129 Hypertensive chronic kidney disease with stage 1 through stage 4 chronic kidney disease, or unspecified chronic kidney disease: Secondary | ICD-10-CM | POA: Insufficient documentation

## 2019-10-09 DIAGNOSIS — Z79891 Long term (current) use of opiate analgesic: Secondary | ICD-10-CM | POA: Diagnosis not present

## 2019-10-09 DIAGNOSIS — R1013 Epigastric pain: Secondary | ICD-10-CM | POA: Diagnosis present

## 2019-10-09 DIAGNOSIS — N189 Chronic kidney disease, unspecified: Secondary | ICD-10-CM | POA: Diagnosis not present

## 2019-10-09 DIAGNOSIS — Z79899 Other long term (current) drug therapy: Secondary | ICD-10-CM | POA: Diagnosis not present

## 2019-10-09 LAB — CBC WITH DIFFERENTIAL/PLATELET
Abs Immature Granulocytes: 0.02 10*3/uL (ref 0.00–0.07)
Basophils Absolute: 0 10*3/uL (ref 0.0–0.1)
Basophils Relative: 0 %
Eosinophils Absolute: 0.1 10*3/uL (ref 0.0–0.5)
Eosinophils Relative: 1 %
HCT: 34.6 % — ABNORMAL LOW (ref 36.0–46.0)
Hemoglobin: 11.1 g/dL — ABNORMAL LOW (ref 12.0–15.0)
Immature Granulocytes: 0 %
Lymphocytes Relative: 26 %
Lymphs Abs: 1.8 10*3/uL (ref 0.7–4.0)
MCH: 27 pg (ref 26.0–34.0)
MCHC: 32.1 g/dL (ref 30.0–36.0)
MCV: 84.2 fL (ref 80.0–100.0)
Monocytes Absolute: 0.9 10*3/uL (ref 0.1–1.0)
Monocytes Relative: 13 %
Neutro Abs: 4.3 10*3/uL (ref 1.7–7.7)
Neutrophils Relative %: 60 %
Platelets: 182 10*3/uL (ref 150–400)
RBC: 4.11 MIL/uL (ref 3.87–5.11)
RDW: 13.8 % (ref 11.5–15.5)
WBC: 7.1 10*3/uL (ref 4.0–10.5)
nRBC: 0 % (ref 0.0–0.2)

## 2019-10-09 LAB — COMPREHENSIVE METABOLIC PANEL
ALT: 11 U/L (ref 0–44)
AST: 16 U/L (ref 15–41)
Albumin: 3.9 g/dL (ref 3.5–5.0)
Alkaline Phosphatase: 88 U/L (ref 38–126)
Anion gap: 6 (ref 5–15)
BUN: 18 mg/dL (ref 8–23)
CO2: 28 mmol/L (ref 22–32)
Calcium: 10 mg/dL (ref 8.9–10.3)
Chloride: 107 mmol/L (ref 98–111)
Creatinine, Ser: 1.41 mg/dL — ABNORMAL HIGH (ref 0.44–1.00)
GFR calc non Af Amer: 38 mL/min — ABNORMAL LOW (ref >60)
Glucose, Bld: 169 mg/dL — ABNORMAL HIGH (ref 70–99)
Potassium: 3.8 mmol/L (ref 3.5–5.1)
Sodium: 141 mmol/L (ref 135–145)
Total Bilirubin: 0.8 mg/dL (ref 0.3–1.2)
Total Protein: 7.3 g/dL (ref 6.5–8.1)

## 2019-10-09 LAB — URINALYSIS, COMPLETE (UACMP) WITH MICROSCOPIC
Bilirubin Urine: NEGATIVE
Glucose, UA: NEGATIVE mg/dL
Hgb urine dipstick: NEGATIVE
Ketones, ur: NEGATIVE mg/dL
Leukocytes,Ua: NEGATIVE
Nitrite: NEGATIVE
Protein, ur: NEGATIVE mg/dL
Specific Gravity, Urine: 1.009 (ref 1.005–1.030)
WBC, UA: NONE SEEN WBC/hpf (ref 0–5)
pH: 5 (ref 5.0–8.0)

## 2019-10-09 LAB — TROPONIN I (HIGH SENSITIVITY)
Troponin I (High Sensitivity): 5 ng/L (ref <18)
Troponin I (High Sensitivity): 4 ng/L (ref <18)

## 2019-10-09 LAB — LIPASE, BLOOD: Lipase: 30 U/L (ref 11–51)

## 2019-10-09 MED ORDER — ALUM & MAG HYDROXIDE-SIMETH 200-200-20 MG/5ML PO SUSP
30.0000 mL | Freq: Once | ORAL | Status: AC
  Administered 2019-10-09: 30 mL via ORAL
  Filled 2019-10-09: qty 30

## 2019-10-09 MED ORDER — LIDOCAINE VISCOUS HCL 2 % MT SOLN
15.0000 mL | Freq: Once | OROMUCOSAL | Status: AC
  Administered 2019-10-09: 15 mL via ORAL
  Filled 2019-10-09: qty 15

## 2019-10-09 MED ORDER — DICYCLOMINE HCL 10 MG/5ML PO SOLN
10.0000 mg | Freq: Once | ORAL | Status: DC
  Filled 2019-10-09: qty 5

## 2019-10-09 NOTE — ED Triage Notes (Addendum)
Pt arrived to ED with c/o epigastric pain that radiates into her back and sometimes into the sides of her neck tonight. Pt states she has hx of the same but never this severe. Denies n/v. States sitting up seems to make pain worse.

## 2019-10-09 NOTE — Discharge Instructions (Addendum)
Please use your famotidine every day for the next week or so.  Follow-up with your regular doctor.  Just to be safe I would also follow-up with Dr. Rockey Situ.  You have seen him before.  He is a cardiologist.  Please return if you get worse again.  The blood work and chest x-ray and EKG we did today all look okay.

## 2019-10-09 NOTE — ED Provider Notes (Signed)
[facility] Emergency Department Provider Note   ____________________________________________   First MD Initiated Contact with Patient 10/09/19 1115     (approximate)  I have reviewed the triage vital signs and the nursing notes.   HISTORY  Chief Complaint Abdominal Cramping    HPI Jennifer Huff is a 69 y.o. female who reports some epigastric pain.  She said it radiated into her back and she told the nurse that actually went up into her neck but she did not tell me that.  She said it felt like her usual reflux pain or gas being stuck.  It was worse when she sat down but did not bother her as much when she was walking or exercising.  She took some famotidine yesterday and today it is better.  Patient reports she has had this before as I mentioned but yesterday was the worst it has been.  Pain is gone now.        Past Medical History:  Diagnosis Date  . Cataract   . GERD (gastroesophageal reflux disease)   . HTN (hypertension)   . Hypercholesteremia   . Vitamin D deficiency     Patient Active Problem List   Diagnosis Date Noted  . GERD (gastroesophageal reflux disease) 09/16/2019  . Posterior vitreous detachment of right eye 06/30/2019  . Cystoid macular edema of left eye 06/30/2019  . History of vitrectomy 06/30/2019  . Enlarged ovary 01/08/2019  . Adrenal mass ([place]) 01/08/2019  . CKD (chronic kidney disease) 01/08/2019  . Decreased appetite 12/30/2018  . Status post arthroscopic surgery of right knee 11/20/2017  . Complex tear of lateral meniscus of right knee as current injury 11/09/2017  . Primary osteoarthritis of right knee 11/09/2017  . BMI 40.0-44.9, adult ([place]) 11/04/2017  . Chest pain with moderate risk for cardiac etiology 04/26/2017  . Prediabetes 04/26/2017  . Mixed hyperlipidemia 04/26/2017  . Former smoker 04/25/2017  . Anxiety and depression 04/11/2017  . Dry skin 06/29/2015  . Postmenopausal estrogen deficiency  12/23/2014  . Hemorrhoid 12/23/2014  . Screening for breast cancer 06/23/2014  . Allergic rhinitis 06/23/2014  . Routine general medical examination at a health care facility 12/16/2012  . Hypertension 12/08/2010  . Morbid obesity ([place]) 12/08/2010    Past Surgical History:  Procedure Laterality Date  . COLONOSCOPY WITH PROPOFOL N/A 05/06/2019   Procedure: COLONOSCOPY WITH PROPOFOL;  Surgeon: Virgel Manifold, MD;  Location: [facility] ENDOSCOPY;  Service: Endoscopy;  Laterality: N/A;  . KNEE ARTHROSCOPY WITH LATERAL MENISECTOMY Right 11/06/2017   Procedure: KNEE ARTHROSCOPY WITH REPAIR LATERAL MENISCAL ROOT TEAR;  Surgeon: Corky Mull, MD;  Location: [facility] ORS;  Service: Orthopedics;  Laterality: Right;  . LAPAROSCOPIC HYSTERECTOMY      Prior to Admission medications   Medication Sig Start Date End Date Taking? Authorizing Provider  acetaminophen (TYLENOL) 500 MG tablet Take 1,000 mg by mouth daily as needed for mild pain.    [provider]  amLODipine (NORVASC) 10 MG tablet Take 1 tablet (10 mg total) by mouth daily. 09/09/19   Burnard Hawthorne, FNP  calcium carbonate (TUMS - DOSED IN MG ELEMENTAL CALCIUM) 500 MG chewable tablet Chew 2 tablets by mouth daily as needed for indigestion or heartburn.    [provider]  Cholecalciferol (VITAMIN D3) 2000 units TABS Take 2,000 Units by mouth at bedtime.     [provider]  docusate sodium (COLACE) 100 MG capsule Take 1 capsule (100 mg total) by mouth daily. 09/18/19  Earlie Server, MD  famotidine (PEPCID) 20 MG tablet Take 1 tablet (20 mg total) by mouth 2 (two) times daily as needed for heartburn or indigestion. 09/16/19   Burnard Hawthorne, FNP  ferrous sulfate 325 (65 FE) MG EC tablet Take 1 tablet (325 mg total) by mouth 2 (two) times daily with a meal. 08/19/19   Earlie Server, MD  ketorolac (ACULAR) 0.4 % SOLN Place 1 drop into the left eye 2 (two) times daily as needed (eye pain). 06/30/19 06/29/20  Rankin, Clent Demark, MD   losartan (COZAAR) 50 MG tablet Take 1 tablet (50 mg total) by mouth daily. 09/17/19   Burnard Hawthorne, FNP  metoprolol tartrate (LOPRESSOR) 25 MG tablet Take 1 tablet (25 mg total) by mouth 2 (two) times daily. 09/16/19   Burnard Hawthorne, FNP  pravastatin (PRAVACHOL) 40 MG tablet Take 1 tablet (40 mg total) by mouth daily. 09/09/19   Burnard Hawthorne, FNP    Allergies Ace inhibitors and Codeine  Family History  Problem Relation Age of Onset  . Kidney disease Father   . Heart disease Father        died from mi  . Cancer Father        prostate  . Hypertension Sister   . Colon polyps Sister   . Hypertension Brother   . Hypertension Sister   . Diabetes Mother   . Hypertension Mother   . Cancer Paternal Grandmother        head and neck  . Hypertension Brother   . Cancer Son   . Colon cancer Neg Hx   . Breast cancer Neg Hx     Social History Social History   Tobacco Use  . Smoking status: Former Smoker    Types: Cigarettes    Quit date: 12/07/1997    Years since quitting: 21.8  . Smokeless tobacco: Never Used  Vaping Use  . Vaping Use: Never used  Substance Use Topics  . Alcohol use: Not Currently  . Drug use: No    Review of Systems  Constitutional: No fever/chills Eyes: No visual changes. ENT: No sore throat. Cardiovascular: Denies chest pain. Respiratory: Denies shortness of breath. Gastrointestinal: No abdominal pain.  No nausea, no vomiting.  No diarrhea.  No constipation. Genitourinary: Negative for dysuria. Musculoskeletal: Negative for back pain. Skin: Negative for rash. Neurological: Negative for headaches, focal weakness   ____________________________________________   PHYSICAL EXAM:  VITAL SIGNS: ED Triage Vitals  Enc Vitals Group     BP 10/09/19 0239 (!) 155/96     Pulse Rate 10/09/19 0239 81     Resp 10/09/19 0239 17     Temp 10/09/19 0239 98.5 F (36.9 C)     Temp Source 10/09/19 0239 Oral     SpO2 10/09/19 0239 98 %     Weight  10/09/19 0247 268 lb (121.6 kg)     Height 10/09/19 0247 5\' 7"  (1.702 m)     Head Circumference --      Peak Flow --      Pain Score 10/09/19 0247 8     Pain Loc --      Pain Edu? --      Excl. in [place]? --    Constitutional: Alert and oriented. Well appearing and in no acute distress. Eyes: Conjunctivae are normal.  Head: Atraumatic. Nose: No congestion/rhinnorhea. Mouth/Throat: Mucous membranes are moist.  Oropharynx non-erythematous. Neck: No stridor.  Cardiovascular: Normal rate, regular rhythm. Grossly normal heart sounds.  Good  peripheral circulation. Respiratory: Normal respiratory effort.  No retractions. Lungs CTAB. Gastrointestinal: Soft and nontender. No distention. No abdominal bruits. No CVA tenderness. Musculoskeletal: No lower extremity tenderness nor edema.   Neurologic:  Normal speech and language. No gross focal neurologic deficits are appreciated. . Skin:  Skin is warm, dry and intact. No rash noted.   ____________________________________________   LABS (all labs ordered are listed, but only abnormal results are displayed)  Labs Reviewed  CBC WITH DIFFERENTIAL/PLATELET - Abnormal; Notable for the following components:      Result Value   Hemoglobin 11.1 (*)    HCT 34.6 (*)    All other components within normal limits  COMPREHENSIVE METABOLIC PANEL - Abnormal; Notable for the following components:   Glucose, Bld 169 (*)    Creatinine, Ser 1.41 (*)    GFR calc non Af Amer 38 (*)    All other components within normal limits  URINALYSIS, COMPLETE (UACMP) WITH MICROSCOPIC - Abnormal; Notable for the following components:   Color, Urine STRAW (*)    APPearance CLEAR (*)    Bacteria, UA RARE (*)    All other components within normal limits  LIPASE, BLOOD  TROPONIN I (HIGH SENSITIVITY)  TROPONIN I (HIGH SENSITIVITY)   ____________________________________________  EKG  EKG read interpreted by me shows normal sinus rhythm rate of 85 normal axis no acute  changes.  Patient does have a prolonged QTC.  Otherwise no acute changes and very similar to EKG from April 2019 ____________________________________________  RADIOLOGY Gertha Calkin, personally viewed and evaluated these images (plain radiographs) as part of my medical decision making, as well as reviewing the written report by the radiologist.  ED MD interpretation: Chest x-ray read by radiology reviewed by me shows no active disease  Official radiology report(s): DG Chest 2 View  Result Date: 10/09/2019 CLINICAL DATA:  Epigastric pain. EXAM: CHEST - 2 VIEW COMPARISON:  CT chest dated January 24, 2019 FINDINGS: There is presumed atelectasis or scarring at the left lung base. This appears to be stable since the patient's prior CT. The heart size is unremarkable. There is no pneumothorax or large pleural effusion. There is no acute osseous abnormality. IMPRESSION: No active cardiopulmonary disease. Electronically Signed   By: Constance Holster M.D.   On: 10/09/2019 03:45    ____________________________________________   PROCEDURES  Procedure(s) performed (including Critical Care):  Procedures   ____________________________________________   INITIAL IMPRESSION / ASSESSMENT AND PLAN / ED COURSE  EKG reviewed with Dr. Rockey Situ as well.  EKG reads QTC is 561 ms which is long.  Again the 2 has looked at this EKG and a previous EKG.  The QT and QTc does not look to be anywhere near as long as the computer is reading it.  In fact it looks as I noted very similar to EKG from April 2019.  That QTC was not prolonged.  Dr. Rockey Situ will follow her up in clinic if she wishes.            ____________________________________________   FINAL CLINICAL IMPRESSION(S) / ED DIAGNOSES  Final diagnoses:  Epigastric pain     ED Discharge Orders    None      *Please note:  Jennifer Huff was evaluated in Emergency Department on 10/09/2019 for the symptoms described in the  history of present illness. She was evaluated in the context of the global COVID-19 pandemic, which necessitated consideration that the patient might be at risk for infection with the SARS-CoV-2  virus that causes COVID-19. Institutional protocols and algorithms that pertain to the evaluation of patients at risk for COVID-19 are in a state of rapid change based on information released by regulatory bodies including the CDC and federal and state organizations. These policies and algorithms were followed during the patient's care in the ED.  Some ED evaluations and interventions may be delayed as a result of limited staffing during and the pandemic.*   Note:  This document was prepared using Dragon voice recognition software and may include unintentional dictation errors.    Nena Polio, MD 10/09/19 1220

## 2019-10-10 ENCOUNTER — Other Ambulatory Visit: Payer: Self-pay

## 2019-10-10 ENCOUNTER — Telehealth: Payer: Self-pay | Admitting: Family

## 2019-10-10 NOTE — Telephone Encounter (Signed)
Just FYI patient was seen for epigastric pain in ED.

## 2019-10-10 NOTE — Telephone Encounter (Signed)
Patient called for a Hospital f/u scheduled for 10-13-19 10:30

## 2019-10-10 NOTE — Telephone Encounter (Signed)
Does not qualify for transitional care management due to standard ED visit. ED visit with observation or admit to hospital will qualify for tcm.

## 2019-10-13 ENCOUNTER — Ambulatory Visit: Payer: Medicare PPO | Admitting: Cardiology

## 2019-10-13 ENCOUNTER — Other Ambulatory Visit: Payer: Self-pay

## 2019-10-13 ENCOUNTER — Ambulatory Visit (INDEPENDENT_AMBULATORY_CARE_PROVIDER_SITE_OTHER): Payer: Medicare PPO | Admitting: Family

## 2019-10-13 ENCOUNTER — Telehealth: Payer: Self-pay | Admitting: Family

## 2019-10-13 ENCOUNTER — Encounter: Payer: Self-pay | Admitting: Family

## 2019-10-13 ENCOUNTER — Other Ambulatory Visit: Payer: Self-pay | Admitting: Oncology

## 2019-10-13 DIAGNOSIS — Z6841 Body Mass Index (BMI) 40.0 and over, adult: Secondary | ICD-10-CM | POA: Diagnosis not present

## 2019-10-13 DIAGNOSIS — E278 Other specified disorders of adrenal gland: Secondary | ICD-10-CM

## 2019-10-13 DIAGNOSIS — I10 Essential (primary) hypertension: Secondary | ICD-10-CM

## 2019-10-13 DIAGNOSIS — K219 Gastro-esophageal reflux disease without esophagitis: Secondary | ICD-10-CM | POA: Diagnosis not present

## 2019-10-13 NOTE — Progress Notes (Signed)
Subjective:    Patient ID: Jennifer Huff, female    DOB: [birth year], 69 y.o.   MRN: [id number]  CC: Jennifer Huff is a 69 y.o. female who presents today for follow up.   HPI: Here today to follow up for epigastric pain to abdominal pain, which has totally resolved. Pain occurred one morning at 7am while watching tv. Describes as radiated between shoulder blades.Hadnt eaten breakfast yet. Day prior had indigestion which she had been drinking ginger ale to treat this.    Dinner was cheeseburger with mustard chilli on it, 7pm. Reports that chilli and mustard will start 'indigestion'. Couple of hours later had bologna and cheese sandwich around 11pm.   H/o GERD however never had pain such as this with 'indigestion'.   No CP , sob, vomiting, nausea, constipation, fever.   Had been taking pepcid prn, started taking since ED visit BID.  No longer on nexium due to CKD.   She does endorse weight gain over the past year. She doesn't formally exercise and 'sits most of the time'  H/o prediabetes  HTN- compliant with  Losartan 50mg  qd , metoprolol 25mg  BID, amlodipine 10 mg qd Follow up with Laurann Montana 10/15/19 Seen in ED for epigastric  Pain 4 days ago. EKG raised concern for prolonged QTc, 561,Dr malinda consulted with Dr Rockey Situ, whom advised follow up if she desired  CXR no acute disease Troponin x2 negative Hemoglobin 11.1- improved from prior 10  Crt 1.41  Dr Erlene Quan adrenal mass - suspected adrenal adenoma and advised to repeat MRI in one year.  Following with Dr Tasia Catchings for microcytic anemia- 3 weeks ago. Decided to continue iron supplementation instead of venofer IV  CKD- follows with nephrology, Dr Royce Macadamia. Colonoscopy 05/06/19  HISTORY:  Past Medical History:  Diagnosis Date  . Cataract   . GERD (gastroesophageal reflux disease)   . HTN (hypertension)   . Hypercholesteremia   . Vitamin D deficiency    Past Surgical History:  Procedure Laterality Date  .  COLONOSCOPY WITH PROPOFOL N/A 05/06/2019   Procedure: COLONOSCOPY WITH PROPOFOL;  Surgeon: Virgel Manifold, MD;  Location: [facility] ENDOSCOPY;  Service: Endoscopy;  Laterality: N/A;  . KNEE ARTHROSCOPY WITH LATERAL MENISECTOMY Right 11/06/2017   Procedure: KNEE ARTHROSCOPY WITH REPAIR LATERAL MENISCAL ROOT TEAR;  Surgeon: Corky Mull, MD;  Location: [facility] ORS;  Service: Orthopedics;  Laterality: Right;  . LAPAROSCOPIC HYSTERECTOMY     Family History  Problem Relation Age of Onset  . Kidney disease Father   . Heart disease Father        died from mi  . Cancer Father        prostate  . Hypertension Sister   . Colon polyps Sister   . Hypertension Brother   . Hypertension Sister   . Diabetes Mother   . Hypertension Mother   . Cancer Paternal Grandmother        head and neck  . Hypertension Brother   . Cancer Son   . Colon cancer Neg Hx   . Breast cancer Neg Hx     Allergies: Ace inhibitors and Codeine Current Outpatient Medications on File Prior to Visit  Medication Sig Dispense Refill  . acetaminophen (TYLENOL) 500 MG tablet Take 1,000 mg by mouth daily as needed for mild pain.    Marland Kitchen amLODipine (NORVASC) 10 MG tablet Take 1 tablet (10 mg total) by mouth daily. 90 tablet 0  . Cholecalciferol (VITAMIN D3) 2000 units TABS Take 2,000  Units by mouth at bedtime.     . docusate sodium (COLACE) 100 MG capsule Take 1 capsule (100 mg total) by mouth daily. 90 capsule 0  . famotidine (PEPCID) 20 MG tablet Take 1 tablet (20 mg total) by mouth 2 (two) times daily as needed for heartburn or indigestion. 120 tablet 1  . ferrous sulfate 325 (65 FE) MG EC tablet Take 1 tablet (325 mg total) by mouth 2 (two) times daily with a meal. 60 tablet 2  . ketorolac (ACULAR) 0.4 % SOLN Place 1 drop into the left eye 2 (two) times daily as needed (eye pain). 5 mL 6  . losartan (COZAAR) 50 MG tablet Take 1 tablet (50 mg total) by mouth daily. 90 tablet 3  . metoprolol tartrate (LOPRESSOR) 25 MG tablet Take 1  tablet (25 mg total) by mouth 2 (two) times daily. 120 tablet 1  . polyethylene glycol powder (GLYCOLAX/MIRALAX) 17 GM/SCOOP powder Take 1 Container by mouth as needed. Take one capful mix into water as needed for constipation.    . pravastatin (PRAVACHOL) 40 MG tablet Take 1 tablet (40 mg total) by mouth daily. 90 tablet 0  . calcium carbonate (TUMS - DOSED IN MG ELEMENTAL CALCIUM) 500 MG chewable tablet Chew 2 tablets by mouth daily as needed for indigestion or heartburn. (Patient not taking: Reported on 10/13/2019)     No current facility-administered medications on file prior to visit.    Social History   Tobacco Use  . Smoking status: Former Smoker    Types: Cigarettes    Quit date: 12/07/1997    Years since quitting: 21.8  . Smokeless tobacco: Never Used  Vaping Use  . Vaping Use: Never used  Substance Use Topics  . Alcohol use: Not Currently  . Drug use: No    Review of Systems  Constitutional: Negative for chills and fever.  Respiratory: Negative for cough.   Cardiovascular: Negative for chest pain, palpitations and leg swelling.  Gastrointestinal: Negative for nausea and vomiting.      Objective:    BP 122/82   Pulse 73   Temp 98.3 F (36.8 C)   Ht 5\' 6"  (1.676 m)   Wt 270 lb 9.6 oz (122.7 kg)   SpO2 97%   BMI 43.68 kg/m  BP Readings from Last 3 Encounters:  10/13/19 122/82  10/09/19 124/82  09/25/19 (!) 141/84   Wt Readings from Last 3 Encounters:  10/13/19 270 lb 9.6 oz (122.7 kg)  10/09/19 268 lb (121.6 kg)  09/25/19 268 lb 9.6 oz (121.8 kg)   249 lbs 04/2018  Physical Exam Vitals reviewed.  Constitutional:      Appearance: She is well-developed.  Eyes:     Conjunctiva/sclera: Conjunctivae normal.  Cardiovascular:     Rate and Rhythm: Normal rate and regular rhythm.     Pulses: Normal pulses.     Heart sounds: Normal heart sounds.  Pulmonary:     Effort: Pulmonary effort is normal.     Breath sounds: Normal breath sounds. No wheezing, rhonchi  or rales.  Skin:    General: Skin is warm and dry.  Neurological:     Mental Status: She is alert.  Psychiatric:        Speech: Speech normal.        Behavior: Behavior normal.        Thought Content: Thought content normal.        Assessment & Plan:   Problem List Items Addressed This Visit  Cardiovascular and Mediastinum   Hypertension    Stable. Continue  Losartan 50mg  qd , metoprolol 25mg  BID, amlodipine 10 mg qd        Digestive   GERD (gastroesophageal reflux disease)    Resolved at this time. Description and presentation of epigastric, abdominal pain which radiated to scapula area most consistent with GERD. She will continue pepcid AC OTC.      Relevant Medications   polyethylene glycol powder (GLYCOLAX/MIRALAX) 17 GM/SCOOP powder     Other   Adrenal mass ([place])    Due for surveillance with repeat MRI abdomen 12/2019; she will call Dr Cherrie Gauze office for this and I have provided her with number.  No HTN,  hypokalemia. She has had weight gain, which may be related to lifestyle versus hormone hypersecretion.   As she has not had evaluation for hormonal hypersecretion. I have messaged Dr Kelton Pillar in regards to completing this work up in the absence of overt clinical symptoms.       BMI 40.0-44.9, adult ([place])    Discussed starting walking program today. I advised consideration of weight loss medication such as saxenda however with caution in setting of CKD. She is not a candidate for metformin.  Lab Results  Component Value Date   HGBA1C 6.0 (H) 09/16/2019             I am having [name]. [name] maintain her Vitamin D3, acetaminophen, calcium carbonate, ketorolac, ferrous sulfate, pravastatin, amLODipine, famotidine, metoprolol tartrate, losartan, docusate sodium, and polyethylene glycol powder.   No orders of the defined types were placed in this encounter.   Return precautions given.   Risks, benefits, and alternatives of the medications and  treatment plan prescribed today were discussed, and patient expressed understanding.   Education regarding symptom management and diagnosis given to patient on AVS.  Continue to follow with Burnard Hawthorne, FNP for routine health maintenance.   Loetta Rough and I agreed with plan.   Mable Paris, FNP

## 2019-10-13 NOTE — Telephone Encounter (Signed)
Call pt Please advise that I did  Hear from endocrine and they strongly advised hormonal evaluation due to adrenal mass. Referral has been placed Let us know if you dont hear back within a week in regards to an appointment being scheduled.

## 2019-10-13 NOTE — Assessment & Plan Note (Signed)
Stable. Continue  Losartan 50mg  qd , metoprolol 25mg  BID, amlodipine 10 mg qd

## 2019-10-13 NOTE — Patient Instructions (Addendum)
Glad you are feeling better  Call dr brandon's office and ensure MRI abdomen is scheduled for 12/2019 to follow adrenal mass- [phone]  I will let you know about seeing endocrine and if necessary for functional work up of adrenal gland.   Stay safe

## 2019-10-13 NOTE — Assessment & Plan Note (Signed)
Discussed starting walking program today. I advised consideration of weight loss medication such as saxenda however with caution in setting of CKD. She is not a candidate for metformin.  Lab Results  Component Value Date   HGBA1C 6.0 (H) 09/16/2019

## 2019-10-13 NOTE — Assessment & Plan Note (Signed)
Resolved at this time. Description and presentation of epigastric, abdominal pain which radiated to scapula area most consistent with GERD. She will continue pepcid AC OTC.

## 2019-10-13 NOTE — Assessment & Plan Note (Addendum)
Due for surveillance with repeat MRI abdomen 12/2019; she will call Dr Cherrie Gauze office for this and I have provided her with number.  No HTN,  hypokalemia. She has had weight gain, which may be related to lifestyle versus hormone hypersecretion.   As she has not had evaluation for hormonal hypersecretion. I have messaged Dr Kelton Pillar in regards to completing this work up in the absence of overt clinical symptoms.

## 2019-10-13 NOTE — Telephone Encounter (Signed)
I called patient & she will let us know if she does not hear in regards to appointment.

## 2019-10-14 ENCOUNTER — Ambulatory Visit (INDEPENDENT_AMBULATORY_CARE_PROVIDER_SITE_OTHER): Payer: Medicare PPO

## 2019-10-14 VITALS — Ht 66.0 in | Wt 270.0 lb

## 2019-10-14 DIAGNOSIS — Z0000 Encounter for general adult medical examination without abnormal findings: Secondary | ICD-10-CM | POA: Diagnosis not present

## 2019-10-14 NOTE — Progress Notes (Addendum)
Subjective:   Hollyanne Schloesser is a 69 y.o. female who presents for Medicare Annual (Subsequent) preventive examination.  Review of Systems    No ROS.  Medicare Wellness Virtual Visit.    Cardiac Risk Factors include: advanced age (>50men, >66 women);hypertension     Objective:    Today's Vitals   10/14/19 1136  Weight: 270 lb (122.5 kg)  Height: 5\' 6"  (1.676 m)   Body mass index is 43.58 kg/m.  Advanced Directives 10/14/2019 09/25/2019 06/23/2019 05/06/2019 02/12/2019 02/05/2019 01/09/2019  Does Patient Have a Medical Advance Directive? No No No No No No No  Would patient like information on creating a medical advance directive? No - Patient declined - Yes (MAU/Ambulatory/Procedural Areas - Information given) - No - Patient declined - -    Current Medications (verified) Outpatient Encounter Medications as of 10/14/2019  Medication Sig  . acetaminophen (TYLENOL) 500 MG tablet Take 1,000 mg by mouth daily as needed for mild pain.  Marland Kitchen amLODipine (NORVASC) 10 MG tablet Take 1 tablet (10 mg total) by mouth daily.  . calcium carbonate (TUMS - DOSED IN MG ELEMENTAL CALCIUM) 500 MG chewable tablet Chew 2 tablets by mouth daily as needed for indigestion or heartburn. (Patient not taking: Reported on 10/13/2019)  . Cholecalciferol (VITAMIN D3) 2000 units TABS Take 2,000 Units by mouth at bedtime.   . docusate sodium (COLACE) 100 MG capsule Take 1 capsule (100 mg total) by mouth daily.  . famotidine (PEPCID) 20 MG tablet Take 1 tablet (20 mg total) by mouth 2 (two) times daily as needed for heartburn or indigestion.  . FEROSUL 325 (65 Fe) MG tablet Take 1 tablet (325 mg total) by mouth 2 (two) times daily with a meal.  . ketorolac (ACULAR) 0.4 % SOLN Place 1 drop into the left eye 2 (two) times daily as needed (eye pain).  Marland Kitchen losartan (COZAAR) 50 MG tablet Take 1 tablet (50 mg total) by mouth daily.  . metoprolol tartrate (LOPRESSOR) 25 MG tablet Take 1 tablet (25 mg total) by mouth 2 (two)  times daily.  . polyethylene glycol powder (GLYCOLAX/MIRALAX) 17 GM/SCOOP powder Take 1 Container by mouth as needed. Take one capful mix into water as needed for constipation.  . pravastatin (PRAVACHOL) 40 MG tablet Take 1 tablet (40 mg total) by mouth daily.   No facility-administered encounter medications on file as of 10/14/2019.    Allergies (verified) Ace inhibitors and Codeine   History: Past Medical History:  Diagnosis Date  . Cataract   . GERD (gastroesophageal reflux disease)   . HTN (hypertension)   . Hypercholesteremia   . Vitamin D deficiency    Past Surgical History:  Procedure Laterality Date  . COLONOSCOPY WITH PROPOFOL N/A 05/06/2019   Procedure: COLONOSCOPY WITH PROPOFOL;  Surgeon: Virgel Manifold, MD;  Location: [facility] ENDOSCOPY;  Service: Endoscopy;  Laterality: N/A;  . KNEE ARTHROSCOPY WITH LATERAL MENISECTOMY Right 11/06/2017   Procedure: KNEE ARTHROSCOPY WITH REPAIR LATERAL MENISCAL ROOT TEAR;  Surgeon: Corky Mull, MD;  Location: [facility] ORS;  Service: Orthopedics;  Laterality: Right;  . LAPAROSCOPIC HYSTERECTOMY     Family History  Problem Relation Age of Onset  . Kidney disease Father   . Heart disease Father        died from mi  . Cancer Father        prostate  . Hypertension Sister   . Colon polyps Sister   . Hypertension Brother   . Hypertension Sister   . Diabetes  Mother   . Hypertension Mother   . Cancer Paternal Grandmother        head and neck  . Hypertension Brother   . Cancer Son   . Colon cancer Neg Hx   . Breast cancer Neg Hx    Social History   Socioeconomic History  . Marital status: Married    Spouse name: Not on file  . Number of children: 3  . Years of education: Not on file  . Highest education level: Not on file  Occupational History  . 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 Smoking status: Former Smoker    Types: Cigarettes    Quit date: 12/07/1997    Years since quitting: 21.8  . Smokeless  tobacco: Never Used  Vaping Use  . Vaping Use: Never used  Substance and Sexual Activity  . Alcohol use: Not Currently  . Drug use: No  . Sexual activity: Not Currently  Other Topics Concern  . Not on file  Social History Narrative   Regular Exercise -  NO   Daily Caffeine Use:  2-3 soda/sw tea      Retired   Scientist, physiological Strain: Low Risk   . Difficulty of Paying Living Expenses: Not hard at all  Food Insecurity: No Food Insecurity  . Worried About Charity fundraiser in the Last Year: Never true  . Ran Out of Food in the Last Year: Never true  Transportation Needs: No Transportation Needs  . Lack of Transportation (Medical): No  . Lack of Transportation (Non-Medical): No  Physical Activity:   . Days of Exercise per Week: Not on file  . Minutes of Exercise per Session: Not on file  Stress: No Stress Concern Present  . Feeling of Stress : Not at all  Social Connections: Unknown  . Frequency of Communication with Friends and Family: More than three times a week  . Frequency of Social Gatherings with Friends and Family: More than three times a week  . Attends Religious Services: Not on file  . Active Member of Clubs or Organizations: Not on file  . Attends Archivist Meetings: Not on file  . Marital Status: Not on file    Tobacco Counseling Counseling given: Not Answered   Clinical Intake:  Pre-visit preparation completed: Yes        Diabetes: No  How often do you need to have someone help you when you read instructions, pamphlets, or other written materials from your doctor or pharmacy?: 1 - Never Interpreter Needed?: No      Activities of Daily Living In your present state of health, do you have any difficulty performing the following activities: 10/14/2019  Hearing? N  Vision? N  Difficulty concentrating or making decisions? N  Walking or climbing stairs? N  Dressing or bathing? N  Doing errands,  shopping? N  Preparing Food and eating ? N  Using the Toilet? N  In the past six months, have you accidently leaked urine? N  Do you have problems with loss of bowel control? N  Managing your Medications? N  Managing your Finances? N  Housekeeping or managing your Housekeeping? N  Some recent data might be hidden    Patient Care Team: Burnard Hawthorne, FNP as PCP - General (Family Medicine) Clent Jacks, RN as Oncology Nurse Navigator  Indicate any recent Medical Services you may have received from other than [facility] providers in  the past year (date may be approximate).     Assessment:   This is a routine wellness examination for [place].  I connected with Barbi today by telephone and verified that I am speaking with the correct person using two identifiers. Location patient: home Location provider: work Persons participating in the virtual visit: patient, Marine scientist.    I discussed the limitations, risks, security and privacy concerns of performing an evaluation and management service by telephone and the availability of in person appointments. [id number] patient expressed understanding and verbally consented to this telephonic visit.    Interactive audio and video telecommunications were attempted between this provider and patient, however failed, due to patient having technical difficulties OR patient did not have access to video capability.  We continued and completed visit with audio only.  Some vital signs may be absent or patient reported.   Hearing/Vision screen  Hearing Screening   125Hz  250Hz  500Hz  1000Hz  2000Hz  3000Hz  4000Hz  6000Hz  8000Hz   Right ear:           Left ear:           Comments: Patient is able to hear conversational tones without difficulty. No issues reported.  Vision Screening Comments: Wears corrective lenses  Visual acuity not assessed, virtual visit. They have seen their ophthalmologist in the last 12 months.  Dietary issues and exercise activities  discussed: Current Exercise Habits: Home exercise routine, Type of exercise: walking, Intensity: Mild  Healthy diet Good water intake  Goals      Patient Stated   .  Increase physical activity (pt-stated)      I will start walking more for exercise.       Depression Screen PHQ 2/9 Scores 10/14/2019 09/16/2019 01/08/2019 10/11/2018 07/29/2018 07/13/2016 12/29/2015  PHQ - 2 Score 0 0 0 0 0 0 0  PHQ- 9 Score 0 0 1 - 0 - -    Fall Risk Fall Risk  10/14/2019 08/13/2019 10/11/2018 07/13/2016 12/29/2015  Falls in the past year? 0 0 0 No No  Number falls in past yr: 0 0 - - -  Injury with Fall? - 0 - - -  Follow up Falls evaluation completed Falls evaluation completed - - -   Handrails in use when climbing stairs? Yes Home free of loose throw rugs in walkways, pet beds, electrical cords, etc? Yes  Adequate lighting in your home to reduce risk of falls? Yes   ASSISTIVE DEVICES UTILIZED TO PREVENT FALLS: Use of a cane, walker or w/c? No   TIMED UP AND GO: Was the test performed? No . Virtual visit.     Cognitive Function:  Patient is alert and oriented x3.  Denies difficulty focusing, making decisions, memory loss.    6CIT Screen 10/11/2018  What Year? 0 points  What month? 0 points  What time? 0 points  Count back from 20 0 points  Months in reverse 0 points  Repeat phrase 0 points  Total Score 0    Immunizations Immunization History  Administered Date(s) Administered  . Fluad Quad(high Dose 65+) 09/28/2018, 09/16/2019  . Influenza Split 09/08/2010  . Influenza,inj,Quad PF,6+ Mos 10/19/2016  . Influenza-Unspecified 10/10/2011, 10/18/2012, 10/17/2013, 10/23/2014, 10/22/2015, 11/05/2017  . Moderna SARS-COVID-2 Vaccination 03/06/2019, 04/03/2019  . Pneumococcal Conjugate-13 04/18/2017, 09/16/2019  . Tdap 06/09/2011  . Zoster 09/17/2011  . Zoster Recombinat (Shingrix) 06/11/2017, 09/06/2017    Health Maintenance There are no preventive care reminders to display for this  patient. Health Maintenance  Topic Date Due  . PNA  vac Low Risk Adult (2 of 2 - PPSV23) 09/15/2020  . TETANUS/TDAP  06/08/2021  . MAMMOGRAM  09/03/2021  . COLONOSCOPY  05/05/2024  . INFLUENZA VACCINE  Completed  . DEXA SCAN  Completed  . COVID-19 Vaccine  Completed  . Hepatitis C Screening  Completed   Dental Screening: Recommended annual dental exams for proper oral hygiene  Community Resource Referral / Chronic Care Management: CRR required this visit?  No   CCM required this visit?  No      Plan:   Keep all routine maintenance appointments.   Follow up 12/16/19 @ 10:00  Follow up 02/13/20 @ 8:30  I have personally reviewed and noted the following in the patient's chart:   . Medical and social history . Use of alcohol, tobacco or illicit drugs  . Current medications and supplements . Functional ability and status . Nutritional status . Physical activity . Advanced directives . List of other physicians . Hospitalizations, surgeries, and ER visits in previous 12 months . Vitals . Screenings to include cognitive, depression, and falls . Referrals and appointments  In addition, I have reviewed and discussed with patient certain preventive protocols, quality metrics, and best practice recommendations. A written personalized care plan for preventive services as well as general preventive health recommendations were provided to patient via mychart.     Varney Biles, LPN   [id number]   Agree with plan. Mable Paris, NP

## 2019-10-14 NOTE — Patient Instructions (Addendum)
Jennifer Huff , Thank you for taking time to come for your Medicare Wellness Visit. I appreciate your ongoing commitment to your health goals. Please review the following plan we discussed and let me know if I can assist you in the future.   These are the goals we discussed: Goals      Patient Stated   .  Increase physical activity (pt-stated)      I will start walking more for exercise.        This is a list of the screening recommended for you and due dates:  Health Maintenance  Topic Date Due  . Pneumonia vaccines (2 of 2 - PPSV23) 09/15/2020  . Tetanus Vaccine  06/08/2021  . Mammogram  09/03/2021  . Colon Cancer Screening  05/05/2024  . Flu Shot  Completed  . DEXA scan (bone density measurement)  Completed  . COVID-19 Vaccine  Completed  .  Hepatitis C: One time screening is recommended by Center for Disease Control  (CDC) for  adults born from [birth year] through 1964.   Completed    Immunizations Immunization History  Administered Date(s) Administered  . Fluad Quad(high Dose 65+) 09/28/2018, 09/16/2019  . Influenza Split 09/08/2010  . Influenza,inj,Quad PF,6+ Mos 10/19/2016  . Influenza-Unspecified 10/10/2011, 10/18/2012, 10/17/2013, 10/23/2014, 10/22/2015, 11/05/2017  . Moderna SARS-COVID-2 Vaccination 03/06/2019, 04/03/2019  . Pneumococcal Conjugate-13 04/18/2017, 09/16/2019  . Tdap 06/09/2011  . Zoster 09/17/2011  . Zoster Recombinat (Shingrix) 06/11/2017, 09/06/2017   Keep all routine maintenance appointments.   Follow up 12/16/19 @ 10:00  Follow up 02/13/20 @ 8:30  Advanced directives: not yet completed  Conditions/risks identified: none new  Follow up in one year for your annual wellness visit.    Preventive Care 68 Years and Older, Female Preventive care refers to lifestyle choices and visits with your health care provider that can promote health and wellness. What does preventive care include?  A yearly physical exam. This is also called an annual well  check.  Dental exams once or twice a year.  Routine eye exams. Ask your health care provider how often you should have your eyes checked.  Personal lifestyle choices, including:  Daily care of your teeth and gums.  Regular physical activity.  Eating a healthy diet.  Avoiding tobacco and drug use.  Limiting alcohol use.  Practicing safe sex.  Taking low-dose aspirin every day.  Taking vitamin and mineral supplements as recommended by your health care provider. What happens during an annual well check? The services and screenings done by your health care provider during your annual well check will depend on your age, overall health, lifestyle risk factors, and family history of disease. Counseling  Your health care provider may ask you questions about your:  Alcohol use.  Tobacco use.  Drug use.  Emotional well-being.  Home and relationship well-being.  Sexual activity.  Eating habits.  History of falls.  Memory and ability to understand (cognition).  Work and work Statistician.  Reproductive health. Screening  You may have the following tests or measurements:  Height, weight, and BMI.  Blood pressure.  Lipid and cholesterol levels. These may be checked every 5 years, or more frequently if you are over 7 years old.  Skin check.  Lung cancer screening. You may have this screening every year starting at age 27 if you have a 30-pack-year history of smoking and currently smoke or have quit within the past 15 years.  Fecal occult blood test (FOBT) of the stool. You may have this  test every year starting at age 106.  Flexible sigmoidoscopy or colonoscopy. You may have a sigmoidoscopy every 5 years or a colonoscopy every 10 years starting at age 63.  Hepatitis C blood test.  Hepatitis B blood test.  Sexually transmitted disease (STD) testing.  Diabetes screening. This is done by checking your blood sugar (glucose) after you have not eaten for a while  (fasting). You may have this done every 1-3 years.  Bone density scan. This is done to screen for osteoporosis. You may have this done starting at age 37.  Mammogram. This may be done every 1-2 years. Talk to your health care provider about how often you should have regular mammograms. Talk with your health care provider about your test results, treatment options, and if necessary, the need for more tests. Vaccines  Your health care provider may recommend certain vaccines, such as:  Influenza vaccine. This is recommended every year.  Tetanus, diphtheria, and acellular pertussis (Tdap, Td) vaccine. You may need a Td booster every 10 years.  Zoster vaccine. You may need this after age 40.  Pneumococcal 13-valent conjugate (PCV13) vaccine. One dose is recommended after age 49.  Pneumococcal polysaccharide (PPSV23) vaccine. One dose is recommended after age 32. Talk to your health care provider about which screenings and vaccines you need and how often you need them. This information is not intended to replace advice given to you by your health care provider. Make sure you discuss any questions you have with your health care provider. Document Released: 01/15/2015 Document Revised: 09/08/2015 Document Reviewed: 10/20/2014 Elsevier Interactive Patient Education  2017 [employer] Prevention in the Home Falls can cause injuries. They can happen to people of all ages. There are many things you can do to make your home safe and to help prevent falls. What can I do on the outside of my home?  Regularly fix the edges of walkways and driveways and fix any cracks.  Remove anything that might make you trip as you walk through a door, such as a raised step or threshold.  Trim any bushes or trees on the path to your home.  Use bright outdoor lighting.  Clear any walking paths of anything that might make someone trip, such as rocks or tools.  Regularly check to see if handrails are loose  or broken. Make sure that both sides of any steps have handrails.  Any raised decks and porches should have guardrails on the edges.  Have any leaves, snow, or ice cleared regularly.  Use sand or salt on walking paths during winter.  Clean up any spills in your garage right away. This includes oil or grease spills. What can I do in the bathroom?  Use night lights.  Install grab bars by the toilet and in the tub and shower. Do not use towel bars as grab bars.  Use non-skid mats or decals in the tub or shower.  If you need to sit down in the shower, use a plastic, non-slip stool.  Keep the floor dry. Clean up any water that spills on the floor as soon as it happens.  Remove soap buildup in the tub or shower regularly.  Attach bath mats securely with double-sided non-slip rug tape.  Do not have throw rugs and other things on the floor that can make you trip. What can I do in the bedroom?  Use night lights.  Make sure that you have a light by your bed that is easy to reach.  Do not use any sheets or blankets that are too big for your bed. They should not hang down onto the floor.  Have a firm chair that has side arms. You can use this for support while you get dressed.  Do not have throw rugs and other things on the floor that can make you trip. What can I do in the kitchen?  Clean up any spills right away.  Avoid walking on wet floors.  Keep items that you use a lot in easy-to-reach places.  If you need to reach something above you, use a strong step stool that has a grab bar.  Keep electrical cords out of the way.  Do not use floor polish or wax that makes floors slippery. If you must use wax, use non-skid floor wax.  Do not have throw rugs and other things on the floor that can make you trip. What can I do with my stairs?  Do not leave any items on the stairs.  Make sure that there are handrails on both sides of the stairs and use them. Fix handrails that are  broken or loose. Make sure that handrails are as long as the stairways.  Check any carpeting to make sure that it is firmly attached to the stairs. Fix any carpet that is loose or worn.  Avoid having throw rugs at the top or bottom of the stairs. If you do have throw rugs, attach them to the floor with carpet tape.  Make sure that you have a light switch at the top of the stairs and the bottom of the stairs. If you do not have them, ask someone to add them for you. What else can I do to help prevent falls?  Wear shoes that:  Do not have high heels.  Have rubber bottoms.  Are comfortable and fit you well.  Are closed at the toe. Do not wear sandals.  If you use a stepladder:  Make sure that it is fully opened. Do not climb a closed stepladder.  Make sure that both sides of the stepladder are locked into place.  Ask someone to hold it for you, if possible.  Clearly mark and make sure that you can see:  Any grab bars or handrails.  First and last steps.  Where the edge of each step is.  Use tools that help you move around (mobility aids) if they are needed. These include:  Canes.  Walkers.  Scooters.  Crutches.  Turn on the lights when you go into a dark area. Replace any light bulbs as soon as they burn out.  Set up your furniture so you have a clear path. Avoid moving your furniture around.  If any of your floors are uneven, fix them.  If there are any pets around you, be aware of where they are.  Review your medicines with your doctor. Some medicines can make you feel dizzy. This can increase your chance of falling. Ask your doctor what other things that you can do to help prevent falls. This information is not intended to replace advice given to you by your health care provider. Make sure you discuss any questions you have with your health care provider. Document Released: 10/15/2008 Document Revised: 05/27/2015 Document Reviewed: 01/23/2014 Elsevier  Interactive Patient Education  2017 [employer].

## 2019-10-15 ENCOUNTER — Ambulatory Visit (INDEPENDENT_AMBULATORY_CARE_PROVIDER_SITE_OTHER): Payer: Medicare PPO | Admitting: Family

## 2019-10-15 ENCOUNTER — Encounter: Payer: Self-pay | Admitting: Family

## 2019-10-15 ENCOUNTER — Other Ambulatory Visit: Payer: Self-pay

## 2019-10-15 VITALS — BP 128/72 | HR 73 | Ht 66.5 in | Wt 269.2 lb

## 2019-10-15 DIAGNOSIS — I10 Essential (primary) hypertension: Secondary | ICD-10-CM

## 2019-10-15 DIAGNOSIS — E782 Mixed hyperlipidemia: Secondary | ICD-10-CM

## 2019-10-15 NOTE — Patient Instructions (Signed)
Medication Instructions:  NO medication changes today.   *If you need a refill on your cardiac medications before your next appointment, please call your pharmacy*   Lab Work: No lab work today.  Your lab work in the hospital was reassuring.   Testing/Procedures: Your EKG today showed normal sinus rhythm which is a great result!  Follow-Up: At [facility], you and your health needs are our priority.  As part of our continuing mission to provide you with exceptional heart care, we have created designated Provider Care Teams.  These Care Teams include your primary Cardiologist (physician) and Advanced Practice Providers (APPs -  Physician Assistants and Nurse Practitioners) who all work together to provide you with the care you need, when you need it.  We recommend signing up for the patient portal called "MyChart".  Sign up information is provided on this After Visit Summary.  MyChart is used to connect with patients for Virtual Visits (Telemedicine).  Patients are able to view lab/test results, encounter notes, upcoming appointments, etc.  Non-urgent messages can be sent to your provider as well.   To learn more about what you can do with MyChart, go to [email or web address].    Your next appointment:   1 year(s)  The format for your next appointment:   In Person  Provider:   You may see Ida Rogue, MD or one of the following Advanced Practice Providers on your designated Care Team:    Murray Hodgkins, NP  Christell Faith, PA-C  Marrianne Mood, PA-C  Cadence Kathlen Mody, [place]  Other Instructions  Recommend starting a walking regimen.    Heart-Healthy Eating Plan Heart-healthy meal planning includes:  Eating less unhealthy fats.  Eating more healthy fats.  Making other changes in your diet. Talk with your doctor or a diet specialist (dietitian) to create an eating plan that is right for you. What are tips for following this plan? Cooking Avoid frying your food. Try  to bake, boil, grill, or broil it instead. You can also reduce fat by:  Removing the skin from poultry.  Removing all visible fats from meats.  Steaming vegetables in water or broth. Meal planning   At meals, divide your plate into four equal parts: ? Fill one-half of your plate with vegetables and green salads. ? Fill one-fourth of your plate with whole grains. ? Fill one-fourth of your plate with lean protein foods.  Eat 4-5 servings of vegetables per day. A serving of vegetables is: ? 1 cup of raw or cooked vegetables. ? 2 cups of raw leafy greens.  Eat 4-5 servings of fruit per day. A serving of fruit is: ? 1 medium whole fruit. ?  cup of dried fruit. ?  cup of fresh, frozen, or canned fruit. ?  cup of 100% fruit juice.  Eat more foods that have soluble fiber. These are apples, broccoli, carrots, beans, peas, and barley. Try to get 20-30 g of fiber per day.  Eat 4-5 servings of nuts, legumes, and seeds per week: ? 1 serving of dried beans or legumes equals  cup after being cooked. ? 1 serving of nuts is  cup. ? 1 serving of seeds equals 1 tablespoon. General information  Eat more home-cooked food. Eat less restaurant, buffet, and fast food.  Limit or avoid alcohol.  Limit foods that are high in starch and sugar.  Avoid fried foods.  Lose weight if you are overweight.  Keep track of how much salt (sodium) you eat. This is important if you  have high blood pressure. Ask your doctor to tell you more about this.  Try to add vegetarian meals each week. Fats  Choose healthy fats. These include olive oil and canola oil, flaxseeds, walnuts, almonds, and seeds.  Eat more omega-3 fats. These include salmon, mackerel, sardines, tuna, flaxseed oil, and ground flaxseeds. Try to eat fish at least 2 times each week.  Check food labels. Avoid foods with trans fats or high amounts of saturated fat.  Limit saturated fats. ? These are often found in animal products, such  as meats, butter, and cream. ? These are also found in plant foods, such as palm oil, palm kernel oil, and coconut oil.  Avoid foods with partially hydrogenated oils in them. These have trans fats. Examples are stick margarine, some tub margarines, cookies, crackers, and other baked goods. What foods can I eat? Fruits All fresh, canned (in natural juice), or frozen fruits. Vegetables Fresh or frozen vegetables (raw, steamed, roasted, or grilled). Green salads. Grains Most grains. Choose whole wheat and whole grains most of the time. Rice and pasta, including brown rice and pastas made with whole wheat. Meats and other proteins Lean, well-trimmed beef, veal, pork, and lamb. Chicken and [place] without skin. All fish and shellfish. Wild duck, rabbit, pheasant, and venison. Egg whites or low-cholesterol egg substitutes. Dried beans, peas, lentils, and tofu. Seeds and most nuts. Dairy Low-fat or nonfat cheeses, including ricotta and mozzarella. Skim or 1% milk that is liquid, powdered, or evaporated. Buttermilk that is made with low-fat milk. Nonfat or low-fat yogurt. Fats and oils Non-hydrogenated (trans-free) margarines. Vegetable oils, including soybean, sesame, sunflower, olive, peanut, safflower, corn, canola, and cottonseed. Salad dressings or mayonnaise made with a vegetable oil. Beverages Mineral water. Coffee and tea. Diet carbonated beverages. Sweets and desserts Sherbet, gelatin, and fruit ice. Small amounts of dark chocolate. Limit all sweets and desserts. Seasonings and condiments All seasonings and condiments. The items listed above may not be a complete list of foods and drinks you can eat. Contact a dietitian for more options. What foods should I avoid? Fruits Canned fruit in heavy syrup. Fruit in cream or butter sauce. Fried fruit. Limit coconut. Vegetables Vegetables cooked in cheese, cream, or butter sauce. Fried vegetables. Grains Breads that are made with saturated or  trans fats, oils, or whole milk. Croissants. Sweet rolls. Donuts. High-fat crackers, such as cheese crackers. Meats and other proteins Fatty meats, such as hot dogs, ribs, sausage, bacon, rib-eye roast or steak. High-fat deli meats, such as salami and bologna. Caviar. Domestic duck and goose. Organ meats, such as liver. Dairy Cream, sour cream, cream cheese, and creamed cottage cheese. Whole-milk cheeses. Whole or 2% milk that is liquid, evaporated, or condensed. Whole buttermilk. Cream sauce or high-fat cheese sauce. Yogurt that is made from whole milk. Fats and oils Meat fat, or shortening. Cocoa butter, hydrogenated oils, palm oil, coconut oil, palm kernel oil. Solid fats and shortenings, including bacon fat, salt pork, lard, and butter. Nondairy cream substitutes. Salad dressings with cheese or sour cream. Beverages Regular sodas and juice drinks with added sugar. Sweets and desserts Frosting. Pudding. Cookies. Cakes. Pies. Milk chocolate or white chocolate. Buttered syrups. Full-fat ice cream or ice cream drinks. The items listed above may not be a complete list of foods and drinks to avoid. Contact a dietitian for more information. Summary  Heart-healthy meal planning includes eating less unhealthy fats, eating more healthy fats, and making other changes in your diet.  Eat a balanced diet. This  includes fruits and vegetables, low-fat or nonfat dairy, lean protein, nuts and legumes, whole grains, and heart-healthy oils and fats. This information is not intended to replace advice given to you by your health care provider. Make sure you discuss any questions you have with your health care provider. Document Revised: 02/22/2017 Document Reviewed: 01/26/2017 Elsevier Patient Education  2020 [employer].

## 2019-10-15 NOTE — Progress Notes (Signed)
Office Visit    Patient Name: Jennifer Huff Date of Encounter: 10/15/2019  Primary Care Provider:  Burnard Hawthorne, FNP Primary Cardiologist:  Ida Rogue, MD Electrophysiologist:  None   Chief Complaint    Jennifer Huff is a 69 y.o. female with a hx of HTN, depression, HLD, GERD, previous tobacco use, family history of coronary disease presents today for ED follow-up  Past Medical History    Past Medical History:  Diagnosis Date  . Cataract   . GERD (gastroesophageal reflux disease)   . HTN (hypertension)   . Hypercholesteremia   . Vitamin D deficiency    Past Surgical History:  Procedure Laterality Date  . COLONOSCOPY WITH PROPOFOL N/A 05/06/2019   Procedure: COLONOSCOPY WITH PROPOFOL;  Surgeon: Virgel Manifold, MD;  Location: [facility] ENDOSCOPY;  Service: Endoscopy;  Laterality: N/A;  . KNEE ARTHROSCOPY WITH LATERAL MENISECTOMY Right 11/06/2017   Procedure: KNEE ARTHROSCOPY WITH REPAIR LATERAL MENISCAL ROOT TEAR;  Surgeon: Corky Mull, MD;  Location: [facility] ORS;  Service: Orthopedics;  Laterality: Right;  . LAPAROSCOPIC HYSTERECTOMY      Allergies  Allergies  Allergen Reactions  . Ace Inhibitors Swelling    Lips swelled.  . Codeine Other (See Comments)    Jittery     History of Present Illness    Jennifer Huff is a 69 y.o. female with a hx of HTN, depression, HLD, GERD, previous tobacco use, family history of coronary disease last seen 04/26/2017 by Dr. Rockey Situ.  Seen by Dr. Rockey Situ in consult April 2019 due to occasional chest tightness, dyspnea on exertion, family history of coronary disease.  She reported occasional discomfort in her chest at rest.  Did note recent stressors.  EKG was without acute ST/T wave changes.  There was discussion regarding screening studies for coronary disease with suggestion of CT coronary calcium scoring.  She was to call if she wanted to have it done.  She was seen in the ED 10/09/2019 due to  epigastric pain.  It was radiating into her back and up into her neck.  She described it as being consistent with reflux or "gas being stuck".  Improved with taking famotidine at home.  EKG with NSR and prolonged QTC, though similar compared to previous.   She presents today for follow-up.  Very pleasant lady who enjoys spending time with her 13 grandchildren.  When asked about the pain that sent her to the emergency department she tells me it had improved with her Pepcid but as it was persistent she chose to be evaluated. Tells me the epigastric pain hurt worst when she was sitting. It was actually relieved by moving around.  Tells me she has been relatively sedentary. Was walking during the summer months, but it was too humid. Tells me she has "terrible eating habits" and attributes most of her epigastric discomfort to her eating habits.  EKGs/Labs/Other Studies Reviewed:   The following studies were reviewed today:  EKG:  EKG is ordered today.  The ekg ordered today demonstrates NSR 73 bpm with no acute ST/T wave changes.   Recent Labs: 10/09/2019: ALT 11; BUN 18; Creatinine, Ser 1.41; Hemoglobin 11.1; Platelets 182; Potassium 3.8; Sodium 141  Recent Lipid Panel    Component Value Date/Time   CHOL 147 09/16/2019 0953   TRIG 91.0 09/16/2019 0953   HDL 57.10 09/16/2019 0953   CHOLHDL 3 09/16/2019 0953   VLDL 18.2 09/16/2019 0953   LDLCALC 71 09/16/2019 0953    Home  Medications   Current Meds  Medication Sig  . acetaminophen (TYLENOL) 500 MG tablet Take 1,000 mg by mouth daily as needed for mild pain.  Marland Kitchen amLODipine (NORVASC) 10 MG tablet Take 1 tablet (10 mg total) by mouth daily.  . Cholecalciferol (VITAMIN D3) 2000 units TABS Take 2,000 Units by mouth at bedtime.   . docusate sodium (COLACE) 100 MG capsule Take 1 capsule (100 mg total) by mouth daily.  . famotidine (PEPCID) 20 MG tablet Take 1 tablet (20 mg total) by mouth 2 (two) times daily as needed for heartburn or indigestion.    . FEROSUL 325 (65 Fe) MG tablet Take 1 tablet (325 mg total) by mouth 2 (two) times daily with a meal.  . ketorolac (ACULAR) 0.4 % SOLN Place 1 drop into the left eye 2 (two) times daily as needed (eye pain).  Marland Kitchen losartan (COZAAR) 50 MG tablet Take 1 tablet (50 mg total) by mouth daily.  . metoprolol tartrate (LOPRESSOR) 25 MG tablet Take 1 tablet (25 mg total) by mouth 2 (two) times daily.  . polyethylene glycol powder (GLYCOLAX/MIRALAX) 17 GM/SCOOP powder Take 1 Container by mouth as needed. Take one capful mix into water as needed for constipation.  . pravastatin (PRAVACHOL) 40 MG tablet Take 1 tablet (40 mg total) by mouth daily.      Review of Systems       Review of Systems  Constitutional: Negative for chills, fever and malaise/fatigue.  Cardiovascular: Negative for chest pain, dyspnea on exertion, irregular heartbeat, leg swelling, near-syncope, orthopnea, palpitations and syncope.  Respiratory: Negative for cough, shortness of breath and wheezing.   Gastrointestinal: Positive for heartburn. Negative for melena, nausea and vomiting.  Genitourinary: Negative for hematuria.  Neurological: Negative for dizziness, light-headedness and weakness.   All other systems reviewed and are otherwise negative except as noted above.  Physical Exam    VS:  BP 128/72 (BP Location: Left Arm, Patient Position: Sitting, Cuff Size: Normal)   Pulse 73   Ht 5' 6.5" (1.689 m)   Wt 269 lb 4 oz (122.1 kg)   SpO2 98%   BMI 42.81 kg/m  , BMI Body mass index is 42.81 kg/m. GEN: Well nourished, overweight, well developed, in no acute distress. HEENT: normal. Neck: Supple, no JVD, carotid bruits, or masses. Cardiac: RRR, no murmurs, rubs, or gallops. No clubbing, cyanosis, edema.  Radials/DP/PT 2+ and equal bilaterally.  Respiratory:  Respirations regular and unlabored, clear to auscultation bilaterally. GI: Soft, nontender, nondistended, BS + x 4. MS: No deformity or atrophy. Skin: Warm and dry, no  rash. Neuro:  Strength and sensation are intact. Psych: Normal affect.  Assessment & Plan    1. Epigastric discomfort/GERD - Symptoms atypical for angina as they occurred at rest and were relieved by movement.  Discomfort was in epigastric region, not chest pain.  Reports no dyspnea on exertion.  EKG today normal sinus rhythm 73 bpm with no acute ST/T wave changes.  Troponin x2 was negative in the ED.  No indication for ischemic evaluation at this time.  She was encouraged to continue her famotidine.  We discussed dietary changes to prevent GERD symptoms.  2. HTN- BP well controlled. Continue current antihypertensive regimen including Amlodipine 10mg , Losartan 50mg , Lopressor 25mg  per primary care provider.   3. HLD- Lipid panel 09/16/19 with LDL 71. Continue Pravastatin 40mg  daily.   Disposition: Follow up in 1 year(s) with Dr. Rockey Situ or APP.   Loel Dubonnet, NP 10/15/2019, 8:46 AM

## 2019-12-05 ENCOUNTER — Other Ambulatory Visit: Payer: Self-pay | Admitting: Family

## 2019-12-05 ENCOUNTER — Telehealth: Payer: Self-pay | Admitting: Urology

## 2019-12-05 ENCOUNTER — Other Ambulatory Visit: Payer: Self-pay

## 2019-12-05 ENCOUNTER — Ambulatory Visit (INDEPENDENT_AMBULATORY_CARE_PROVIDER_SITE_OTHER): Payer: Medicare PPO | Admitting: Endocrinology

## 2019-12-05 ENCOUNTER — Encounter: Payer: Self-pay | Admitting: Endocrinology

## 2019-12-05 VITALS — BP 132/80 | HR 78 | Ht 66.0 in | Wt 272.0 lb

## 2019-12-05 DIAGNOSIS — I10 Essential (primary) hypertension: Secondary | ICD-10-CM

## 2019-12-05 DIAGNOSIS — E785 Hyperlipidemia, unspecified: Secondary | ICD-10-CM

## 2019-12-05 DIAGNOSIS — E278 Other specified disorders of adrenal gland: Secondary | ICD-10-CM | POA: Diagnosis not present

## 2019-12-05 MED ORDER — DEXAMETHASONE 1 MG PO TABS: 1.0000 mg | ORAL_TABLET | ORAL | 0 refills | Status: DC

## 2019-12-05 NOTE — Telephone Encounter (Signed)
Patient called to check on her MRI and I let her know that she will need to get it prior to her app you Dr.Brandon In March 2022. She asked if there was any way she could get a valium or something that would help her relax during the MRI called in. Please advise the patient on this.  Thanks, Sharyn Lull

## 2019-12-05 NOTE — Telephone Encounter (Signed)
Left patient a VM to call before her appointment so that can be addressed in March 2022, asked to call back with any questions

## 2019-12-05 NOTE — Patient Instructions (Addendum)
Let's check a 24-HR urine test, and: You should do a "dexamethasone suppression test."  For this, you would take dexamethasone 1 mg at 10 pm (I have sent a prescription to your pharmacy), then come in for a "cortisol" blood test the next morning before 9 am.  You do not need to be fasting for this test. If these are normal, no further hormonal testing is needed for the adrenal nodule.  You should still do the follow-up MRI, though.

## 2019-12-05 NOTE — Progress Notes (Signed)
Subjective:    Patient ID: Jennifer Huff, female    DOB: 22-Nov-1950, 69 y.o.   MRN: [id number]  HPI Pt is ref by Mable Paris, NP, for adrenal mass.  She has no h/o adrenal dz, but she does have h/o HTN Past Medical History:  Diagnosis Date  . Cataract   . GERD (gastroesophageal reflux disease)   . HTN (hypertension)   . Hypercholesteremia   . Vitamin D deficiency     Past Surgical History:  Procedure Laterality Date  . COLONOSCOPY WITH PROPOFOL N/A 05/06/2019   Procedure: COLONOSCOPY WITH PROPOFOL;  Surgeon: Virgel Manifold, MD;  Location: [facility] ENDOSCOPY;  Service: Endoscopy;  Laterality: N/A;  . KNEE ARTHROSCOPY WITH LATERAL MENISECTOMY Right 11/06/2017   Procedure: KNEE ARTHROSCOPY WITH REPAIR LATERAL MENISCAL ROOT TEAR;  Surgeon: Corky Mull, MD;  Location: [facility] ORS;  Service: Orthopedics;  Laterality: Right;  . LAPAROSCOPIC HYSTERECTOMY      Social History   Socioeconomic History  . Marital status: Married    Spouse name: Not on file  . Number of children: 3  . Years of education: Not on file  . Highest education level: Not on file  Occupational History  . 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 Smoking status: Former Smoker    Types: Cigarettes    Quit date: 12/07/1997    Years since quitting: 22.0  . Smokeless tobacco: Never Used  Vaping Use  . Vaping Use: Never used  Substance and Sexual Activity  . Alcohol use: Not Currently  . Drug use: No  . Sexual activity: Not Currently  Other Topics Concern  . Not on file  Social History Narrative   Regular Exercise -  NO   Daily Caffeine Use:  2-3 soda/sw tea      Retired   Scientist, physiological Strain: Low Risk   . Difficulty of Paying Living Expenses: Not hard at all  Food Insecurity: No Food Insecurity  . Worried About Charity fundraiser in the Last Year: Never true  . Ran Out of Food in the Last Year: Never true  Transportation Needs: No  Transportation Needs  . Lack of Transportation (Medical): No  . Lack of Transportation (Non-Medical): No  Physical Activity:   . Days of Exercise per Week: Not on file  . Minutes of Exercise per Session: Not on file  Stress: No Stress Concern Present  . Feeling of Stress : Not at all  Social Connections: Unknown  . Frequency of Communication with Friends and Family: More than three times a week  . Frequency of Social Gatherings with Friends and Family: More than three times a week  . Attends Religious Services: Not on file  . Active Member of Clubs or Organizations: Not on file  . Attends Archivist Meetings: Not on file  . Marital Status: Not on file  Intimate Partner Violence: Not At Risk  . Fear of Current or Ex-Partner: No  . Emotionally Abused: No  . Physically Abused: No  . Sexually Abused: No    Current Outpatient Medications on File Prior to Visit  Medication Sig Dispense Refill  . acetaminophen (TYLENOL) 500 MG tablet Take 1,000 mg by mouth daily as needed for mild pain.    . Cholecalciferol (VITAMIN D3) 2000 units TABS Take 2,000 Units by mouth at bedtime.     . docusate sodium (COLACE) 100 MG capsule Take 1 capsule (100 mg  total) by mouth daily. 90 capsule 0  . famotidine (PEPCID) 20 MG tablet Take 1 tablet (20 mg total) by mouth 2 (two) times daily as needed for heartburn or indigestion. 120 tablet 1  . FEROSUL 325 (65 Fe) MG tablet Take 1 tablet (325 mg total) by mouth 2 (two) times daily with a meal. 60 tablet 3  . ketorolac (ACULAR) 0.4 % SOLN Place 1 drop into the left eye 2 (two) times daily as needed (eye pain). 5 mL 6  . losartan (COZAAR) 50 MG tablet Take 1 tablet (50 mg total) by mouth daily. 90 tablet 3  . metoprolol tartrate (LOPRESSOR) 25 MG tablet Take 1 tablet (25 mg total) by mouth 2 (two) times daily. 120 tablet 1  . polyethylene glycol powder (GLYCOLAX/MIRALAX) 17 GM/SCOOP powder Take 1 Container by mouth as needed. Take one capful mix into water  as needed for constipation.     No current facility-administered medications on file prior to visit.    Allergies  Allergen Reactions  . Ace Inhibitors Swelling    Lips swelled.  . Codeine Other (See Comments)    Jittery     Family History  Problem Relation Age of Onset  . Kidney disease Father   . Heart disease Father        died from mi  . Cancer Father        prostate  . Hypertension Sister   . Colon polyps Sister   . Hypertension Brother   . Hypertension Sister   . Diabetes Mother   . Hypertension Mother   . Cancer Paternal Grandmother        head and neck  . Hypertension Brother   . Cancer Son   . Colon cancer Neg Hx   . Breast cancer Neg Hx   . Adrenal disorder Neg Hx     BP 132/80   Pulse 78   Ht 5\' 6"  (1.676 m)   Wt 272 lb (123.4 kg)   SpO2 95%   BMI 43.90 kg/m     Review of Systems denies headache, easy bruising, flushing, pallor, n/v, syncope, sob, anxiety, palpitations, fever, and excessive diaphoresis.  She has regained the weight she lost 1 year ago.      Objective:   Physical Exam VS: see vs page GEN: no distress HEAD: head: no deformity eyes: no periorbital swelling, no proptosis external nose and ears are normal NECK: supple, thyroid is not enlarged CHEST WALL: no deformity LUNGS: clear to auscultation CV: reg rate and rhythm, no murmur.  MUSCULOSKELETAL: gait is normal and steady EXTEMITIES: no deformity.  no leg edema NEURO:  cn 2-12 grossly intact.   readily moves all 4's.  sensation is intact to touch on all 4's SKIN:  Normal texture and temperature.  No rash or suspicious lesion is visible.  Not diaphoretic. No hirsutism.  No striae on the abdomen.   NODES:  None palpable at the neck PSYCH: alert, well-oriented.  Does not appear anxious nor depressed.   MRI (2021): 2.9 x 1.9 cm left adrenal nodule which demonstrates minimally increased T1 signal intensity, is isointense on T2 weighted images, clearly enhances and demonstrates  diffusion restriction.    I have reviewed outside records, and summarized: Pt was noted to have adrenal mass, and referred here.  She had CT to eval urolithiasis, and adrenal mass was incidentally noted.        Assessment & Plan:  Adrenal mass, new to me.  uncertain etiology and prognosis  Patient Instructions  Let's check a 24-HR urine test, and: You should do a "dexamethasone suppression test."  For this, you would take dexamethasone 1 mg at 10 pm (I have sent a prescription to your pharmacy), then come in for a "cortisol" blood test the next morning before 9 am.  You do not need to be fasting for this test. If these are normal, no further hormonal testing is needed for the adrenal nodule.  You should still do the follow-up MRI, though.

## 2019-12-08 ENCOUNTER — Other Ambulatory Visit: Payer: Self-pay

## 2019-12-08 DIAGNOSIS — E785 Hyperlipidemia, unspecified: Secondary | ICD-10-CM

## 2019-12-08 MED ORDER — PRAVASTATIN SODIUM 40 MG PO TABS: 40.0000 mg | ORAL_TABLET | Freq: Every day | ORAL | 0 refills | Status: DC

## 2019-12-09 ENCOUNTER — Other Ambulatory Visit: Payer: Self-pay | Admitting: Endocrinology

## 2019-12-09 ENCOUNTER — Other Ambulatory Visit: Payer: Self-pay

## 2019-12-09 ENCOUNTER — Other Ambulatory Visit (INDEPENDENT_AMBULATORY_CARE_PROVIDER_SITE_OTHER): Payer: Medicare PPO

## 2019-12-09 DIAGNOSIS — E278 Other specified disorders of adrenal gland: Secondary | ICD-10-CM

## 2019-12-09 DIAGNOSIS — I10 Essential (primary) hypertension: Secondary | ICD-10-CM

## 2019-12-09 LAB — CORTISOL: Cortisol, Plasma: 2.7 ug/dL

## 2019-12-13 LAB — METANEPHRINES, PLASMA
Metanephrine, Free: 39 pg/mL (ref <=57)
Normetanephrine, Free: 110 pg/mL (ref <=148)
Total Metanephrines-Plasma: 149 pg/mL (ref <=205)

## 2019-12-14 LAB — CATECHOLAMINES, FRACTIONATED, URINE, 24 HOUR
Calc Total (E+NE): 53 mcg/24 h (ref 26–121)
Creatinine, Urine mg/day-CATEUR: 1.64 g/(24.h) (ref 0.50–2.15)
Dopamine 24 Hr Urine: 258 mcg/24 h (ref 52–480)
Norepinephrine, 24H, Ur: 53 mcg/24 h (ref 15–100)
Total Volume: 2750 mL

## 2019-12-14 LAB — METANEPHRINES, URINE, 24 HOUR
Metaneph Total, Ur: 591 mcg/24 h (ref 224–832)
Metanephrines, Ur: 126 mcg/24 h (ref 90–315)
Normetanephrine, 24H Ur: 465 mcg/24 h (ref 122–676)
Volume, Urine-VMAUR: 2750 mL

## 2019-12-16 ENCOUNTER — Ambulatory Visit (INDEPENDENT_AMBULATORY_CARE_PROVIDER_SITE_OTHER): Payer: Medicare PPO | Admitting: Family

## 2019-12-16 ENCOUNTER — Other Ambulatory Visit: Payer: Self-pay

## 2019-12-16 ENCOUNTER — Encounter: Payer: Self-pay | Admitting: Family

## 2019-12-16 DIAGNOSIS — N189 Chronic kidney disease, unspecified: Secondary | ICD-10-CM

## 2019-12-16 DIAGNOSIS — E782 Mixed hyperlipidemia: Secondary | ICD-10-CM | POA: Diagnosis not present

## 2019-12-16 DIAGNOSIS — E278 Other specified disorders of adrenal gland: Secondary | ICD-10-CM

## 2019-12-16 DIAGNOSIS — I10 Essential (primary) hypertension: Secondary | ICD-10-CM | POA: Diagnosis not present

## 2019-12-16 NOTE — Patient Instructions (Signed)
Nice to see you!   

## 2019-12-16 NOTE — Assessment & Plan Note (Signed)
Controlled. Continue pravastatin 40mg 

## 2019-12-16 NOTE — Assessment & Plan Note (Signed)
Stable. Continue amlodipine  10mg , losartan 50mg , lopressor 25mg .

## 2019-12-16 NOTE — Assessment & Plan Note (Addendum)
Completed hormone evaluation of adrenal mass with Dr Loanne Drilling- normal cortisol, epinephrine. She upcoming appointment with Dr Erlene Quan in march 2022 for repeat MRI. Will follow.

## 2019-12-16 NOTE — Assessment & Plan Note (Signed)
Stable. No PPI, NSAIDs. She follows annually with Dr Royce Macadamia, nephrology. Will follow.

## 2019-12-16 NOTE — Progress Notes (Signed)
Subjective:    Patient ID: Jennifer Huff, female    DOB: [birth year], 69 y.o.   MRN: [id number]  CC: Jennifer Huff is a 69 y.o. female who presents today for follow up.   HPI:  Feels well today.  No complaints.   Left adrenal mass- Consult with Dr Jennifer Huff 2 weeks ago for adrenal mass.  HTN- compliant with amlodipine  10mg , losartan 50mg , lopressor 25mg . No cp, sob. CKD- She is not a PPI, and she doesn't take NSAIDs.   HLD- compliant with pravastatin 40mg   Follow up with Dr Jennifer Huff- she has follow  Up regarding adrenal mass surveillance scheduled 03/17/2019     HISTORY:  Past Medical History:  Diagnosis Date  . Cataract   . GERD (gastroesophageal reflux disease)   . HTN (hypertension)   . Hypercholesteremia   . Vitamin D deficiency    Past Surgical History:  Procedure Laterality Date  . COLONOSCOPY WITH PROPOFOL N/A 05/06/2019   Procedure: COLONOSCOPY WITH PROPOFOL;  Surgeon: Jennifer Huff;  Location: [facility];  Service: Endoscopy;  Laterality: N/A;  . KNEE ARTHROSCOPY WITH LATERAL MENISECTOMY Right 11/06/2017   Procedure: KNEE ARTHROSCOPY WITH REPAIR LATERAL MENISCAL ROOT TEAR;  Surgeon: Jennifer Huff;  Location: [facility];  Service: Orthopedics;  Laterality: Right;  . LAPAROSCOPIC HYSTERECTOMY     Family History  Problem Relation Age of Onset  . Kidney disease Father   . Heart disease Father        died from mi  . Cancer Father        prostate  . Hypertension Sister   . Colon polyps Sister   . Hypertension Brother   . Hypertension Sister   . Diabetes Mother   . Hypertension Mother   . Cancer Paternal Grandmother        head and neck  . Hypertension Brother   . Cancer Son   . Colon cancer Neg Hx   . Breast cancer Neg Hx   . Adrenal disorder Neg Hx     Allergies: Ace inhibitors and Codeine Current Outpatient Medications on File Prior to Visit  Medication Sig Dispense Refill  . acetaminophen (TYLENOL) 500 MG tablet Take  1,000 mg by mouth daily as needed for mild pain.    Marland Kitchen amLODipine (NORVASC) 10 MG tablet Take 1 tablet (10 mg total) by mouth daily. 90 tablet 0  . Cholecalciferol (VITAMIN D3) 2000 units TABS Take 2,000 Units by mouth at bedtime.     . docusate sodium (COLACE) 100 MG capsule Take 1 capsule (100 mg total) by mouth daily. 90 capsule 0  . famotidine (PEPCID) 20 MG tablet Take 1 tablet (20 mg total) by mouth 2 (two) times daily as needed for heartburn or indigestion. 120 tablet 1  . FEROSUL 325 (65 Fe) MG tablet Take 1 tablet (325 mg total) by mouth 2 (two) times daily with a meal. 60 tablet 3  . ketorolac (ACULAR) 0.4 % SOLN Place 1 drop into the left eye 2 (two) times daily as needed (eye pain). 5 mL 6  . losartan (COZAAR) 50 MG tablet Take 1 tablet (50 mg total) by mouth daily. 90 tablet 3  . metoprolol tartrate (LOPRESSOR) 25 MG tablet Take 1 tablet (25 mg total) by mouth 2 (two) times daily. 120 tablet 1  . polyethylene glycol powder (GLYCOLAX/MIRALAX) 17 GM/SCOOP powder Take 1 Container by mouth as needed. Take one capful mix into water as needed for constipation.    Marland Kitchen  pravastatin (PRAVACHOL) 40 MG tablet Take 1 tablet (40 mg total) by mouth daily. 90 tablet 0   No current facility-administered medications on file prior to visit.    Social History   Tobacco Use  . Smoking status: Former Smoker    Types: Cigarettes    Quit date: 12/07/1997    Years since quitting: 22.0  . Smokeless tobacco: Never Used  Vaping Use  . Vaping Use: Never used  Substance Use Topics  . Alcohol use: Not Currently  . Drug use: No    Review of Systems  Constitutional: Negative for chills and fever.  Respiratory: Negative for cough.   Cardiovascular: Negative for chest pain and palpitations.  Gastrointestinal: Negative for nausea and vomiting.      Objective:    BP 120/76   Pulse 74   Temp 98.2 F (36.8 C)   Ht 5\' 6"  (1.676 m)   Wt 272 lb 6.4 oz (123.6 kg)   SpO2 97%   BMI 43.97 kg/m  BP  Readings from Last 3 Encounters:  12/16/19 120/76  12/05/19 132/80  10/15/19 128/72   Wt Readings from Last 3 Encounters:  12/16/19 272 lb 6.4 oz (123.6 kg)  12/05/19 272 lb (123.4 kg)  10/15/19 269 lb 4 oz (122.1 kg)    Physical Exam Vitals reviewed.  Constitutional:      Appearance: She is well-developed and well-nourished.  Eyes:     Conjunctiva/sclera: Conjunctivae normal.  Cardiovascular:     Rate and Rhythm: Normal rate and regular rhythm.     Pulses: Normal pulses.     Heart sounds: Normal heart sounds.  Pulmonary:     Effort: Pulmonary effort is normal.     Breath sounds: Normal breath sounds. No wheezing, rhonchi or rales.  Skin:    General: Skin is warm and dry.  Neurological:     Mental Status: She is alert.  Psychiatric:        Mood and Affect: Mood and affect normal.        Speech: Speech normal.        Behavior: Behavior normal.        Thought Content: Thought content normal.        Assessment & Plan:   Problem List Items Addressed This Visit      Cardiovascular and Mediastinum   Hypertension    Stable. Continue amlodipine  10mg , losartan 50mg , lopressor 25mg .         Genitourinary   CKD (chronic kidney disease)    Stable. No PPI, NSAIDs. She follows annually with Dr Jennifer Huff, nephrology. Will follow.         Other   Adrenal mass ([place])    Completed hormone evaluation of adrenal mass with Dr Jennifer Huff- normal cortisol, epinephrine. She upcoming appointment with Dr Jennifer Huff in march 2022 for repeat MRI. Will follow.       Mixed hyperlipidemia    Controlled. Continue pravastatin 40mg           I have discontinued [name]. Jennifer Huff's dexamethasone. [name] her Vitamin D3, acetaminophen, ketorolac, famotidine, metoprolol tartrate, losartan, docusate sodium, polyethylene glycol powder, FeroSul, amLODipine, and pravastatin.   No orders of the defined types were placed in this encounter.   Return precautions given.    Risks, benefits, and alternatives of the medications and treatment plan prescribed today were discussed, and patient expressed understanding.   Education regarding symptom management and diagnosis given to patient on AVS.  Continue to follow with  Burnard Hawthorne, FNP for routine health maintenance.   Loetta Rough and I agreed with plan.   Mable Paris, FNP

## 2019-12-25 LAB — ALDOSTERONE + RENIN ACTIVITY W/ RATIO
ALDO / PRA Ratio: 1.9 Ratio (ref 0.9–28.9)
Aldosterone: 3 ng/dL
Renin Activity: 1.57 ng/mL/h (ref 0.25–5.82)

## 2019-12-25 LAB — CATECHOLAMINES, FRACTIONATED, PLASMA
Dopamine: <20 pg/mL
Epinephrine: <40 pg/mL
Norepinephrine: 668 pg/mL
Total Catecholamines: 668 pg/mL

## 2020-01-08 ENCOUNTER — Other Ambulatory Visit: Payer: Self-pay | Admitting: Family

## 2020-01-08 DIAGNOSIS — K219 Gastro-esophageal reflux disease without esophagitis: Secondary | ICD-10-CM

## 2020-02-10 ENCOUNTER — Other Ambulatory Visit: Payer: Self-pay | Admitting: Oncology

## 2020-02-13 ENCOUNTER — Ambulatory Visit: Payer: Medicare PPO | Admitting: Family

## 2020-03-05 ENCOUNTER — Other Ambulatory Visit: Payer: Self-pay | Admitting: Family

## 2020-03-05 DIAGNOSIS — K219 Gastro-esophageal reflux disease without esophagitis: Secondary | ICD-10-CM

## 2020-03-08 ENCOUNTER — Telehealth: Payer: Self-pay

## 2020-03-08 ENCOUNTER — Other Ambulatory Visit: Payer: Self-pay | Admitting: Oncology

## 2020-03-08 NOTE — Telephone Encounter (Signed)
Patient is scheduled on 03/11/2020 for MRI. Pt is asking for Valium to take prior to MRI

## 2020-03-09 ENCOUNTER — Other Ambulatory Visit: Payer: Self-pay | Admitting: Urology

## 2020-03-09 MED ORDER — DIAZEPAM 10 MG PO TABS: 10.0000 mg | ORAL_TABLET | Freq: Once | ORAL | 0 refills | Status: AC

## 2020-03-09 NOTE — Telephone Encounter (Signed)
Done, must have driver  Quanika Solem, MD  

## 2020-03-11 ENCOUNTER — Ambulatory Visit
Admission: RE | Admit: 2020-03-11 | Discharge: 2020-03-11 | Disposition: A | Discharge status: Home / Self Care | Payer: Medicare PPO | Source: Ambulatory Visit | Attending: Urology | Admitting: Urology

## 2020-03-11 ENCOUNTER — Other Ambulatory Visit: Payer: Self-pay | Admitting: Family

## 2020-03-11 ENCOUNTER — Other Ambulatory Visit: Payer: Self-pay

## 2020-03-11 ENCOUNTER — Ambulatory Visit: Payer: Medicare PPO

## 2020-03-11 DIAGNOSIS — E278 Other specified disorders of adrenal gland: Secondary | ICD-10-CM | POA: Insufficient documentation

## 2020-03-11 DIAGNOSIS — I10 Essential (primary) hypertension: Secondary | ICD-10-CM

## 2020-03-11 MED ORDER — GADOBUTROL 1 MMOL/ML IV SOLN
10.0000 mL | Freq: Once | INTRAVENOUS | Status: AC | PRN
  Administered 2020-03-11: 10 mL via INTRAVENOUS

## 2020-03-16 ENCOUNTER — Encounter: Payer: Self-pay | Admitting: Urology

## 2020-03-16 ENCOUNTER — Other Ambulatory Visit: Payer: Self-pay

## 2020-03-16 ENCOUNTER — Ambulatory Visit (INDEPENDENT_AMBULATORY_CARE_PROVIDER_SITE_OTHER): Payer: Medicare PPO | Admitting: Urology

## 2020-03-16 VITALS — BP 143/80 | HR 82

## 2020-03-16 DIAGNOSIS — E278 Other specified disorders of adrenal gland: Secondary | ICD-10-CM | POA: Diagnosis not present

## 2020-03-16 NOTE — Progress Notes (Signed)
03/16/2020 8:14 PM   Jennifer Huff 12/06/1950 [id number]  Referring provider: Burnard Hawthorne, FNP [address] [place],  [place] [address]  Chief Complaint  Patient presents with  . ADRENAL MASS    HPI: 70 yo F with a personal history of incidental left adrenal nodule who returns today for annual follow-up with MRI.  Please see previous notes for details.  She was found to have an incidental adrenal nodule on CT scan in 01/02/2019.  This measured 2.9 x 1.9.  She underwent abbreviated metabolic evaluation which was negative.  She remains asymptomatic from this.  She follows up today with interval MRI which shows essentially stable adrenal mass, unchanged consistent with adenoma.  She denies any flank pain, palpitations, poorly controlled blood pressure, etc.   She recently returned to her primary care physician at which time it was noted that she is on 3 blood pressure medications for control including losartan 50 mg, metoprolol 25 mg twice daily and amlodipine 10 mg daily.    She was seen and evaluated by Dr. Loanne Drilling (endocrinology) for evaluation.   This was in December 2021.  She had repeat extensive metabolic evaluation all of which was negative.   PMH: Past Medical History:  Diagnosis Date  . Cataract   . GERD (gastroesophageal reflux disease)   . HTN (hypertension)   . Hypercholesteremia   . Vitamin D deficiency     Surgical History: Past Surgical History:  Procedure Laterality Date  . COLONOSCOPY WITH PROPOFOL N/A 05/06/2019   Procedure: COLONOSCOPY WITH PROPOFOL;  Surgeon: Virgel Manifold, MD;  Location: [facility];  Service: Endoscopy;  Laterality: N/A;  . KNEE ARTHROSCOPY WITH LATERAL MENISECTOMY Right 11/06/2017   Procedure: KNEE ARTHROSCOPY WITH REPAIR LATERAL MENISCAL ROOT TEAR;  Surgeon: Corky Mull, MD;  Location: [facility];  Service: Orthopedics;  Laterality: Right;  . LAPAROSCOPIC HYSTERECTOMY      Home Medications:   Allergies as of 03/16/2020      Reactions   Ace Inhibitors Swelling   Lips swelled.   Codeine Other (See Comments)   Jittery       Medication List       Accurate as of March 16, 2020  8:14 PM. If you have any questions, ask your nurse or doctor.        acetaminophen 500 MG tablet Commonly known as: TYLENOL Take 1,000 mg by mouth daily as needed for mild pain.   amLODipine 10 MG tablet Commonly known as: NORVASC Take 1 tablet (10 mg total) by mouth daily.   docusate sodium 100 MG capsule Commonly known as: COLACE Take 1 capsule (100 mg total) by mouth daily.   famotidine 20 MG tablet Commonly known as: PEPCID Take 1 tablet (20 mg total) by mouth 2 (two) times daily as needed for heartburn or indigestion.   FeroSul 325 (65 FE) MG tablet Generic drug: ferrous sulfate Take 1 tablet (325 mg total) by mouth 2 (two) times daily with a meal.   ketorolac 0.4 % Soln Commonly known as: ACULAR Place 1 drop into the left eye 2 (two) times daily as needed (eye pain).   losartan 50 MG tablet Commonly known as: COZAAR Take 1 tablet (50 mg total) by mouth daily.   metoprolol tartrate 25 MG tablet Commonly known as: LOPRESSOR Take 1 tablet (25 mg total) by mouth 2 (two) times daily.   polyethylene glycol powder 17 GM/SCOOP powder Commonly known as: GLYCOLAX/MIRALAX Take 1 Container by mouth as needed.  Take one capful mix into water as needed for constipation.   pravastatin 40 MG tablet Commonly known as: PRAVACHOL Take 1 tablet (40 mg total) by mouth daily.   Vitamin D3 50 MCG (2000 UT) Tabs Take 2,000 Units by mouth at bedtime.       Allergies:  Allergies  Allergen Reactions  . Ace Inhibitors Swelling    Lips swelled.  . Codeine Other (See Comments)    Jittery     Family History: Family History  Problem Relation Age of Onset  . Kidney disease Father   . Heart disease Father        died from mi  . Cancer Father        prostate  . Hypertension Sister   .  Colon polyps Sister   . Hypertension Brother   . Hypertension Sister   . Diabetes Mother   . Hypertension Mother   . Cancer Paternal Grandmother        head and neck  . Hypertension Brother   . Cancer Son   . Colon cancer Neg Hx   . Breast cancer Neg Hx   . Adrenal disorder Neg Hx     Social History:  reports that she quit smoking about 22 years ago. Her smoking use included cigarettes. She has never used smokeless tobacco. She reports previous alcohol use. She reports that she does not use drugs.   Physical Exam: BP (!) 143/80   Pulse 82   Constitutional:  Alert and oriented, No acute distress. HEENT: [place] AT, moist mucus membranes.  Trachea midline, no masses. Cardiovascular: No clubbing, cyanosis, or edema. Respiratory: Normal respiratory effort, no increased work of breathing. Skin: No rashes, bruises or suspicious lesions. Neurologic: Grossly intact, no focal deficits, moving all 4 extremities. Psychiatric: Normal mood and affect.  Laboratory Data: Lab Results  Component Value Date   CREATININE 1.41 (H) 10/09/2019   MR  IMPRESSION: 1. Unchanged size of the solid arterially enhancing left adrenal nodule measuring 2.9 cm, which most likely represents a lipid poor adenoma given the 2 years of stability, imaging characteristics and no history of primary neoplasm. However, recommend biochemical workup for functioning adenoma and pheochromocytoma if not previously performed. Additionally, further characterization of this lesion could be obtained through adrenal protocol CT with and without contrast if clinically indicated. 2. Mild diffuse hepatic steatosis. 3. Benign splenic lymphangiomas. 4. Bilateral cystic renal lesions, no suspicious enhancing renal lesions on this study which is slightly limited by respiratory motion artifact.   Electronically Signed   By: Dahlia Bailiff MD   On: 03/11/2020 16:30      Assessment & Plan:    1. Adrenal nodule  (H[facility]) Incidental stable adrenal adenoma  Extensive metabolic evaluation now x2 without evidence of a functioning adenoma  No change in size and as such, recommend no further evaluation, excision, or work-up.  She is agreeable this plan.   No follow-ups on file.  Hollice Espy, MD  [facility] [address] [place], [place] [address] [phone]

## 2020-03-23 ENCOUNTER — Other Ambulatory Visit: Payer: Self-pay

## 2020-03-23 ENCOUNTER — Inpatient Hospital Stay: Payer: Medicare PPO | Attending: Oncology

## 2020-03-23 DIAGNOSIS — E7800 Pure hypercholesterolemia, unspecified: Secondary | ICD-10-CM | POA: Insufficient documentation

## 2020-03-23 DIAGNOSIS — Z87891 Personal history of nicotine dependence: Secondary | ICD-10-CM | POA: Insufficient documentation

## 2020-03-23 DIAGNOSIS — E559 Vitamin D deficiency, unspecified: Secondary | ICD-10-CM | POA: Insufficient documentation

## 2020-03-23 DIAGNOSIS — E279 Disorder of adrenal gland, unspecified: Secondary | ICD-10-CM | POA: Insufficient documentation

## 2020-03-23 DIAGNOSIS — D631 Anemia in chronic kidney disease: Secondary | ICD-10-CM | POA: Insufficient documentation

## 2020-03-23 DIAGNOSIS — K219 Gastro-esophageal reflux disease without esophagitis: Secondary | ICD-10-CM | POA: Insufficient documentation

## 2020-03-23 DIAGNOSIS — N189 Chronic kidney disease, unspecified: Secondary | ICD-10-CM | POA: Insufficient documentation

## 2020-03-23 DIAGNOSIS — E278 Other specified disorders of adrenal gland: Secondary | ICD-10-CM

## 2020-03-23 DIAGNOSIS — I10 Essential (primary) hypertension: Secondary | ICD-10-CM | POA: Insufficient documentation

## 2020-03-23 DIAGNOSIS — I129 Hypertensive chronic kidney disease with stage 1 through stage 4 chronic kidney disease, or unspecified chronic kidney disease: Secondary | ICD-10-CM | POA: Diagnosis not present

## 2020-03-23 DIAGNOSIS — Z8616 Personal history of COVID-19: Secondary | ICD-10-CM | POA: Insufficient documentation

## 2020-03-23 DIAGNOSIS — Z803 Family history of malignant neoplasm of breast: Secondary | ICD-10-CM | POA: Insufficient documentation

## 2020-03-23 DIAGNOSIS — K760 Fatty (change of) liver, not elsewhere classified: Secondary | ICD-10-CM | POA: Diagnosis not present

## 2020-03-23 DIAGNOSIS — Z79899 Other long term (current) drug therapy: Secondary | ICD-10-CM | POA: Insufficient documentation

## 2020-03-23 LAB — COMPREHENSIVE METABOLIC PANEL
ALT: 11 U/L (ref 0–44)
AST: 15 U/L (ref 15–41)
Albumin: 3.8 g/dL (ref 3.5–5.0)
Alkaline Phosphatase: 86 U/L (ref 38–126)
Anion gap: 8 (ref 5–15)
BUN: 13 mg/dL (ref 8–23)
CO2: 25 mmol/L (ref 22–32)
Calcium: 9.6 mg/dL (ref 8.9–10.3)
Chloride: 107 mmol/L (ref 98–111)
Creatinine, Ser: 1.25 mg/dL — ABNORMAL HIGH (ref 0.44–1.00)
GFR, Estimated: 47 mL/min — ABNORMAL LOW (ref >60)
Glucose, Bld: 129 mg/dL — ABNORMAL HIGH (ref 70–99)
Potassium: 4 mmol/L (ref 3.5–5.1)
Sodium: 140 mmol/L (ref 135–145)
Total Bilirubin: 0.7 mg/dL (ref 0.3–1.2)
Total Protein: 7.1 g/dL (ref 6.5–8.1)

## 2020-03-23 LAB — CBC WITH DIFFERENTIAL/PLATELET
Abs Immature Granulocytes: 0.02 10*3/uL (ref 0.00–0.07)
Basophils Absolute: 0 10*3/uL (ref 0.0–0.1)
Basophils Relative: 0 %
Eosinophils Absolute: 0.1 10*3/uL (ref 0.0–0.5)
Eosinophils Relative: 1 %
HCT: 36.8 % (ref 36.0–46.0)
Hemoglobin: 11.8 g/dL — ABNORMAL LOW (ref 12.0–15.0)
Immature Granulocytes: 0 %
Lymphocytes Relative: 28 %
Lymphs Abs: 1.6 10*3/uL (ref 0.7–4.0)
MCH: 26.5 pg (ref 26.0–34.0)
MCHC: 32.1 g/dL (ref 30.0–36.0)
MCV: 82.7 fL (ref 80.0–100.0)
Monocytes Absolute: 0.7 10*3/uL (ref 0.1–1.0)
Monocytes Relative: 12 %
Neutro Abs: 3.4 10*3/uL (ref 1.7–7.7)
Neutrophils Relative %: 59 %
Platelets: 157 10*3/uL (ref 150–400)
RBC: 4.45 MIL/uL (ref 3.87–5.11)
RDW: 14.9 % (ref 11.5–15.5)
WBC: 5.7 10*3/uL (ref 4.0–10.5)
nRBC: 0 % (ref 0.0–0.2)

## 2020-03-23 LAB — IRON AND TIBC
Iron: 60 ug/dL (ref 28–170)
Saturation Ratios: 19 % (ref 10.4–31.8)
TIBC: 325 ug/dL (ref 250–450)
UIBC: 265 ug/dL

## 2020-03-23 LAB — FERRITIN: Ferritin: 148 ng/mL (ref 11–307)

## 2020-03-25 ENCOUNTER — Encounter: Payer: Self-pay | Admitting: Oncology

## 2020-03-25 ENCOUNTER — Inpatient Hospital Stay (HOSPITAL_BASED_OUTPATIENT_CLINIC_OR_DEPARTMENT_OTHER): Payer: Medicare PPO | Admitting: Oncology

## 2020-03-25 VITALS — BP 127/82 | HR 73 | Temp 97.9°F | Wt 265.0 lb

## 2020-03-25 DIAGNOSIS — E278 Other specified disorders of adrenal gland: Secondary | ICD-10-CM | POA: Diagnosis not present

## 2020-03-25 DIAGNOSIS — E279 Disorder of adrenal gland, unspecified: Secondary | ICD-10-CM | POA: Diagnosis not present

## 2020-03-25 DIAGNOSIS — D649 Anemia, unspecified: Secondary | ICD-10-CM | POA: Diagnosis not present

## 2020-03-25 NOTE — Progress Notes (Signed)
Hematology/Oncology follow up note [facility] [phone] [phone]   Patient Care Team: Burnard Hawthorne, FNP as PCP - General (Family Medicine) Minna Merritts, MD as PCP - Cardiology (Cardiology) Clent Jacks, RN as Oncology Nurse Navigator Earlie Server, MD as Consulting Physician (Hematology and Oncology)  REFERRING PROVIDER: Burnard Hawthorne, FNP  CHIEF COMPLAINTS/REASON FOR VISIT:  Follow-up visit for adrenal mass, microcytosis.  HISTORY OF PRESENTING ILLNESS:  Jennifer Huff is a  70 y.o.  female with PMH listed below was seen in consultation at the request of  Burnard Hawthorne, FNP  for evaluation of adrenal mass  Patient recently presented to emergency room on 01/01/2019 complaining left sided abdominal/back pain.  Lasted for about 24 hours, pain started in the left back radiating around to the left lower quadrant. Prior to that she was diagnosed COVID-19 infection on 12/21/2018. Patient had CT renal stone study done on 01/01/2019 With normal expansion, heterogeneously density and stranding with adjacent fluid along the left ovary. In addition, there is no specific mass of the left measuring up to 2.8 cm in long axis with marginal stranding. There is at least 4 abnormal hypodense lesion in the spleen.  Bilateral renal lesions.  Faint subpleural groundglass opacities in both lungs.  01/01/2019 US Pelvis showed a heterogeneously enlarged left ovary for postmenopausal patient.  Ovarian volume 51.7.  Ovarian parenchyma is heterogeneous.  Blood flow without evidence of torsion.  Ovarian neoplasm is considered, though a discrete cystic or solid mass is difficult to delineate from diffuse heterogeneity.  Small volume of pelvic free fluid.  #01/01/2019 MRI abdomen with and without contrast showed no acute findings noted in abdomen to account for patient's symptoms.  2.9 x 1.9 cm left adrenal nodule has suspicious  characteristics.  Patchy ill-defined areas of increased signal intensity in the visualized lung bases which likely reflux.  19 infection.  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    Patient was released from the emergency room to follow-up with primary care provider and GYN physician. Patient has an appointment with GYN on 01/16/2019. Patient was referred by primary care provider to cancer center for evaluation of adrenal mass.  Patient was accompanied by her husband.  She denies any additional episodes of left lower quadrant discomfort.  No pain  #History of COVID-19 infection  #seen by urology Dr. 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  No intervention was recommended for adrenal adenoma at this point.  She was recommended to repeat MRI in 1 year to ensure stability or return sooner to urology if she develops symptoms.  05/06/2019.  Colonoscopy was done which showed multiple polyps.  No malignancy.  Patient was recommended to repeat colonoscopy in 5 years.  INTERVAL HISTORY Jennifer Huff is a 70 y.o. female who has above history reviewed by me today presents for follow up visit for management of renal mass and patient was also referred back for microcytic anemia. Problems and complaints are listed below: Patient reports feeling well.  No new complaints today. She has adrenal mass which was followed up by urology.  She has had MRI surveillance.   Review of Systems  Constitutional: Negative for appetite change, chills, fatigue and fever.  HENT:   Negative for hearing loss and voice change.   Eyes: Negative for eye problems.  Respiratory: Negative for  chest tightness and cough.   Cardiovascular: Negative for chest pain.  Gastrointestinal: Negative for abdominal distention, abdominal pain and blood in stool.  Endocrine:  Negative for hot flashes.  Genitourinary: Negative for difficulty urinating and frequency.   Musculoskeletal: Negative for arthralgias.  Skin: Negative for itching and rash.  Neurological: Negative for extremity weakness.  Hematological: Negative for adenopathy.  Psychiatric/Behavioral: Negative for confusion.    MEDICAL HISTORY:  Past Medical History:  Diagnosis Date  . Cataract   . GERD (gastroesophageal reflux disease)   . HTN (hypertension)   . Hypercholesteremia   . Vitamin D deficiency     SURGICAL HISTORY: Past Surgical History:  Procedure Laterality Date  . COLONOSCOPY WITH PROPOFOL N/A 05/06/2019   Procedure: COLONOSCOPY WITH PROPOFOL;  Surgeon: Virgel Manifold, MD;  Location: [facility];  Service: Endoscopy;  Laterality: N/A;  . KNEE ARTHROSCOPY WITH LATERAL MENISECTOMY Right 11/06/2017   Procedure: KNEE ARTHROSCOPY WITH REPAIR LATERAL MENISCAL ROOT TEAR;  Surgeon: Corky Mull, MD;  Location: [facility] ORS;  Service: Orthopedics;  Laterality: Right;  . LAPAROSCOPIC HYSTERECTOMY      SOCIAL HISTORY: Social History   Socioeconomic History  . Marital status: Married    Spouse name: Not on file  . Number of children: 3  . Years of education: Not on file  . Highest education level: Not on file  Occupational History  . 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 Smoking status: Former Smoker    Types: Cigarettes    Quit date: 12/07/1997    Years since quitting: 22.3  . Smokeless tobacco: Never Used  Vaping Use  . Vaping Use: Never used  Substance and Sexual Activity  . Alcohol use: Not Currently  . Drug use: No  . Sexual activity: Not Currently  Other Topics Concern  . Not on file  Social History Narrative   Regular Exercise -  NO   Daily Caffeine Use:  2-3 soda/sw tea      Retired   Scientist, physiological Strain: Low Risk   . Difficulty of Paying Living Expenses: Not hard at all  Food Insecurity: No Food  Insecurity  . Worried About Charity fundraiser in the Last Year: Never true  . Ran Out of Food in the Last Year: Never true  Transportation Needs: No Transportation Needs  . Lack of Transportation (Medical): No  . Lack of Transportation (Non-Medical): No  Physical Activity: Not on file  Stress: No Stress Concern Present  . Feeling of Stress : Not at all  Social Connections: Unknown  . Frequency of Communication with Friends and Family: More than three times a week  . Frequency of Social Gatherings with Friends and Family: More than three times a week  . Attends Religious Services: Not on file  . Active Member of Clubs or Organizations: Not on file  . Attends Archivist Meetings: Not on file  . Marital Status: Not on file  Intimate Partner Violence: Not At Risk  . Fear of Current or Ex-Partner: No  . Emotionally Abused: No  . Physically Abused: No  . Sexually Abused: No    FAMILY HISTORY: Family History  Problem Relation Age of Onset  . Kidney disease Father   . Heart disease Father        died from mi  . Cancer Father        prostate  . Hypertension Sister   . Colon polyps  Sister   . Hypertension Brother   . Hypertension Sister   . Diabetes Mother   . Hypertension Mother   . Cancer Paternal Grandmother        head and neck  . Hypertension Brother   . Cancer Son   . Colon cancer Neg Hx   . Breast cancer Neg Hx   . Adrenal disorder Neg Hx     ALLERGIES:  is allergic to ace inhibitors and codeine.  MEDICATIONS:  Current Outpatient Medications  Medication Sig Dispense Refill  . acetaminophen (TYLENOL) 500 MG tablet Take 1,000 mg by mouth daily as needed for mild pain.    Marland Kitchen amLODipine (NORVASC) 10 MG tablet Take 1 tablet (10 mg total) by mouth daily. 90 tablet 0  . Cholecalciferol (VITAMIN D3) 2000 units TABS Take 2,000 Units by mouth at bedtime.     . docusate sodium (COLACE) 100 MG capsule Take 1 capsule (100 mg total) by mouth daily. 90 capsule 0  .  famotidine (PEPCID) 20 MG tablet Take 1 tablet (20 mg total) by mouth 2 (two) times daily as needed for heartburn or indigestion. 120 tablet 0  . FEROSUL 325 (65 Fe) MG tablet Take 1 tablet (325 mg total) by mouth 2 (two) times daily with a meal. 60 tablet 0  . ketorolac (ACULAR) 0.4 % SOLN Place 1 drop into the left eye 2 (two) times daily as needed (eye pain). 5 mL 6  . losartan (COZAAR) 50 MG tablet Take 1 tablet (50 mg total) by mouth daily. 90 tablet 3  . metoprolol tartrate (LOPRESSOR) 25 MG tablet Take 1 tablet (25 mg total) by mouth 2 (two) times daily. 120 tablet 1  . polyethylene glycol powder (GLYCOLAX/MIRALAX) 17 GM/SCOOP powder Take 1 Container by mouth as needed. Take one capful mix into water as needed for constipation.    . pravastatin (PRAVACHOL) 40 MG tablet Take 1 tablet (40 mg total) by mouth daily. 90 tablet 0   No current facility-administered medications for this visit.     PHYSICAL EXAMINATION: ECOG PERFORMANCE STATUS: 1 - Symptomatic but completely ambulatory Vitals:   03/25/20 1025  BP: 127/82  Pulse: 73  Temp: 97.9 F (36.6 C)   Filed Weights   03/25/20 1025  Weight: 265 lb (120.2 kg)    Physical Exam Constitutional:      Appearance: Normal appearance.  HENT:     Head: Normocephalic.  Eyes:     General: No scleral icterus. Cardiovascular:     Rate and Rhythm: Normal rate.  Pulmonary:     Effort: Pulmonary effort is normal.  Abdominal:     General: There is no distension.     Palpations: Abdomen is soft.  Musculoskeletal:        General: Normal range of motion.     Cervical back: Normal range of motion.  Skin:    Coloration: Skin is not jaundiced.  Neurological:     General: No focal deficit present.     Mental Status: She is alert.  Psychiatric:        Mood and Affect: Mood normal.     LABORATORY DATA:  I have reviewed the data as listed Lab Results  Component Value Date   WBC 5.7 03/23/2020   HGB 11.8 (L) 03/23/2020   HCT 36.8  03/23/2020   MCV 82.7 03/23/2020   PLT 157 03/23/2020   Recent Labs    06/23/19 0833 09/18/19 1102 10/09/19 0258 03/23/20 1108  NA 142 140 141 140  K 4.2 4.1 3.8 4.0  CL 110 107 107 107  CO2 24 25 28 25   GLUCOSE 130* 139* 169* 129*  BUN 19 19 18 13   CREATININE 1.42* 1.45* 1.41* 1.25*  CALCIUM 9.4 9.3 10.0 9.6  GFRNONAA 38* 37* 38* 47*  GFRAA 44* 43*  --   --   PROT 7.5 7.2 7.3 7.1  ALBUMIN 3.8 3.6 3.9 3.8  AST 15 14* 16 15  ALT 11 11 11 11   ALKPHOS 84 75 88 86  BILITOT 0.8 0.6 0.8 0.7   Iron/TIBC/Ferritin/ %Sat    Component Value Date/Time   IRON 60 03/23/2020 1108   IRON 60 06/04/2019 1129   TIBC 325 03/23/2020 1108   TIBC 294 06/04/2019 1129   FERRITIN 148 03/23/2020 1108   FERRITIN 284 (H) 06/04/2019 1129   IRONPCTSAT 19 03/23/2020 1108   IRONPCTSAT 20 06/04/2019 1129      RADIOGRAPHIC STUDIES: I have personally reviewed the radiological images as listed and agreed with the findings in the report. MR Abdomen W Wo Contrast  Result Date: 03/11/2020 CLINICAL DATA:  Follow-up of left adrenal nodule. EXAM: MRI ABDOMEN WITHOUT AND WITH CONTRAST TECHNIQUE: Multiplanar multisequence MR imaging of the abdomen was performed both before and after the administration of intravenous contrast. CONTRAST:  25mL GADAVIST GADOBUTROL 1 MMOL/ML IV SOLN COMPARISON:  MRI abdomen January 01, 2019 and CT abdomen pelvis January 01, 2019 FINDINGS: Despite efforts by the technologist and patient, motion artifact is present on today's exam and could not be eliminated. This reduces exam sensitivity and specificity. Lower chest: No acute abnormality. Hepatobiliary: Mild diffuse hepatic steatosis. No suspicious hepatic lesions. Gallbladder is mildly distended without evidence of inflammation commonly seen in fasting state. No biliary ductal dilation. Pancreas: Mild senescent atrophy of the pancreas. No evidence of mass or acute inflammation. No pancreatic divisum. Spleen: Again seen are the cystic  multi-septated splenic lesions with mild postcontrast enhancement of the septations but no internal lesional enhancement, consistent with benign lymphangiomas. Adrenals/Urinary Tract: Unchanged size of the solid arterially enhancing left adrenal nodule which measures 2.9 x 1.9 cm on image 47/14, with signal loss of approximately 13% on out of phase imaging and no intrinsic macroscopic fat. The right adrenal gland is unremarkable. Bilateral cystic renal lesions the largest of which is in the interpolar region on the right and measures 3.1 cm on image 20/3. No suspicious enhancing renal lesions. Stomach/Bowel: Visualized portions within the abdomen are unremarkable. Vascular/Lymphatic: No pathologically enlarged lymph nodes identified. No abdominal aortic aneurysm demonstrated. Other:  None. Musculoskeletal: No suspicious bone lesions identified. IMPRESSION: 1. Unchanged size of the solid arterially enhancing left adrenal nodule measuring 2.9 cm, which most likely represents a lipid poor adenoma given the 2 years of stability, imaging characteristics and no history of primary neoplasm. However, recommend biochemical workup for functioning adenoma and pheochromocytoma if not previously performed. Additionally, further characterization of this lesion could be obtained through adrenal protocol CT with and without contrast if clinically indicated. 2. Mild diffuse hepatic steatosis. 3. Benign splenic lymphangiomas. 4. Bilateral cystic renal lesions, no suspicious enhancing renal lesions on this study which is slightly limited by respiratory motion artifact. Electronically Signed   By: Dahlia Bailiff MD   On: 03/11/2020 16:30       ASSESSMENT & PLAN:  1. Normocytic anemia   2. Adrenal mass ([place])    #Anemia, due to chronic kidney disease Labs reviewed and discussed with patient Hemoglobin has improved to 11.8 Recommend patient to continue oral  iron supplementation 1-2 times daily as maintenance.  #Adrenal  mass, followed by urology.  Was recently seen by Dr. Erlene Quan who recommends no additional surveillance/follow-up as needed...  Orders Placed This Encounter  Procedures  . CBC with Differential/Platelet    Standing Status:   Future    Standing Expiration Date:   03/25/2021  . Ferritin    Standing Status:   Future    Standing Expiration Date:   03/25/2021  . Iron and TIBC    Standing Status:   Future    Standing Expiration Date:   03/25/2021    All questions were answered. The patient knows to call the clinic with any problems questions or concerns.  Follow-up in 12 months. Earlie Server, MD, PhD Hematology Oncology [facility] Pager- [phone] 03/25/2020

## 2020-04-05 ENCOUNTER — Other Ambulatory Visit: Payer: Self-pay | Admitting: Oncology

## 2020-05-03 ENCOUNTER — Other Ambulatory Visit: Payer: Self-pay | Admitting: Family

## 2020-05-03 DIAGNOSIS — K219 Gastro-esophageal reflux disease without esophagitis: Secondary | ICD-10-CM

## 2020-06-02 ENCOUNTER — Other Ambulatory Visit: Payer: Self-pay | Admitting: Family

## 2020-06-02 DIAGNOSIS — E785 Hyperlipidemia, unspecified: Secondary | ICD-10-CM

## 2020-06-10 ENCOUNTER — Other Ambulatory Visit: Payer: Self-pay | Admitting: Family

## 2020-06-10 ENCOUNTER — Other Ambulatory Visit: Payer: Self-pay

## 2020-06-10 DIAGNOSIS — I10 Essential (primary) hypertension: Secondary | ICD-10-CM

## 2020-06-15 ENCOUNTER — Ambulatory Visit: Payer: Medicare PPO | Admitting: Family

## 2020-06-17 ENCOUNTER — Encounter: Payer: Self-pay | Admitting: Family

## 2020-06-17 ENCOUNTER — Other Ambulatory Visit: Payer: Self-pay

## 2020-06-17 ENCOUNTER — Ambulatory Visit (INDEPENDENT_AMBULATORY_CARE_PROVIDER_SITE_OTHER): Payer: Medicare PPO | Admitting: Family

## 2020-06-17 DIAGNOSIS — I10 Essential (primary) hypertension: Secondary | ICD-10-CM

## 2020-06-17 DIAGNOSIS — J309 Allergic rhinitis, unspecified: Secondary | ICD-10-CM

## 2020-06-17 NOTE — Assessment & Plan Note (Signed)
Historically controlled and advised to spot check at home. Continue amlodipine 10mg  losartan 50mg , metoprolol tartrate 25mg  bid

## 2020-06-17 NOTE — Assessment & Plan Note (Signed)
Afebrile. No acute respiratory distress. Advised home covid test. Patient would like to trial OTC flonase and if no improvement, she will let us know.

## 2020-06-17 NOTE — Progress Notes (Signed)
Verbal consent for services obtained from patient prior to services given to TELEPHONE visit:   Location of call:  provider at work patient at home  Names of all persons present for services: Mable Paris, NP and patient Acute visit for cough and follow up  Dry cough started 5 days ago, with improvement.  Endorses PND, nasal congestion.   No fever, sob, wheezing, sob, facial pain, pain swelling.  Compliant with mucinex twice with relief.   H/o seasonal allergies.   Covid vaccinated.   Following with Dr Tasia Catchings for microcytic anemia. Advised to continue iron supplementation  H/o left adrenal mass- following with urology, Dr Erlene Quan whom recommend no further evaluation.   Htn- compliant amlodipine 10mg  losartan 50mg , metoprolol tartrate 25mg  bid. No cp.    BP Readings from Last 3 Encounters:  03/25/20 127/82  03/16/20 (!) 143/80  12/16/19 120/76      A/P/next steps:  Problem List Items Addressed This Visit       Cardiovascular and Mediastinum   Hypertension    Historically controlled and advised to spot check at home. Continue amlodipine 10mg  losartan 50mg , metoprolol tartrate 25mg  bid         Respiratory   Allergic rhinitis    Afebrile. No acute respiratory distress. Advised home covid test. Patient would like to trial OTC flonase and if no improvement, she will let us know.          I spent 15 min  discussing plan of care over the phone.

## 2020-06-29 ENCOUNTER — Encounter (INDEPENDENT_AMBULATORY_CARE_PROVIDER_SITE_OTHER): Payer: Self-pay | Admitting: Ophthalmology

## 2020-06-29 ENCOUNTER — Ambulatory Visit (INDEPENDENT_AMBULATORY_CARE_PROVIDER_SITE_OTHER): Payer: Medicare PPO | Admitting: Ophthalmology

## 2020-06-29 ENCOUNTER — Other Ambulatory Visit: Payer: Self-pay | Admitting: Family

## 2020-06-29 ENCOUNTER — Other Ambulatory Visit: Payer: Self-pay

## 2020-06-29 DIAGNOSIS — H43811 Vitreous degeneration, right eye: Secondary | ICD-10-CM | POA: Diagnosis not present

## 2020-06-29 DIAGNOSIS — H35352 Cystoid macular degeneration, left eye: Secondary | ICD-10-CM

## 2020-06-29 DIAGNOSIS — Z9889 Other specified postprocedural states: Secondary | ICD-10-CM

## 2020-06-29 DIAGNOSIS — K219 Gastro-esophageal reflux disease without esophagitis: Secondary | ICD-10-CM

## 2020-06-29 NOTE — Assessment & Plan Note (Addendum)
Chronic recurrent CME now controlled patient using ketorolac maybe once a day maybe only 5 times a week 4 times a week  We will asked patient to keep the topical drop until it expires but thereafter not to use topical medications but report promptly of new onset visual acuity decline distortions or blurriness particularly with her glasses on while reading up close monocular fashion left eye

## 2020-06-29 NOTE — Assessment & Plan Note (Signed)
No holes or tears, not pathologic

## 2020-06-29 NOTE — Progress Notes (Signed)
06/29/2020     CHIEF COMPLAINT Patient presents for Retina Follow Up (1 Year F/U OU//Pt reports stable VA OU. Pt reports intermittent rare floaters OU. Pt denies ocular pain or flashes of light OU. Pt reports intermittent "red spots" on eye ball.)   HISTORY OF PRESENT ILLNESS: Jennifer Huff is a 70 y.o. female who presents to the clinic today for:   HPI     Retina Follow Up           Diagnosis: Other   Laterality: both eyes   Onset: 1 year ago   Severity: mild   Duration: 1 year   Course: stable   Comments: 1 Year F/U OU  Pt reports stable VA OU. Pt reports intermittent rare floaters OU. Pt denies ocular pain or flashes of light OU. Pt reports intermittent "red spots" on eye ball.       Last edited by Milly Jakob, [place] on 06/29/2020 10:42 AM.      Referring physician: Burnard Hawthorne, FNP [address] [place],  [place] [address]  HISTORICAL INFORMATION:   Selected notes from the MEDICAL RECORD NUMBER    Lab Results  Component Value Date   HGBA1C 6.0 (H) 09/16/2019     CURRENT MEDICATIONS: Current Outpatient Medications (Ophthalmic Drugs)  Medication Sig   ketorolac (ACULAR) 0.4 % SOLN Place 1 drop into the left eye 2 (two) times daily as needed (eye pain).   No current facility-administered medications for this visit. (Ophthalmic Drugs)   Current Outpatient Medications (Other)  Medication Sig   acetaminophen (TYLENOL) 500 MG tablet Take 1,000 mg by mouth daily as needed for mild pain.   amLODipine (NORVASC) 10 MG tablet Take 1 tablet (10 mg total) by mouth daily.   Cholecalciferol (VITAMIN D3) 2000 units TABS Take 2,000 Units by mouth at bedtime.    docusate sodium (COLACE) 100 MG capsule Take 1 capsule (100 mg total) by mouth daily.   famotidine (PEPCID) 20 MG tablet Take 1 tablet (20 mg total) by mouth 2 (two) times daily as needed for heartburn or indigestion.   FEROSUL 325 (65 Fe) MG tablet Take 1 tablet (325 mg total) by mouth 2  (two) times daily with a meal. (Patient taking differently: Take 325 mg by mouth daily with breakfast.)   losartan (COZAAR) 50 MG tablet Take 1 tablet (50 mg total) by mouth daily.   metoprolol tartrate (LOPRESSOR) 25 MG tablet Take 1 tablet (25 mg total) by mouth 2 (two) times daily.   polyethylene glycol powder (GLYCOLAX/MIRALAX) 17 GM/SCOOP powder Take 1 Container by mouth as needed. Take one capful mix into water as needed for constipation.   pravastatin (PRAVACHOL) 40 MG tablet Take 1 tablet (40 mg total) by mouth daily.   No current facility-administered medications for this visit. (Other)      REVIEW OF SYSTEMS:    ALLERGIES Allergies  Allergen Reactions   Ace Inhibitors Swelling    Lips swelled.   Codeine Other (See Comments)    Jittery     PAST MEDICAL HISTORY Past Medical History:  Diagnosis Date   Cataract    GERD (gastroesophageal reflux disease)    HTN (hypertension)    Hypercholesteremia    Vitamin D deficiency    Past Surgical History:  Procedure Laterality Date   COLONOSCOPY WITH PROPOFOL N/A 05/06/2019   Procedure: COLONOSCOPY WITH PROPOFOL;  Surgeon: Virgel Manifold, MD;  Location: [facility];  Service: Endoscopy;  Laterality: N/A;   KNEE ARTHROSCOPY  WITH LATERAL MENISECTOMY Right 11/06/2017   Procedure: KNEE ARTHROSCOPY WITH REPAIR LATERAL MENISCAL ROOT TEAR;  Surgeon: Corky Mull, MD;  Location: [facility] ORS;  Service: Orthopedics;  Laterality: Right;   LAPAROSCOPIC HYSTERECTOMY      FAMILY HISTORY Family History  Problem Relation Age of Onset   Kidney disease Father    Heart disease Father        died from mi   Cancer Father        prostate   Hypertension Sister    Colon polyps Sister    Hypertension Brother    Hypertension Sister    Diabetes Mother    Hypertension Mother    Cancer Paternal Grandmother        head and neck   Hypertension Brother    Cancer Son    Colon cancer Neg Hx    Breast cancer Neg Hx    Adrenal disorder Neg  Hx     SOCIAL HISTORY Social History   Tobacco Use   Smoking status: Former    Pack years: 0.00    Types: Cigarettes    Quit date: 12/07/1997    Years since quitting: 22.5   Smokeless tobacco: Never  Vaping Use   Vaping Use: Never used  Substance Use Topics   Alcohol use: Not Currently   Drug use: No         OPHTHALMIC EXAM:  Base Eye Exam     Visual Acuity (ETDRS)       Right Left   Dist cc 20/20 -2 20/25 -1    Correction: Glasses         Tonometry (Tonopen, 10:47 AM)       Right Left   Pressure 12 18         Pupils       Pupils Dark Light Shape React APD   Right PERRL 5 4 Round Brisk None   Left PERRL 5 4 Round Brisk None         Visual Fields (Counting fingers)       Left Right    Full Full         Extraocular Movement       Right Left    Full Full         Neuro/Psych     Oriented x3: Yes   Mood/Affect: Normal         Dilation     Both eyes: 1.0% Mydriacyl, 2.5% Phenylephrine @ 10:47 AM           Slit Lamp and Fundus Exam     External Exam       Right Left   External Normal Normal         Slit Lamp Exam       Right Left   Lids/Lashes Normal Normal   Conjunctiva/Sclera White and quiet White and quiet   Cornea Clear Clear   Anterior Chamber Deep and quiet Deep and quiet   Iris Round and reactive, no TI defects Round and reactive, no TI defects   Lens  Centered posterior chamber intraocular lens Clear   Anterior Vitreous Normal Normal         Fundus Exam       Right Left   Posterior Vitreous Normal Vitrectomized, clear   Disc Normal Normal   C/D Ratio 0.3 0.45   Macula Normal Normal   Vessels Normal Normal   Periphery Normal Normal  IMAGING AND PROCEDURES  Imaging and Procedures for 06/29/20  OCT, Retina - OU - Both Eyes       Right Eye Quality was good. Scan locations included subfoveal. Central Foveal Thickness: 271. Progression has been stable. Findings include normal  observations.   Left Eye Quality was good. Scan locations included subfoveal. Central Foveal Thickness: 299. Progression has been stable. Findings include normal observations.   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 Cystoid macular edema of left eye  H35.352 OCT, Retina - OU - Both Eyes    2. Posterior vitreous detachment of right eye  H43.811 OCT, Retina - OU - Both Eyes    3. History of vitrectomy  Z98.890       1.  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  Patient has been on topical ketorolac most of the last 6 to 9 years.  Recently she has used it intermittently at best perhaps 4-5 times weekly.  We will thus discontinue topical NSAID OS.  2.  Patient asked to report any new onset visual acuity declines or distortions  3.  Ophthalmic Meds Ordered this visit:  No orders of the defined types were placed in this encounter.      Return in about 1 year (around 06/29/2021) for DILATE OU, OCT.  There are no Patient Instructions on file for this visit.   Explained the diagnoses, plan, and follow up with the patient and they expressed understanding.  Patient expressed understanding of the importance of proper follow up care.   Clent Demark [place] Markgraf M.D. Diseases & Surgery of the Retina and Vitreous [facility] Diabetic [place] 06/29/20     Abbreviations: M myopia (nearsighted); A astigmatism; H hyperopia (farsighted); P presbyopia; Mrx spectacle prescription;  CTL contact lenses; OD right eye; OS left eye; OU both eyes  XT exotropia; ET esotropia; PEK punctate epithelial keratitis; PEE punctate epithelial erosions; DES dry eye syndrome; MGD meibomian gland dysfunction; ATs artificial tears; PFAT's preservative free artificial tears; [place] nuclear sclerotic cataract; PSC posterior subcapsular cataract; ERM epi-retinal membrane; PVD posterior vitreous detachment; RD retinal detachment; DM diabetes mellitus; DR diabetic retinopathy; NPDR non-proliferative diabetic retinopathy; PDR proliferative diabetic retinopathy; CSME clinically significant macular edema; DME diabetic macular edema; dbh dot blot hemorrhages; CWS cotton wool spot; POAG primary open angle glaucoma; C/D cup-to-disc ratio; HVF humphrey visual field; GVF goldmann visual field; OCT optical coherence tomography; IOP intraocular pressure; BRVO Branch retinal vein occlusion; CRVO central retinal vein occlusion; CRAO central retinal artery occlusion; BRAO branch retinal artery occlusion; RT retinal tear; SB scleral buckle; PPV pars plana vitrectomy; VH Vitreous hemorrhage; PRP panretinal laser photocoagulation; IVK intravitreal kenalog; VMT vitreomacular traction; MH Macular hole;  NVD neovascularization of the disc; NVE neovascularization elsewhere; AREDS age related eye disease study; ARMD age related macular degeneration; POAG primary open  angle glaucoma; EBMD epithelial/anterior basement membrane dystrophy; ACIOL anterior chamber intraocular lens; IOL intraocular lens; PCIOL posterior chamber intraocular lens; Phaco/IOL phacoemulsification with intraocular lens placement; [place] photorefractive keratectomy; LASIK laser assisted in situ keratomileusis; HTN hypertension; DM diabetes mellitus; COPD chronic obstructive pulmonary disease

## 2020-08-19 ENCOUNTER — Other Ambulatory Visit: Payer: Self-pay | Admitting: Family

## 2020-08-19 DIAGNOSIS — Z1231 Encounter for screening mammogram for malignant neoplasm of breast: Secondary | ICD-10-CM

## 2020-09-02 ENCOUNTER — Other Ambulatory Visit: Payer: Self-pay | Admitting: Family

## 2020-09-02 DIAGNOSIS — E785 Hyperlipidemia, unspecified: Secondary | ICD-10-CM

## 2020-09-08 ENCOUNTER — Other Ambulatory Visit: Payer: Self-pay

## 2020-09-08 ENCOUNTER — Ambulatory Visit
Admission: RE | Admit: 2020-09-08 | Discharge: 2020-09-08 | Disposition: A | Discharge status: Home / Self Care | Payer: Medicare PPO | Source: Ambulatory Visit | Attending: Family | Admitting: Family

## 2020-09-08 DIAGNOSIS — Z1231 Encounter for screening mammogram for malignant neoplasm of breast: Secondary | ICD-10-CM | POA: Insufficient documentation

## 2020-10-14 ENCOUNTER — Ambulatory Visit (INDEPENDENT_AMBULATORY_CARE_PROVIDER_SITE_OTHER): Payer: Medicare PPO

## 2020-10-14 ENCOUNTER — Other Ambulatory Visit: Payer: Self-pay

## 2020-10-14 VITALS — BP 147/73 | HR 69 | Temp 96.9°F | Ht 66.0 in | Wt 265.4 lb

## 2020-10-14 DIAGNOSIS — Z23 Encounter for immunization: Secondary | ICD-10-CM | POA: Diagnosis not present

## 2020-10-14 DIAGNOSIS — Z0000 Encounter for general adult medical examination without abnormal findings: Secondary | ICD-10-CM | POA: Diagnosis not present

## 2020-10-14 NOTE — Patient Instructions (Addendum)
[name] , Thank you for taking time to come for your Medicare Wellness Visit. I appreciate your ongoing commitment to your health goals. Please review the following plan we discussed and let me know if I can assist you in the future.   These are the goals we discussed:  Goals       Patient Stated     Increase physical activity (pt-stated)      I will start walking more for exercise         This is a list of the screening recommended for you and due dates:  Health Maintenance  Topic Date Due   Tetanus Vaccine  06/08/2021   Mammogram  09/09/2022   Colon Cancer Screening  05/05/2024   Flu Shot  Completed   DEXA scan (bone density measurement)  Completed   COVID-19 Vaccine  Completed   Hepatitis C Screening: USPSTF Recommendation to screen - Ages 69-79 yo.  Completed   Zoster (Shingles) Vaccine  Completed   HPV Vaccine  Aged Out   Advanced directives: not yet filed  Conditions/risks identified: none new  Follow up in one year for your annual wellness visit    Preventive Care 65 Years and Older, Female Preventive care refers to lifestyle choices and visits with your health care provider that can promote health and wellness. What does preventive care include? A yearly physical exam. This is also called an annual well check. Dental exams once or twice a year. Routine eye exams. Ask your health care provider how often you should have your eyes checked. Personal lifestyle choices, including: Daily care of your teeth and gums. Regular physical activity. Eating a healthy diet. Avoiding tobacco and drug use. Limiting alcohol use. Practicing safe sex. Taking low-dose aspirin every day. Taking vitamin and mineral supplements as recommended by your health care provider. What happens during an annual well check? The services and screenings done by your health care provider during your annual well check will depend on your age, overall health, lifestyle risk factors, and  family history of disease. Counseling  Your health care provider may ask you questions about your: Alcohol use. Tobacco use. Drug use. Emotional well-being. Home and relationship well-being. Sexual activity. Eating habits. History of falls. Memory and ability to understand (cognition). Work and work Statistician. Reproductive health. Screening  You may have the following tests or measurements: Height, weight, and BMI. Blood pressure. Lipid and cholesterol levels. These may be checked every 5 years, or more frequently if you are over 69 years old. Skin check. Lung cancer screening. You may have this screening every year starting at age 69 if you have a 30-pack-year history of smoking and currently smoke or have quit within the past 15 years. Fecal occult blood test (FOBT) of the stool. You may have this test every year starting at age 69. Flexible sigmoidoscopy or colonoscopy. You may have a sigmoidoscopy every 5 years or a colonoscopy every 10 years starting at age 69. Hepatitis C blood test. Hepatitis B blood test. Sexually transmitted disease (STD) testing. Diabetes screening. This is done by checking your blood sugar (glucose) after you have not eaten for a while (fasting). You may have this done every 1-3 years. Bone density scan. This is done to screen for osteoporosis. You may have this done starting at age 69. Mammogram. This may be done every 1-2 years. Talk to your health care provider about how often you should have regular mammograms. Talk with your health care provider about your test  results, treatment options, and if necessary, the need for more tests. Vaccines  Your health care provider may recommend certain vaccines, such as: Influenza vaccine. This is recommended every year. Tetanus, diphtheria, and acellular pertussis (Tdap, Td) vaccine. You may need a Td booster every 10 years. Zoster vaccine. You may need this after age 69. Pneumococcal 13-valent conjugate  (PCV13) vaccine. One dose is recommended after age 69. Pneumococcal polysaccharide (PPSV23) vaccine. One dose is recommended after age 69. Talk to your health care provider about which screenings and vaccines you need and how often you need them. This information is not intended to replace advice given to you by your health care provider. Make sure you discuss any questions you have with your health care provider. Document Released: 01/15/2015 Document Revised: 09/08/2015 Document Reviewed: 10/20/2014 Elsevier Interactive Patient Education  2017 [place] Prevention in the Home Falls can cause injuries. They can happen to people of all ages. There are many things you can do to make your home safe and to help prevent falls. What can I do on the outside of my home? Regularly fix the edges of walkways and driveways and fix any cracks. Remove anything that might make you trip as you walk through a door, such as a raised step or threshold. Trim any bushes or trees on the path to your home. Use bright outdoor lighting. Clear any walking paths of anything that might make someone trip, such as rocks or tools. Regularly check to see if handrails are loose or broken. Make sure that both sides of any steps have handrails. Any raised decks and porches should have guardrails on the edges. Have any leaves, snow, or ice cleared regularly. Use sand or salt on walking paths during winter. Clean up any spills in your garage right away. This includes oil or grease spills. What can I do in the bathroom? Use night lights. Install grab bars by the toilet and in the tub and shower. Do not use towel bars as grab bars. Use non-skid mats or decals in the tub or shower. If you need to sit down in the shower, use a plastic, non-slip stool. Keep the floor dry. Clean up any water that spills on the floor as soon as it happens. Remove soap buildup in the tub or shower regularly. Attach bath mats securely with  double-sided non-slip rug tape. Do not have throw rugs and other things on the floor that can make you trip. What can I do in the bedroom? Use night lights. Make sure that you have a light by your bed that is easy to reach. Do not use any sheets or blankets that are too big for your bed. They should not hang down onto the floor. Have a firm chair that has side arms. You can use this for support while you get dressed. Do not have throw rugs and other things on the floor that can make you trip. What can I do in the kitchen? Clean up any spills right away. Avoid walking on wet floors. Keep items that you use a lot in easy-to-reach places. If you need to reach something above you, use a strong step stool that has a grab bar. Keep electrical cords out of the way. Do not use floor polish or wax that makes floors slippery. If you must use wax, use non-skid floor wax. Do not have throw rugs and other things on the floor that can make you trip. What can I do with my stairs?  Do not leave any items on the stairs. Make sure that there are handrails on both sides of the stairs and use them. Fix handrails that are broken or loose. Make sure that handrails are as long as the stairways. Check any carpeting to make sure that it is firmly attached to the stairs. Fix any carpet that is loose or worn. Avoid having throw rugs at the top or bottom of the stairs. If you do have throw rugs, attach them to the floor with carpet tape. Make sure that you have a light switch at the top of the stairs and the bottom of the stairs. If you do not have them, ask someone to add them for you. What else can I do to help prevent falls? Wear shoes that: Do not have high heels. Have rubber bottoms. Are comfortable and fit you well. Are closed at the toe. Do not wear sandals. If you use a stepladder: Make sure that it is fully opened. Do not climb a closed stepladder. Make sure that both sides of the stepladder are locked  into place. Ask someone to hold it for you, if possible. Clearly mark and make sure that you can see: Any grab bars or handrails. First and last steps. Where the edge of each step is. Use tools that help you move around (mobility aids) if they are needed. These include: Canes. Walkers. Scooters. Crutches. Turn on the lights when you go into a dark area. Replace any light bulbs as soon as they burn out. Set up your furniture so you have a clear path. Avoid moving your furniture around. If any of your floors are uneven, fix them. If there are any pets around you, be aware of where they are. Review your medicines with your doctor. Some medicines can make you feel dizzy. This can increase your chance of falling. Ask your doctor what other things that you can do to help prevent falls. This information is not intended to replace advice given to you by your health care provider. Make sure you discuss any questions you have with your health care provider. Document Released: 10/15/2008 Document Revised: 05/27/2015 Document Reviewed: 01/23/2014 Elsevier Interactive Patient Education  2017 [employer].

## 2020-10-14 NOTE — Progress Notes (Addendum)
Subjective:   Jennifer Huff is a 70 y.o. female who presents for Medicare Annual (Subsequent) preventive examination.  Review of Systems    No ROS.  Medicare Wellness    Cardiac Risk Factors include: advanced age (>42men, >52 women);hypertension     Objective:    Today's Vitals   10/14/20 0909  BP: (!) 147/73  Pulse: 69  Temp: (!) 96.9 F (36.1 C)  SpO2: 97%  Weight: 265 lb 6.4 oz (120.4 kg)   Body mass index is 42.84 kg/m.  Advanced Directives 10/14/2020 10/14/2019 09/25/2019 06/23/2019 05/06/2019 02/12/2019 02/05/2019  Does Patient Have a Medical Advance Directive? No No No No No No No  Would patient like information on creating a medical advance directive? No - Patient declined No - Patient declined - Yes (MAU/Ambulatory/Procedural Areas - Information given) - No - Patient declined -    Current Medications (verified) Outpatient Encounter Medications as of 10/14/2020  Medication Sig   acetaminophen (TYLENOL) 500 MG tablet Take 1,000 mg by mouth daily as needed for mild pain.   amLODipine (NORVASC) 10 MG tablet Take 1 tablet (10 mg total) by mouth daily.   Cholecalciferol (VITAMIN D3) 2000 units TABS Take 2,000 Units by mouth at bedtime.    docusate sodium (COLACE) 100 MG capsule Take 1 capsule (100 mg total) by mouth daily.   famotidine (PEPCID) 20 MG tablet Take 1 tablet (20 mg total) by mouth 2 (two) times daily as needed for heartburn or indigestion.   FEROSUL 325 (65 Fe) MG tablet Take 1 tablet (325 mg total) by mouth 2 (two) times daily with a meal. (Patient taking differently: Take 325 mg by mouth daily with breakfast.)   losartan (COZAAR) 50 MG tablet Take 1 tablet (50 mg total) by mouth daily.   metoprolol tartrate (LOPRESSOR) 25 MG tablet Take 1 tablet (25 mg total) by mouth 2 (two) times daily.   polyethylene glycol powder (GLYCOLAX/MIRALAX) 17 GM/SCOOP powder Take 1 Container by mouth as needed. Take one capful mix into water as needed for constipation.    pravastatin (PRAVACHOL) 40 MG tablet Take 1 tablet (40 mg total) by mouth daily.   No facility-administered encounter medications on file as of 10/14/2020.    Allergies (verified) Ace inhibitors and Codeine   History: Past Medical History:  Diagnosis Date   Cataract    GERD (gastroesophageal reflux disease)    HTN (hypertension)    Hypercholesteremia    Vitamin D deficiency    Past Surgical History:  Procedure Laterality Date   COLONOSCOPY WITH PROPOFOL N/A 05/06/2019   Procedure: COLONOSCOPY WITH PROPOFOL;  Surgeon: Virgel Manifold, MD;  Location: [facility] ENDOSCOPY;  Service: Endoscopy;  Laterality: N/A;   KNEE ARTHROSCOPY WITH LATERAL MENISECTOMY Right 11/06/2017   Procedure: KNEE ARTHROSCOPY WITH REPAIR LATERAL MENISCAL ROOT TEAR;  Surgeon: Corky Mull, MD;  Location: [facility] ORS;  Service: Orthopedics;  Laterality: Right;   LAPAROSCOPIC HYSTERECTOMY     Family History  Problem Relation Age of Onset   Kidney disease Father    Heart disease Father        died from mi   Cancer Father        prostate   Hypertension Sister    Colon polyps Sister    Hypertension Brother    Hypertension Sister    Diabetes Mother    Hypertension Mother    Cancer Paternal Grandmother        head and neck   Hypertension Brother    Cancer  Son    Colon cancer Neg Hx    Breast cancer Neg Hx    Adrenal disorder Neg Hx    Social History   Socioeconomic History   Marital status: Married    Spouse name: Not on file   Number of children: 3   Years of education: Not on file   Highest education level: Not on file  Occupational History   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  Tobacco Use   Smoking status: Former    Types: Cigarettes    Quit date: 12/07/1997    Years since quitting: 22.8   Smokeless tobacco: Never  Vaping Use   Vaping Use: Never used  Substance and Sexual Activity   Alcohol use: Not Currently   Drug use: No   Sexual activity: Not Currently  Other Topics Concern    Not on file  Social History Narrative   Regular Exercise -  NO   Daily Caffeine Use:  2-3 soda/sw tea      Retired   Investment banker, operational of Radio broadcast assistant Strain: Low Risk    Difficulty of Paying Living Expenses: Not hard at all  Food Insecurity: No Food Insecurity   Worried About Charity fundraiser in the Last Year: Never true   Arboriculturist in the Last Year: Never true  Transportation Needs: No Transportation Needs   Lack of Transportation (Medical): No   Lack of Transportation (Non-Medical): No  Physical Activity: Not on file  Stress: No Stress Concern Present   Feeling of Stress : Not at all  Social Connections: Unknown   Frequency of Communication with Friends and Family: Not on file   Frequency of Social Gatherings with Friends and Family: Not on file   Attends Religious Services: Not on file   Active Member of Clubs or Organizations: Not on file   Attends Archivist Meetings: Not on file   Marital Status: Married    Tobacco Counseling Counseling given: Not Answered   Clinical Intake:  Pre-visit preparation completed: Yes        Diabetes: No  How often do you need to have someone help you when you read instructions, pamphlets, or other written materials from your doctor or pharmacy?: 1 - Never  Interpreter Needed?: No      Activities of Daily Living In your present state of health, do you have any difficulty performing the following activities: 10/14/2020  Hearing? N  Vision? N  Difficulty concentrating or making decisions? N  Walking or climbing stairs? N  Dressing or bathing? N  Doing errands, shopping? N  Preparing Food and eating ? N  Using the Toilet? N  In the past six months, have you accidently leaked urine? N  Do you have problems with loss of bowel control? N  Managing your Medications? N  Managing your Finances? N  Housekeeping or managing your Housekeeping? N  Some recent data might be hidden    Patient  Care Team: Burnard Hawthorne, FNP as PCP - General (Family Medicine) Rockey Situ Kathlene November, MD as PCP - Cardiology (Cardiology) Clent Jacks, RN as Oncology Nurse Navigator Earlie Server, MD as Consulting Physician (Hematology and Oncology) [place], [place] any recent Medical Services you may have received from other than [facility] providers in the past year (date may be approximate).     Assessment:   This is a routine wellness examination for [place].  Hearing/Vision screen Hearing Screening -  Comments:: Patient is able to hear conversational tones without difficulty.  No issues reported. Vision Screening - Comments:: Cataracts extracted  Dietary issues and exercise activities discussed: Current Exercise Habits: Home exercise routine, Time (Minutes): 10, Frequency (Times/Week): 5, Weekly Exercise (Minutes/Week): 50, Intensity: Mild  Regular diet Fair water intake   Goals Addressed               This Visit's Progress     Patient Stated     Increase physical activity (pt-stated)        I will start walking more for exercise        Depression Screen PHQ 2/9 Scores 10/14/2020 10/14/2019 09/16/2019 01/08/2019 10/11/2018 07/29/2018 07/13/2016  PHQ - 2 Score 0 0 0 0 0 0 0  PHQ- 9 Score - 0 0 1 - 0 -    Fall Risk Fall Risk  10/14/2020 10/14/2019 08/13/2019 10/11/2018 07/13/2016  Falls in the past year? 0 0 0 0 No  Number falls in past yr: 0 0 0 - -  Injury with Fall? - - 0 - -  Follow up Falls evaluation completed Falls evaluation completed Falls evaluation completed - -    FALL RISK PREVENTION PERTAINING TO THE HOME: Adequate lighting in your home to reduce risk of falls? Yes   ASSISTIVE DEVICES UTILIZED TO PREVENT FALLS: Use of a cane, walker or w/c? No   TIMED UP AND GO: Was the test performed? Yes .  Length of time to ambulate 10 feet: 5 sec.   Gait steady and fast without use of assistive device  Cognitive Function:  Patient is alert and oriented  x3.   6CIT Screen 10/14/2020 10/11/2018  What Year? 0 points 0 points  What month? 0 points 0 points  What time? 0 points 0 points  Count back from 20 0 points 0 points  Months in reverse 0 points 0 points  Repeat phrase 0 points 0 points  Total Score 0 0    Immunizations Immunization History  Administered Date(s) Administered   Fluad Quad(high Dose 65+) 09/28/2018, 09/16/2019, 10/14/2020   Influenza Split 09/08/2010   Influenza,inj,Quad PF,6+ Mos 10/19/2016   Influenza-Unspecified 10/10/2011, 10/18/2012, 10/17/2013, 10/23/2014, 10/22/2015, 11/05/2017   Moderna Sars-Covid-2 Vaccination 03/06/2019, 04/03/2019, 11/10/2019, 08/02/2020   Pneumococcal Conjugate-13 04/18/2017, 09/16/2019   Tdap 06/09/2011   Zoster Recombinat (Shingrix) 06/11/2017, 09/06/2017   Zoster, Live 09/17/2011   Flu Vaccine status: Completed at today's visit  Health Maintenance Health Maintenance  Topic Date Due   TETANUS/TDAP  06/08/2021   MAMMOGRAM  09/09/2022   COLONOSCOPY (Pts 45-55yrs Insurance coverage will need to be confirmed)  05/05/2024   INFLUENZA VACCINE  Completed   DEXA SCAN  Completed   COVID-19 Vaccine  Completed   Hepatitis C Screening  Completed   Zoster Vaccines- Shingrix  Completed   HPV VACCINES  Aged Out   Lung Cancer Screening: (Low Dose CT Chest recommended if Age 53-80 years, 30 pack-year currently smoking OR have quit w/in 15years.) does not qualify. DG Chest  view completed 10/2019.  Vision Screening: Recommended annual ophthalmology exams for early detection of glaucoma and other disorders of the eye.  Dental Screening: Recommended annual dental exams for proper oral hygiene. Dentures.   Community Resource Referral / Chronic Care Management: CRR required this visit?  No   CCM required this visit?  No      Plan:   Keep all routine maintenance appointments.   I have personally reviewed and noted the following in the patient's chart:  Medical and social history Use  of alcohol, tobacco or illicit drugs  Current medications and supplements including opioid prescriptions. Not taking opioid. Functional ability and status Nutritional status Physical activity Advanced directives List of other physicians Hospitalizations, surgeries, and ER visits in previous 12 months Vitals Screenings to include cognitive, depression, and falls Referrals and appointments  In addition, I have reviewed and discussed with patient certain preventive protocols, quality metrics, and best practice recommendations. A written personalized care plan for preventive services as well as general preventive health recommendations were provided to patient.     OBrien-Blaney, Hildagard Sobecki L, LPN   28/20/8138    I have reviewed the above information and agree with above.   Deborra Medina, MD

## 2020-10-27 ENCOUNTER — Other Ambulatory Visit: Payer: Self-pay | Admitting: Family

## 2020-10-27 DIAGNOSIS — I10 Essential (primary) hypertension: Secondary | ICD-10-CM

## 2020-11-29 ENCOUNTER — Other Ambulatory Visit: Payer: Self-pay | Admitting: Family

## 2020-11-29 DIAGNOSIS — N1832 Chronic kidney disease, stage 3b: Secondary | ICD-10-CM | POA: Diagnosis not present

## 2020-11-29 DIAGNOSIS — E785 Hyperlipidemia, unspecified: Secondary | ICD-10-CM

## 2020-12-08 ENCOUNTER — Other Ambulatory Visit: Payer: Self-pay

## 2020-12-08 ENCOUNTER — Ambulatory Visit (INDEPENDENT_AMBULATORY_CARE_PROVIDER_SITE_OTHER): Payer: Medicare PPO | Admitting: Family

## 2020-12-08 ENCOUNTER — Encounter: Payer: Self-pay | Admitting: Family

## 2020-12-08 VITALS — BP 122/78 | HR 87 | Temp 96.1°F | Ht 66.0 in | Wt 263.2 lb

## 2020-12-08 DIAGNOSIS — Z23 Encounter for immunization: Secondary | ICD-10-CM

## 2020-12-08 DIAGNOSIS — E782 Mixed hyperlipidemia: Secondary | ICD-10-CM | POA: Diagnosis not present

## 2020-12-08 DIAGNOSIS — I10 Essential (primary) hypertension: Secondary | ICD-10-CM | POA: Diagnosis not present

## 2020-12-08 DIAGNOSIS — H9193 Unspecified hearing loss, bilateral: Secondary | ICD-10-CM

## 2020-12-08 DIAGNOSIS — N189 Chronic kidney disease, unspecified: Secondary | ICD-10-CM | POA: Diagnosis not present

## 2020-12-08 DIAGNOSIS — J309 Allergic rhinitis, unspecified: Secondary | ICD-10-CM

## 2020-12-08 LAB — LIPID PANEL
Cholesterol: 144 mg/dL (ref 0–200)
HDL: 56.2 mg/dL (ref >39.00)
LDL Cholesterol: 71 mg/dL (ref 0–99)
NonHDL: 87.88
Total CHOL/HDL Ratio: 3
Triglycerides: 83 mg/dL (ref 0.0–149.0)
VLDL: 16.6 mg/dL (ref 0.0–40.0)

## 2020-12-08 NOTE — Patient Instructions (Addendum)
Trial of azelastine nasal spray over the counter for allergic symptoms  Referral ENT Let us know if you dont hear back within a week in regards to an appointment being scheduled.   Nice to see you!

## 2020-12-08 NOTE — Assessment & Plan Note (Signed)
Acute on chronic. Recent URI, improving.  Reassuring exam.  Patient politely declines antibiotic therapy at this time and will let me know if symptoms do not completely resolve.  Trial of over-the-counter azelastine nasal spray.

## 2020-12-08 NOTE — Assessment & Plan Note (Signed)
Chronic, stable.recent Crt less than prior of 1.3. Requesting OV notes from Dr Royce Macadamia.

## 2020-12-08 NOTE — Progress Notes (Signed)
Subjective:    Patient ID: Jennifer Huff, female    DOB: 1950/10/28, 70 y.o.   MRN: [id number]  CC: Jennifer Huff is a 70 y.o. female who presents today for follow up.   HPI: Complains of chronic, itchy watery eyes.   Clear congestion and occasional cough for a couple of weeks near resolution. She is more concerned with chronic clear congestion. No fever, sob, wheezing, ha , facial swelling She also has concerns for hearing loss, bilaterally. No ear pain.   HTN- compliant with amlodipine 10mg  ,losartan 50mg , metoprolol tartrate 25mg  bid. No cp.    CKD- she is following with Dr Harrie Jeans. Patient had labs drawn last week 11/29/20 Hemoglobin 11.9  Anemia-she has follow-up scheduled with Dr. Tasia Catchings March of next year   Due PPSV23  HISTORY:  Past Medical History:  Diagnosis Date   Cataract    GERD (gastroesophageal reflux disease)    HTN (hypertension)    Hypercholesteremia    Vitamin D deficiency    Past Surgical History:  Procedure Laterality Date   COLONOSCOPY WITH PROPOFOL N/A 05/06/2019   Procedure: COLONOSCOPY WITH PROPOFOL;  Surgeon: Jennifer Manifold, MD;  Location: [facility] END[facility]COPY;  Service: Endoscopy;  Laterality: N/A;   KNEE ARTHROSCOPY WITH LATERAL MENISECTOMY Right 11/06/2017   Procedure: KNEE ARTHROSCOPY WITH REPAIR LATERAL MENISCAL ROOT TEAR;  Surgeon: Jennifer Mull, MD;  Location: [facility] ORS;  Service: Orthopedics;  Laterality: Right;   LAPAROSCOPIC HYSTERECTOMY     Family History  Problem Relation Age of Onset   Kidney disease Father    Heart disease Father        died from mi   Cancer Father        prostate   Hypertension Sister    Colon polyps Sister    Hypertension Brother    Hypertension Sister    Diabetes Mother    Hypertension Mother    Cancer Paternal Grandmother        head and neck   Hypertension Brother    Cancer Son    Colon cancer Neg Hx    Breast cancer Neg Hx    Adrenal disorder Neg Hx     Allergies: Ace inhibitors  and Codeine Current Outpatient Medications on File Prior to Visit  Medication Sig Dispense Refill   acetaminophen (TYLENOL) 500 MG tablet Take 1,000 mg by mouth daily as needed for mild pain.     amLODipine (NORVASC) 10 MG tablet Take 1 tablet (10 mg total) by mouth daily. 90 tablet 2   Cholecalciferol (VITAMIN D3) 2000 units TABS Take 2,000 Units by mouth at bedtime.      docusate sodium (COLACE) 100 MG capsule Take 1 capsule (100 mg total) by mouth daily. 90 capsule 0   famotidine (PEPCID) 20 MG tablet Take 1 tablet (20 mg total) by mouth 2 (two) times daily as needed for heartburn or indigestion. 120 tablet 0   FEROSUL 325 (65 Fe) MG tablet Take 1 tablet (325 mg total) by mouth 2 (two) times daily with a meal. (Patient taking differently: Take 325 mg by mouth daily with breakfast.) 180 tablet 3   losartan (COZAAR) 50 MG tablet Take 1 tablet (50 mg total) by mouth daily. 90 tablet 3   metoprolol tartrate (LOPRESSOR) 25 MG tablet Take 1 tablet (25 mg total) by mouth 2 (two) times daily. 120 tablet 0   polyethylene glycol powder (GLYCOLAX/MIRALAX) 17 GM/SCOOP powder Take 1 Container by mouth as needed. Take one capful mix  into water as needed for constipation.     pravastatin (PRAVACHOL) 40 MG tablet Take 1 tablet (40 mg total) by mouth daily. 90 tablet 0   No current facility-administered medications on file prior to visit.    Social History   Tobacco Use   Smoking status: Former    Types: Cigarettes    Quit date: 12/07/1997    Years since quitting: 23.0   Smokeless tobacco: Never  Vaping Use   Vaping Use: Never used  Substance Use Topics   Alcohol use: Not Currently   Drug use: No    Review of Systems  Constitutional:  Negative for chills and fever.  HENT:  Positive for congestion, hearing loss and postnasal drip. Negative for sore throat.   Respiratory:  Positive for cough. Negative for shortness of breath and wheezing.   Cardiovascular:  Negative for chest pain and  palpitations.  Gastrointestinal:  Negative for nausea and vomiting.     Objective:    BP 122/78 (BP Location: Left Arm, Patient Position: Sitting, Cuff Size: Large)   Pulse 87   Temp (!) 96.1 F (35.6 C) (Temporal)   Ht 5\' 6"  (1.676 m)   Wt 263 lb 3.2 oz (119.4 kg)   SpO2 99%   BMI 42.48 kg/m  BP Readings from Last 3 Encounters:  12/08/20 122/78  10/14/20 (!) 147/73  03/25/20 127/82   Wt Readings from Last 3 Encounters:  12/08/20 263 lb 3.2 oz (119.4 kg)  10/14/20 265 lb 6.4 oz (120.4 kg)  03/25/20 265 lb (120.2 kg)    Physical Exam Vitals reviewed.  Constitutional:      Appearance: She is well-developed.  HENT:     Head: Normocephalic and atraumatic.     Right Ear: Hearing, tympanic membrane, ear canal and external ear normal. No decreased hearing noted. No drainage, swelling or tenderness. No middle ear effusion. No foreign body. Tympanic membrane is not erythematous or bulging.     Left Ear: Hearing, tympanic membrane, ear canal and external ear normal. No decreased hearing noted. No drainage, swelling or tenderness.  No middle ear effusion. No foreign body. Tympanic membrane is not erythematous or bulging.     Nose: Nose normal. No rhinorrhea.     Right Sinus: No maxillary sinus tenderness or frontal sinus tenderness.     Left Sinus: No maxillary sinus tenderness or frontal sinus tenderness.     Mouth/Throat:     Pharynx: Uvula midline. No oropharyngeal exudate or posterior oropharyngeal erythema.     Tonsils: No tonsillar abscesses.  Eyes:     Conjunctiva/sclera: Conjunctivae normal.  Cardiovascular:     Rate and Rhythm: Regular rhythm.     Pulses: Normal pulses.     Heart sounds: Normal heart sounds.  Pulmonary:     Effort: Pulmonary effort is normal.     Breath sounds: Normal breath sounds. No wheezing, rhonchi or rales.  Lymphadenopathy:     Head:     Right side of head: No submental, submandibular, tonsillar, preauricular, posterior auricular or occipital  adenopathy.     Left side of head: No submental, submandibular, tonsillar, preauricular, posterior auricular or occipital adenopathy.     Cervical: No cervical adenopathy.  Skin:    General: Skin is warm and dry.  Neurological:     Mental Status: She is alert.  Psychiatric:        Speech: Speech normal.        Behavior: Behavior normal.        Thought  Content: Thought content normal.       Assessment & Plan:   Problem List Items Addressed This Visit       Cardiovascular and Mediastinum   Hypertension    Chronic, stable. Continue amlodipine 10mg  ,losartan 50mg , metoprolol tartrate 25mg  bid        Respiratory   Allergic rhinitis    Acute on chronic. Recent URI, improving.  Reassuring exam.  Patient politely declines antibiotic therapy at this time and will let me know if symptoms do not completely resolve.  Trial of over-the-counter azelastine nasal spray.        Genitourinary   CKD (chronic kidney disease)    Chronic, stable.recent Crt less than prior of 1.3. Requesting OV notes from Dr Royce Macadamia.         Other   Mixed hyperlipidemia   Relevant Orders   Lipid panel   Other Visit Diagnoses     Bilateral hearing loss, unspecified hearing loss type    -  Primary   Relevant Orders   Ambulatory referral to ENT        I am having [name]. [name] maintain her Vitamin D3, acetaminophen, losartan, docusate sodium, polyethylene glycol powder, FeroSul, amLODipine, famotidine, metoprolol tartrate, and pravastatin.   No orders of the defined types were placed in this encounter.   Return precautions given.   Risks, benefits, and alternatives of the medications and treatment plan prescribed today were discussed, and patient expressed understanding.   Education regarding symptom management and diagnosis given to patient on AVS.  Continue to follow with Burnard Hawthorne, FNP for routine health maintenance.   Loetta Rough and I agreed with plan.    Mable Paris, FNP

## 2020-12-08 NOTE — Assessment & Plan Note (Signed)
Chronic, stable. Continue amlodipine 10mg  ,losartan 50mg , metoprolol tartrate 25mg  bid

## 2020-12-09 DIAGNOSIS — N281 Cyst of kidney, acquired: Secondary | ICD-10-CM | POA: Diagnosis not present

## 2020-12-09 DIAGNOSIS — I129 Hypertensive chronic kidney disease with stage 1 through stage 4 chronic kidney disease, or unspecified chronic kidney disease: Secondary | ICD-10-CM | POA: Diagnosis not present

## 2020-12-09 DIAGNOSIS — N1831 Chronic kidney disease, stage 3a: Secondary | ICD-10-CM | POA: Diagnosis not present

## 2020-12-09 DIAGNOSIS — R7303 Prediabetes: Secondary | ICD-10-CM | POA: Diagnosis not present

## 2020-12-09 DIAGNOSIS — E278 Other specified disorders of adrenal gland: Secondary | ICD-10-CM | POA: Diagnosis not present

## 2020-12-09 DIAGNOSIS — K219 Gastro-esophageal reflux disease without esophagitis: Secondary | ICD-10-CM | POA: Diagnosis not present

## 2020-12-13 NOTE — Progress Notes (Signed)
I have sent request.

## 2020-12-23 ENCOUNTER — Other Ambulatory Visit: Payer: Self-pay | Admitting: Family

## 2020-12-23 DIAGNOSIS — I10 Essential (primary) hypertension: Secondary | ICD-10-CM

## 2021-03-03 ENCOUNTER — Other Ambulatory Visit: Payer: Self-pay | Admitting: Family

## 2021-03-03 DIAGNOSIS — I10 Essential (primary) hypertension: Secondary | ICD-10-CM

## 2021-03-08 ENCOUNTER — Other Ambulatory Visit: Payer: Self-pay | Admitting: Family

## 2021-03-08 DIAGNOSIS — E785 Hyperlipidemia, unspecified: Secondary | ICD-10-CM

## 2021-03-08 DIAGNOSIS — I10 Essential (primary) hypertension: Secondary | ICD-10-CM

## 2021-03-10 DIAGNOSIS — H59032 Cystoid macular edema following cataract surgery, left eye: Secondary | ICD-10-CM | POA: Diagnosis not present

## 2021-03-21 ENCOUNTER — Other Ambulatory Visit: Payer: Self-pay | Admitting: *Deleted

## 2021-03-21 DIAGNOSIS — D649 Anemia, unspecified: Secondary | ICD-10-CM

## 2021-03-28 ENCOUNTER — Other Ambulatory Visit: Payer: Self-pay

## 2021-03-28 ENCOUNTER — Inpatient Hospital Stay: Payer: Medicare PPO | Attending: Oncology

## 2021-03-28 DIAGNOSIS — K219 Gastro-esophageal reflux disease without esophagitis: Secondary | ICD-10-CM | POA: Diagnosis not present

## 2021-03-28 DIAGNOSIS — E7800 Pure hypercholesterolemia, unspecified: Secondary | ICD-10-CM | POA: Insufficient documentation

## 2021-03-28 DIAGNOSIS — N189 Chronic kidney disease, unspecified: Secondary | ICD-10-CM | POA: Diagnosis not present

## 2021-03-28 DIAGNOSIS — I129 Hypertensive chronic kidney disease with stage 1 through stage 4 chronic kidney disease, or unspecified chronic kidney disease: Secondary | ICD-10-CM | POA: Diagnosis not present

## 2021-03-28 DIAGNOSIS — E279 Disorder of adrenal gland, unspecified: Secondary | ICD-10-CM | POA: Diagnosis not present

## 2021-03-28 DIAGNOSIS — Z79899 Other long term (current) drug therapy: Secondary | ICD-10-CM | POA: Insufficient documentation

## 2021-03-28 DIAGNOSIS — D649 Anemia, unspecified: Secondary | ICD-10-CM

## 2021-03-28 DIAGNOSIS — E559 Vitamin D deficiency, unspecified: Secondary | ICD-10-CM | POA: Diagnosis not present

## 2021-03-28 DIAGNOSIS — D631 Anemia in chronic kidney disease: Secondary | ICD-10-CM | POA: Diagnosis not present

## 2021-03-28 DIAGNOSIS — Z8616 Personal history of COVID-19: Secondary | ICD-10-CM | POA: Insufficient documentation

## 2021-03-28 LAB — CBC WITH DIFFERENTIAL/PLATELET
Abs Immature Granulocytes: 0.01 10*3/uL (ref 0.00–0.07)
Basophils Absolute: 0 10*3/uL (ref 0.0–0.1)
Basophils Relative: 0 %
Eosinophils Absolute: 0.1 10*3/uL (ref 0.0–0.5)
Eosinophils Relative: 1 %
HCT: 37.2 % (ref 36.0–46.0)
Hemoglobin: 11.9 g/dL — ABNORMAL LOW (ref 12.0–15.0)
Immature Granulocytes: 0 %
Lymphocytes Relative: 28 %
Lymphs Abs: 1.7 10*3/uL (ref 0.7–4.0)
MCH: 26.9 pg (ref 26.0–34.0)
MCHC: 32 g/dL (ref 30.0–36.0)
MCV: 84.2 fL (ref 80.0–100.0)
Monocytes Absolute: 1 10*3/uL (ref 0.1–1.0)
Monocytes Relative: 16 %
Neutro Abs: 3.2 10*3/uL (ref 1.7–7.7)
Neutrophils Relative %: 55 %
Platelets: 162 10*3/uL (ref 150–400)
RBC: 4.42 MIL/uL (ref 3.87–5.11)
RDW: 14.4 % (ref 11.5–15.5)
WBC: 6 10*3/uL (ref 4.0–10.5)
nRBC: 0 % (ref 0.0–0.2)

## 2021-03-28 LAB — FERRITIN: Ferritin: 225 ng/mL (ref 11–307)

## 2021-03-28 LAB — IRON AND TIBC
Iron: 59 ug/dL (ref 28–170)
Saturation Ratios: 19 % (ref 10.4–31.8)
TIBC: 309 ug/dL (ref 250–450)
UIBC: 250 ug/dL

## 2021-03-29 DIAGNOSIS — H3092 Unspecified chorioretinal inflammation, left eye: Secondary | ICD-10-CM | POA: Diagnosis not present

## 2021-03-29 DIAGNOSIS — H35352 Cystoid macular degeneration, left eye: Secondary | ICD-10-CM | POA: Diagnosis not present

## 2021-03-31 ENCOUNTER — Encounter: Payer: Self-pay | Admitting: Oncology

## 2021-03-31 ENCOUNTER — Inpatient Hospital Stay (HOSPITAL_BASED_OUTPATIENT_CLINIC_OR_DEPARTMENT_OTHER): Payer: Medicare PPO | Admitting: Oncology

## 2021-03-31 VITALS — BP 139/73 | HR 79 | Temp 96.8°F | Resp 18 | Wt 264.1 lb

## 2021-03-31 DIAGNOSIS — E279 Disorder of adrenal gland, unspecified: Secondary | ICD-10-CM | POA: Diagnosis not present

## 2021-03-31 DIAGNOSIS — Z79899 Other long term (current) drug therapy: Secondary | ICD-10-CM | POA: Diagnosis not present

## 2021-03-31 DIAGNOSIS — E7800 Pure hypercholesterolemia, unspecified: Secondary | ICD-10-CM | POA: Diagnosis not present

## 2021-03-31 DIAGNOSIS — E278 Other specified disorders of adrenal gland: Secondary | ICD-10-CM

## 2021-03-31 DIAGNOSIS — D631 Anemia in chronic kidney disease: Secondary | ICD-10-CM | POA: Diagnosis not present

## 2021-03-31 DIAGNOSIS — Z113 Encounter for screening for infections with a predominantly sexual mode of transmission: Secondary | ICD-10-CM | POA: Diagnosis not present

## 2021-03-31 DIAGNOSIS — E559 Vitamin D deficiency, unspecified: Secondary | ICD-10-CM | POA: Diagnosis not present

## 2021-03-31 DIAGNOSIS — H3092 Unspecified chorioretinal inflammation, left eye: Secondary | ICD-10-CM | POA: Diagnosis not present

## 2021-03-31 DIAGNOSIS — I129 Hypertensive chronic kidney disease with stage 1 through stage 4 chronic kidney disease, or unspecified chronic kidney disease: Secondary | ICD-10-CM | POA: Diagnosis not present

## 2021-03-31 DIAGNOSIS — Z8616 Personal history of COVID-19: Secondary | ICD-10-CM | POA: Diagnosis not present

## 2021-03-31 DIAGNOSIS — N189 Chronic kidney disease, unspecified: Secondary | ICD-10-CM | POA: Diagnosis not present

## 2021-03-31 DIAGNOSIS — D649 Anemia, unspecified: Secondary | ICD-10-CM | POA: Diagnosis not present

## 2021-03-31 DIAGNOSIS — K219 Gastro-esophageal reflux disease without esophagitis: Secondary | ICD-10-CM | POA: Diagnosis not present

## 2021-03-31 NOTE — Progress Notes (Signed)
Pt here for follow up. No new concerns voiced.   

## 2021-03-31 NOTE — Progress Notes (Signed)
?Hematology/Oncology follow up note ?[place] ?[phone] [phone] ? ? ?Patient Care Team: ?Burnard Hawthorne, FNP as PCP - General (Family Medicine) ?Minna Merritts, MD as PCP - Cardiology (Cardiology) ?Clent Jacks, RN as Oncology Nurse Navigator ?Earlie Server, MD as Consulting Physician (Hematology and Oncology) ?[place], [place] ? ?REFERRING PROVIDER: ?Burnard Hawthorne, FNP  ?CHIEF COMPLAINTS/REASON FOR VISIT:  ?Follow-up visit for anemia.  ? ?HISTORY OF PRESENTING ILLNESS:  ?Jennifer Huff is a  71 y.o.  female with PMH listed below was seen in consultation at the request of  Burnard Hawthorne, FNP  for evaluation of anemia.  ? ?Patient recently presented to emergency room on 01/01/2019 complaining left sided abdominal/back pain.  Lasted for about 24 hours, pain started in the left back radiating around to the left lower quadrant. ?Prior to that she was diagnosed COVID-19 infection on 12/21/2018. ?Patient had CT renal stone study done on 01/01/2019 ?With normal expansion, heterogeneously density and stranding with adjacent fluid along the left ovary. ?In addition, there is no specific mass of the left measuring up to 2.8 cm in long axis with marginal stranding. ?There is at least 4 abnormal hypodense lesion in the spleen.  Bilateral renal lesions.  Faint subpleural groundglass opacities in both lungs. ? ?01/01/2019 US Pelvis showed a heterogeneously enlarged left ovary for postmenopausal patient.  Ovarian volume 51.7.  Ovarian parenchyma is heterogeneous.  Blood flow without evidence of torsion.  Ovarian neoplasm is considered, though a discrete cystic or solid mass is difficult to delineate from diffuse heterogeneity.  Small volume of pelvic free fluid. ? ?#01/01/2019 MRI abdomen with and without contrast showed no acute findings noted in abdomen to account for patient's symptoms.  2.9 x 1.9 cm left adrenal nodule has suspicious  characteristics.  Patchy ill-defined areas of increased signal intensity in the visualized lung bases which likely reflux.  19 infection.  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   ? ?Patient was released from the emergency room to follow-up with primary care provider and GYN physician. ?Patient has an appointment with GYN on 01/16/2019. ?Patient was referred by primary care provider to cancer center for evaluation of adrenal mass. ? ?Patient was accompanied by her husband.  She denies any additional episodes of left lower quadrant discomfort.  No pain  ?#History of COVID-19 infection ? ?#seen by urology Dr. 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  No intervention was recommended for adrenal adenoma at this point.  She was recommended to repeat MRI in 1 year to ensure stability or return sooner to urology if she develops symptoms. ? 05/06/2019.  Colonoscopy was done which showed multiple polyps.  No malignancy.  Patient was recommended to repeat colonoscopy in 5 years. ? ?INTERVAL HISTORY ?Jennifer Huff is a 71 y.o. female who has above history reviewed by me today presents for follow up visit for anemia.  ?She reports feeling well.  ? ? ?Review of Systems  ?Constitutional:  Negative for appetite change, chills, fatigue and fever.  ?HENT:   Negative for hearing loss and voice change.   ?Eyes:  Negative for eye problems.  ?Respiratory:  Negative for chest tightness and cough.   ?Cardiovascular:  Negative for chest pain.  ?Gastrointestinal:  Negative for abdominal distention, abdominal pain and blood in stool.  ?Endocrine: Negative for hot  flashes.  ?Genitourinary:  Negative for difficulty urinating and frequency.   ?Musculoskeletal:  Negative for arthralgias.  ?Skin:  Negative for itching and rash.  ?Neurological:  Negative for extremity weakness.   ?Hematological:  Negative for adenopathy.  ?Psychiatric/Behavioral:  Negative for confusion.   ? ?MEDICAL HISTORY:  ?Past Medical History:  ?Diagnosis Date  ? Cataract   ? GERD (gastroesophageal reflux disease)   ? HTN (hypertension)   ? Hypercholesteremia   ? Vitamin D deficiency   ? ? ?SURGICAL HISTORY: ?Past Surgical History:  ?Procedure Laterality Date  ? COLONOSCOPY WITH PROPOFOL N/A 05/06/2019  ? Procedure: COLONOSCOPY WITH PROPOFOL;  Surgeon: Virgel Manifold, MD;  Location: [facility] ENDOSCOPY;  Service: Endoscopy;  Laterality: N/A;  ? KNEE ARTHROSCOPY WITH LATERAL MENISECTOMY Right 11/06/2017  ? Procedure: KNEE ARTHROSCOPY WITH REPAIR LATERAL MENISCAL ROOT TEAR;  Surgeon: Corky Mull, MD;  Location: [facility] ORS;  Service: Orthopedics;  Laterality: Right;  ? LAPAROSCOPIC HYSTERECTOMY    ? ? ?SOCIAL HISTORY: ?Social History  ? ?Socioeconomic History  ? Marital status: Married  ?  Spouse name: Not on file  ? Number of children: 3  ? Years of education: Not on file  ? Highest education level: Not on file  ?Occupational History  ? 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 Smoking status: Former  ?  Types: Cigarettes  ?  Quit date: 12/07/1997  ?  Years since quitting: 23.3  ? Smokeless tobacco: Never  ?Vaping Use  ? Vaping Use: Never used  ?Substance and Sexual Activity  ? Alcohol use: Not Currently  ? Drug use: No  ? Sexual activity: Not Currently  ?Other Topics Concern  ? Not on file  ?Social History Narrative  ? Regular Exercise -  NO  ? Daily Caffeine Use:  2-3 soda/sw tea  ?   ? Retired  ? ?Social Determinants of Health  ? ?Financial Resource Strain: Low Risk   ? Difficulty of Paying Living Expenses: Not hard at all  ?Food Insecurity: No Food Insecurity  ? Worried About Charity fundraiser in the Last Year: Never true  ? Ran Out of Food in the Last Year: Never true  ?Transportation Needs: No Transportation Needs  ? Lack of Transportation (Medical): No  ? Lack of Transportation (Non-Medical): No   ?Physical Activity: Not on file  ?Stress: No Stress Concern Present  ? Feeling of Stress : Not at all  ?Social Connections: Unknown  ? Frequency of Communication with Friends and Family: Not on file  ? Frequency of Social Gatherings with Friends and Family: Not on file  ? Attends Religious Services: Not on file  ? Active Member of Clubs or Organizations: Not on file  ? Attends Archivist Meetings: Not on file  ? Marital Status: Married  ?Intimate Partner Violence: Not At Risk  ? Fear of Current or Ex-Partner: No  ? Emotionally Abused: No  ? Physically Abused: No  ? Sexually Abused: No  ? ? ?FAMILY HISTORY: ?Family History  ?Problem Relation Age of Onset  ? Kidney disease Father   ? Heart disease Father   ?     died from mi  ? Cancer Father   ?     prostate  ? Hypertension Sister   ? Colon polyps Sister   ? Hypertension Brother   ? Hypertension Sister   ? Diabetes Mother   ? Hypertension Mother   ? Cancer Paternal Grandmother   ?  head and neck  ? Hypertension Brother   ? Cancer Son   ? Colon cancer Neg Hx   ? Breast cancer Neg Hx   ? Adrenal disorder Neg Hx   ? ? ?ALLERGIES:  is allergic to ace inhibitors and codeine. ? ?MEDICATIONS:  ?Current Outpatient Medications  ?Medication Sig Dispense Refill  ? acetaminophen (TYLENOL) 500 MG tablet Take 1,000 mg by mouth daily as needed for mild pain.    ? amLODipine (NORVASC) 10 MG tablet Take 1 tablet (10 mg total) by mouth daily. 90 tablet 0  ? Cholecalciferol (VITAMIN D3) 2000 units TABS Take 2,000 Units by mouth at bedtime.     ? Difluprednate 0.05 % EMUL Apply 1 drop to eye 4 (four) times daily.    ? docusate sodium (COLACE) 100 MG capsule Take 1 capsule (100 mg total) by mouth daily. 90 capsule 0  ? famotidine (PEPCID) 20 MG tablet Take 1 tablet (20 mg total) by mouth 2 (two) times daily as needed for heartburn or indigestion. 120 tablet 0  ? FEROSUL 325 (65 Fe) MG tablet Take 1 tablet (325 mg total) by mouth 2 (two) times daily with a meal. (Patient  taking differently: Take 325 mg by mouth daily with breakfast.) 180 tablet 3  ? losartan (COZAAR) 50 MG tablet Take 1 tablet (50 mg total) by mouth daily. 90 tablet 3  ? metoprolol tartrate (LOPRESSOR) 25 MG tabl

## 2021-04-01 ENCOUNTER — Encounter: Payer: Self-pay | Admitting: Family

## 2021-04-01 DIAGNOSIS — D649 Anemia, unspecified: Secondary | ICD-10-CM | POA: Insufficient documentation

## 2021-04-04 ENCOUNTER — Other Ambulatory Visit: Payer: Self-pay | Admitting: Ophthalmology

## 2021-04-04 ENCOUNTER — Ambulatory Visit
Admission: RE | Admit: 2021-04-04 | Discharge: 2021-04-04 | Disposition: A | Discharge status: Home / Self Care | Payer: Medicare PPO | Source: Ambulatory Visit | Attending: Ophthalmology | Admitting: Ophthalmology

## 2021-04-04 ENCOUNTER — Ambulatory Visit
Admission: RE | Admit: 2021-04-04 | Discharge: 2021-04-04 | Disposition: A | Discharge status: Home / Self Care | Payer: Medicare PPO | Attending: Ophthalmology | Admitting: Ophthalmology

## 2021-04-04 DIAGNOSIS — H3092 Unspecified chorioretinal inflammation, left eye: Secondary | ICD-10-CM

## 2021-04-04 DIAGNOSIS — Z0389 Encounter for observation for other suspected diseases and conditions ruled out: Secondary | ICD-10-CM | POA: Diagnosis not present

## 2021-04-13 DIAGNOSIS — H35352 Cystoid macular degeneration, left eye: Secondary | ICD-10-CM | POA: Diagnosis not present

## 2021-04-13 DIAGNOSIS — H3092 Unspecified chorioretinal inflammation, left eye: Secondary | ICD-10-CM | POA: Diagnosis not present

## 2021-04-25 ENCOUNTER — Other Ambulatory Visit: Payer: Self-pay | Admitting: Family

## 2021-04-25 DIAGNOSIS — K219 Gastro-esophageal reflux disease without esophagitis: Secondary | ICD-10-CM

## 2021-04-27 DIAGNOSIS — H3092 Unspecified chorioretinal inflammation, left eye: Secondary | ICD-10-CM | POA: Diagnosis not present

## 2021-05-02 ENCOUNTER — Other Ambulatory Visit: Payer: Self-pay | Admitting: Family

## 2021-05-02 DIAGNOSIS — I10 Essential (primary) hypertension: Secondary | ICD-10-CM

## 2021-05-09 DIAGNOSIS — H903 Sensorineural hearing loss, bilateral: Secondary | ICD-10-CM | POA: Diagnosis not present

## 2021-05-25 DIAGNOSIS — H3092 Unspecified chorioretinal inflammation, left eye: Secondary | ICD-10-CM | POA: Diagnosis not present

## 2021-06-03 ENCOUNTER — Other Ambulatory Visit: Payer: Self-pay | Admitting: Family

## 2021-06-03 DIAGNOSIS — E785 Hyperlipidemia, unspecified: Secondary | ICD-10-CM

## 2021-06-03 DIAGNOSIS — I10 Essential (primary) hypertension: Secondary | ICD-10-CM

## 2021-06-08 ENCOUNTER — Ambulatory Visit (INDEPENDENT_AMBULATORY_CARE_PROVIDER_SITE_OTHER): Payer: Medicare PPO | Admitting: Family

## 2021-06-08 ENCOUNTER — Encounter: Payer: Self-pay | Admitting: Family

## 2021-06-08 ENCOUNTER — Telehealth: Payer: Self-pay | Admitting: Family

## 2021-06-08 VITALS — BP 136/82 | HR 87 | Temp 98.5°F | Ht 66.0 in | Wt 261.6 lb

## 2021-06-08 DIAGNOSIS — N189 Chronic kidney disease, unspecified: Secondary | ICD-10-CM | POA: Diagnosis not present

## 2021-06-08 DIAGNOSIS — I10 Essential (primary) hypertension: Secondary | ICD-10-CM

## 2021-06-08 DIAGNOSIS — E118 Type 2 diabetes mellitus with unspecified complications: Secondary | ICD-10-CM

## 2021-06-08 DIAGNOSIS — D631 Anemia in chronic kidney disease: Secondary | ICD-10-CM | POA: Diagnosis not present

## 2021-06-08 LAB — COMPREHENSIVE METABOLIC PANEL
ALT: 10 U/L (ref 0–35)
AST: 14 U/L (ref 0–37)
Albumin: 4 g/dL (ref 3.5–5.2)
Alkaline Phosphatase: 87 U/L (ref 39–117)
BUN: 17 mg/dL (ref 6–23)
CO2: 27 mEq/L (ref 19–32)
Calcium: 10.4 mg/dL (ref 8.4–10.5)
Chloride: 107 mEq/L (ref 96–112)
Creatinine, Ser: 1.31 mg/dL — ABNORMAL HIGH (ref 0.40–1.20)
GFR: 41.51 mL/min — ABNORMAL LOW (ref >60.00)
Glucose, Bld: 121 mg/dL — ABNORMAL HIGH (ref 70–99)
Potassium: 4.1 mEq/L (ref 3.5–5.1)
Sodium: 140 mEq/L (ref 135–145)
Total Bilirubin: 0.5 mg/dL (ref 0.2–1.2)
Total Protein: 7.5 g/dL (ref 6.0–8.3)

## 2021-06-08 LAB — MICROALBUMIN / CREATININE URINE RATIO
Creatinine,U: 109.8 mg/dL
Microalb Creat Ratio: 10.2 mg/g (ref 0.0–30.0)
Microalb, Ur: 11.2 mg/dL — ABNORMAL HIGH (ref 0.0–1.9)

## 2021-06-08 LAB — POCT GLYCOSYLATED HEMOGLOBIN (HGB A1C): Hemoglobin A1C: 6.5 % — AB (ref 4.0–5.6)

## 2021-06-08 NOTE — Telephone Encounter (Signed)
Pt called stating metformin is not at [facility]

## 2021-06-08 NOTE — Assessment & Plan Note (Signed)
Chronic, Stable. Continue amlodipine '10mg'$  ,losartan '50mg'$ , metoprolol tartrate '25mg'$  bid

## 2021-06-08 NOTE — Patient Instructions (Signed)
Lets trial metformin  Start metformin XR with one 500mg tablet at night. After one week, you may increase to two tablets at night ( total of 1000mg) . The third week, you may take take two tablets at night and one tablet in the morning.  The fourth week, you may take two tablets in the morning ( 1000mg total) and two tablets at night (1000mg total). This will bring you to a maximum daily dose of 2000mg/day which is maximum dose. Along the way, if you want to increase more slowly, please do as this medication can cause GI discomfort and loose stools which usually get better with time , however some patients find that they can only tolerate a certain dose and cannot increase to maximum dose.   This is  Dr. Tullo's  example of a  "Low GI"  Diet:  It will allow you to lose 4 to 8  lbs  per month if you follow it carefully.  Your goal with exercise is a minimum of 30 minutes of aerobic exercise 5 days per week (Walking does not count once it becomes easy!)    All of the foods can be found at grocery stores and in bulk at [employer].  The Atkins protein bars and shakes are available in more varieties at Target, WalMart and Lowe's Foods.     7 AM Breakfast:  Choose from the following:  Low carbohydrate Protein  Shakes (I recommend the  Premier Protein chocolate shakes,  EAS AdvantEdge "Carb Control" shakes  Or the Atkins shakes all are under 3 net carbs)     a scrambled egg/bacon/cheese burrito made with Mission's "carb balance" whole wheat tortilla  (about 10 net carbs )  Jimmy Deans sells microwaveable frittata (basically a quiche without the pastry crust) that is eaten cold and very convenient way to get your eggs.  8 carbs)  If you make your own protein shakes, avoid bananas and pineapple,  And use low carb greek yogurt or original /unsweetened almond or soy milk    Avoid cereal and bananas, oatmeal and cream of wheat and grits. They are loaded with carbohydrates!   10 AM: high protein  snack:  Protein bar by Atkins (the snack size, under 200 cal, usually < 6 net carbs).    A stick of cheese:  Around 1 carb,  100 cal     Dannon Light n Fit Greek Yogurt  (80 cal, 8 carbs)  Other so called "protein bars" and Greek yogurts tend to be loaded with carbohydrates.  Remember, in food advertising, the word "energy" is synonymous for " carbohydrate."  Lunch:   A Sandwich using the bread choices listed, Can use any  Eggs,  lunchmeat, grilled meat or canned tuna), avocado, regular mayo/mustard  and cheese.  A Salad using blue cheese, ranch,  Goddess or vinagrette,  Avoid taco shells, croutons or "confetti" and no "candied nuts" but regular nuts OK.   No pretzels, nabs  or chips.  Pickles and miniature sweet peppers are a good low carb alternative that provide a "crunch"  The bread is the only source of carbohydrate in a sandwich and  can be decreased by trying some of the attached alternatives to traditional loaf bread   Avoid "Low fat dressings, as well as [place] and [place] dressings They are loaded with sugar!   3 PM/ Mid day  Snack:  Consider  1 ounce of  almonds, walnuts, pistachios, pecans, peanuts,  Macadamia nuts or a   nut medley.  Avoid "granola and granola bars "  Mixed nuts are ok in moderation as long as there are no raisins,  cranberries or dried fruit.   KIND bars are OK if you get the low glycemic index variety   Try the prosciutto/mozzarella cheese sticks by Fiorruci  In deli /backery section   High protein      6 PM  Dinner:     Meat/fowl/fish with a green salad, and either broccoli, cauliflower, green beans, spinach, brussel sprouts or  Lima beans. DO NOT BREAD THE PROTEIN!!      There is a low carb pasta by Dreamfield's that is acceptable and tastes great: only 5 digestible carbs/serving.( All grocery stores but BJs carry it ) Several ready made meals are available low carb:   Try Michel Angelo's chicken piccata or chicken or eggplant parm over low carb  pasta.(Lowes and BJs)   Aaron Sanchez's "Carnitas" (pulled pork, no sauce,  0 carbs) or his beef pot roast to make a dinner burrito (at BJ's)  Pesto over low carb pasta (bj's sells a good quality pesto in the center refrigerated section of the deli   Try satueeing  Bok Choy with mushroooms as a good side   Green Giant makes a mashed cauliflower that tastes like mashed potatoes  Whole wheat pasta is still full of digestible carbs and  Not as low in glycemic index as Dreamfield's.   Brown rice is still rice,  So skip the rice and noodles if you eat Chinese or Thai (or at least limit to 1/2 cup)  9 PM snack :   Breyer's "low carb" fudgsicle or  ice cream bar (Carb Smart line), or  Weight Watcher's ice cream bar , or another "no sugar added" ice cream;  a serving of fresh berries/cherries with whipped cream   Cheese or DANNON'S LlGHT N FIT GREEK YOGURT  8 ounces of [place] unsweetened almond/cococunut milk    Treat yourself to a parfait made with whipped cream blueberiies, walnuts and vanilla greek yogurt  Avoid bananas, pineapple, grapes  and watermelon on a regular basis because they are high in sugar.  THINK OF THEM AS DESSERT  Remember that snack Substitutions should be less than 10 NET carbs per serving and meals < 20 carbs. Remember to subtract fiber grams to get the "net carbs."   

## 2021-06-08 NOTE — Progress Notes (Signed)
Subjective:    Patient ID: Jennifer Huff, female    DOB: Jun 14, 1950, 71 y.o.   MRN: [id number]  CC: Jennifer Huff is a 71 y.o. female who presents today for follow up.   HPI: Feels well today.  No new complaints Denies blurry vision, peripheral neuropathy.  Family history of diabetes.  She endorses dietary indiscretion.  She drinks tea every day.  HTN- compliant amlodipine '10mg'$  ,losartan '50mg'$ , metoprolol tartrate '25mg'$  bid. No cp.   Consult with 03/31/21 , Dr Tasia Catchings regarding anemia whom advised to have annual cbc, iron with pcp.  She is taking ferrous sulfate '325mg'$  QD  HISTORY:  Past Medical History:  Diagnosis Date   Cataract    GERD (gastroesophageal reflux disease)    HTN (hypertension)    Hypercholesteremia    Vitamin D deficiency    Past Surgical History:  Procedure Laterality Date   COLONOSCOPY WITH PROPOFOL N/A 05/06/2019   Procedure: COLONOSCOPY WITH PROPOFOL;  Surgeon: Virgel Manifold, MD;  Location: [facility] ENDOSCOPY;  Service: Endoscopy;  Laterality: N/A;   KNEE ARTHROSCOPY WITH LATERAL MENISECTOMY Right 11/06/2017   Procedure: KNEE ARTHROSCOPY WITH REPAIR LATERAL MENISCAL ROOT TEAR;  Surgeon: Corky Mull, MD;  Location: [facility] ORS;  Service: Orthopedics;  Laterality: Right;   LAPAROSCOPIC HYSTERECTOMY     Family History  Problem Relation Age of Onset   Diabetes Mother    Hypertension Mother    Kidney disease Father    Heart disease Father        died from mi   Cancer Father        prostate   Hypertension Sister    Colon polyps Sister    Diabetes Sister    Hypertension Sister    Diabetes Sister    Hypertension Brother    Hypertension Brother    Cancer Paternal Grandmother        head and neck   Cancer Son    Colon cancer Neg Hx    Breast cancer Neg Hx    Adrenal disorder Neg Hx     Allergies: Ace inhibitors and Codeine Current Outpatient Medications on File Prior to Visit  Medication Sig Dispense Refill   acetaminophen (TYLENOL) 500 MG  tablet Take 1,000 mg by mouth daily as needed for mild pain.     amLODipine (NORVASC) 10 MG tablet Take 1 tablet (10 mg total) by mouth daily. 90 tablet 0   Cholecalciferol (VITAMIN D3) 2000 units TABS Take 2,000 Units by mouth at bedtime.      Difluprednate 0.05 % EMUL Apply 1 drop to eye 4 (four) times daily.     docusate sodium (COLACE) 100 MG capsule Take 1 capsule (100 mg total) by mouth daily. 90 capsule 0   famotidine (PEPCID) 20 MG tablet Take 1 tablet (20 mg total) by mouth 2 (two) times daily as needed for heartburn or indigestion. 120 tablet 0   FEROSUL 325 (65 Fe) MG tablet Take 1 tablet (325 mg total) by mouth 2 (two) times daily with a meal. (Patient taking differently: Take 325 mg by mouth daily with breakfast.) 180 tablet 3   losartan (COZAAR) 50 MG tablet Take 1 tablet (50 mg total) by mouth daily. 90 tablet 3   metoprolol tartrate (LOPRESSOR) 25 MG tablet Take 1 tablet (25 mg total) by mouth 2 (two) times daily. 120 tablet 0   polyethylene glycol powder (GLYCOLAX/MIRALAX) 17 GM/SCOOP powder Take 1 Container by mouth as needed. Take one capful mix into water as  needed for constipation.     pravastatin (PRAVACHOL) 40 MG tablet Take 1 tablet (40 mg total) by mouth daily. 90 tablet 0   No current facility-administered medications on file prior to visit.    Social History   Tobacco Use   Smoking status: Former    Types: Cigarettes    Quit date: 12/07/1997    Years since quitting: 23.5   Smokeless tobacco: Never  Vaping Use   Vaping Use: Never used  Substance Use Topics   Alcohol use: Not Currently   Drug use: No    Review of Systems  Constitutional:  Negative for chills and fever.  Respiratory:  Negative for cough.   Cardiovascular:  Negative for chest pain and palpitations.  Gastrointestinal:  Negative for nausea and vomiting.     Objective:    BP 136/82 (BP Location: Left Arm, Patient Position: Sitting, Cuff Size: Normal)   Pulse 87   Temp 98.5 F (36.9 C)  (Oral)   Ht '5\' 6"'$  (1.676 m)   Wt 261 lb 9.6 oz (118.7 kg)   SpO2 98%   BMI 42.22 kg/m  BP Readings from Last 3 Encounters:  06/08/21 136/82  03/31/21 139/73  12/08/20 122/78   Wt Readings from Last 3 Encounters:  06/08/21 261 lb 9.6 oz (118.7 kg)  03/31/21 264 lb 1.6 oz (119.8 kg)  12/08/20 263 lb 3.2 oz (119.4 kg)    Physical Exam Vitals reviewed.  Constitutional:      Appearance: She is well-developed.  Eyes:     Conjunctiva/sclera: Conjunctivae normal.  Cardiovascular:     Rate and Rhythm: Normal rate and regular rhythm.     Pulses: Normal pulses.     Heart sounds: Normal heart sounds.  Pulmonary:     Effort: Pulmonary effort is normal.     Breath sounds: Normal breath sounds. No wheezing, rhonchi or rales.  Skin:    General: Skin is warm and dry.  Neurological:     Mental Status: She is alert.  Psychiatric:        Speech: Speech normal.        Behavior: Behavior normal.        Thought Content: Thought content normal.       Assessment & Plan:   Problem List Items Addressed This Visit       Cardiovascular and Mediastinum   Hypertension - Primary    Chronic, Stable. Continue amlodipine '10mg'$  ,losartan '50mg'$ , metoprolol tartrate '25mg'$  bid       Relevant Orders   POCT HgB A1C (Completed)   Comprehensive metabolic panel     Endocrine   Controlled diabetes mellitus type 2 with complications ([place])    Lab Results  Component Value Date   HGBA1C 6.5 (A) 06/08/2021  Start metformin '500mg'$  and titrate. She is currently on losartan '50mg'$  for htn, renal protection.  She declines DM nutrition referral at this time.  Consider jardiance due to CKD in the future.       Relevant Orders   Microalbumin / creatinine urine ratio   Comprehensive metabolic panel     Genitourinary   CKD (chronic kidney disease)     Other   Anemia    Chronic, stable. Reviewed Dr Collie Siad note. Will follow cbc, ferritin annually.Continue ferrous sulfate '325mg'$  QD         I am having  [name]. [name] maintain her Vitamin D3, acetaminophen, losartan, docusate sodium, polyethylene glycol powder, FeroSul, Difluprednate, famotidine, metoprolol tartrate, amLODipine, and pravastatin.   No  orders of the defined types were placed in this encounter.   Return precautions given.   Risks, benefits, and alternatives of the medications and treatment plan prescribed today were discussed, and patient expressed understanding.   Education regarding symptom management and diagnosis given to patient on AVS.  Continue to follow with Burnard Hawthorne, FNP for routine health maintenance.   Loetta Rough and I agreed with plan.   Mable Paris, FNP

## 2021-06-08 NOTE — Assessment & Plan Note (Addendum)
Chronic, stable. Reviewed Dr Collie Siad note. Will follow cbc, ferritin annually.Continue ferrous sulfate '325mg'$  QD

## 2021-06-08 NOTE — Assessment & Plan Note (Addendum)
Lab Results  Component Value Date   HGBA1C 6.5 (A) 06/08/2021   Start metformin '500mg'$  and titrate. She is currently on losartan '50mg'$  for htn, renal protection.  She declines DM nutrition referral at this time.  Consider jardiance due to CKD in the future.

## 2021-06-09 ENCOUNTER — Other Ambulatory Visit: Payer: Self-pay | Admitting: Family

## 2021-06-09 DIAGNOSIS — E118 Type 2 diabetes mellitus with unspecified complications: Secondary | ICD-10-CM

## 2021-06-09 MED ORDER — METFORMIN HCL ER 500 MG PO TB24: 500.0000 mg | ORAL_TABLET | Freq: Every evening | ORAL | 2 refills | Status: DC

## 2021-06-09 NOTE — Progress Notes (Signed)
close

## 2021-06-09 NOTE — Telephone Encounter (Signed)
Call pt Please let her know that I sent metformin

## 2021-06-28 ENCOUNTER — Other Ambulatory Visit: Payer: Self-pay | Admitting: Family

## 2021-06-28 DIAGNOSIS — I10 Essential (primary) hypertension: Secondary | ICD-10-CM

## 2021-06-29 DIAGNOSIS — H35352 Cystoid macular degeneration, left eye: Secondary | ICD-10-CM | POA: Diagnosis not present

## 2021-06-29 DIAGNOSIS — H3092 Unspecified chorioretinal inflammation, left eye: Secondary | ICD-10-CM | POA: Diagnosis not present

## 2021-06-30 ENCOUNTER — Encounter (INDEPENDENT_AMBULATORY_CARE_PROVIDER_SITE_OTHER): Payer: Medicare PPO | Admitting: Ophthalmology

## 2021-07-15 ENCOUNTER — Other Ambulatory Visit: Payer: Self-pay | Admitting: Unknown Physician Specialty

## 2021-07-15 DIAGNOSIS — H903 Sensorineural hearing loss, bilateral: Secondary | ICD-10-CM

## 2021-07-30 ENCOUNTER — Ambulatory Visit
Admission: RE | Admit: 2021-07-30 | Discharge: 2021-07-30 | Disposition: A | Discharge status: Home / Self Care | Payer: Medicare PPO | Source: Ambulatory Visit | Attending: Unknown Physician Specialty | Admitting: Unknown Physician Specialty

## 2021-07-30 DIAGNOSIS — H903 Sensorineural hearing loss, bilateral: Secondary | ICD-10-CM | POA: Diagnosis not present

## 2021-07-30 DIAGNOSIS — I6782 Cerebral ischemia: Secondary | ICD-10-CM | POA: Diagnosis not present

## 2021-07-30 DIAGNOSIS — J3489 Other specified disorders of nose and nasal sinuses: Secondary | ICD-10-CM | POA: Diagnosis not present

## 2021-07-30 MED ORDER — GADOBENATE DIMEGLUMINE 529 MG/ML IV SOLN
20.0000 mL | Freq: Once | INTRAVENOUS | Status: AC | PRN
  Administered 2021-07-30: 20 mL via INTRAVENOUS

## 2021-08-02 ENCOUNTER — Other Ambulatory Visit: Payer: Self-pay | Admitting: Family

## 2021-08-02 DIAGNOSIS — Z1231 Encounter for screening mammogram for malignant neoplasm of breast: Secondary | ICD-10-CM

## 2021-08-04 NOTE — Progress Notes (Signed)
Cardiology Office Note  Date:  08/05/2021   ID:  Masaye, Gatchalian 1950-10-11, MRN [id number]  PCP:  Burnard Hawthorne, FNP   Chief Complaint  Patient presents with   12 month follow up     "Doing well." Medications reviewed by the patient verbally.     HPI:  [name] is a 71 year old woman with past medical history of Former smoker, age 65 to late 25s HTN Depression hyperlipidemia Former smoker quit 1999, 25 years of smoking Who presents for follow-up of her HTN, occasional chest tightness, shortness of breath on exertion, family history of coronary artery disease  Last seen in clinic by myself April 2019 Seen by one of our providers October 2021  Has 13 grandchildren Prior epigastric pain improved with Pepcid  Started going to gym, went 5 days this week Concerned about diagnosis of diabetes Difficulty tolerating metformin secondary to GI issues No chest pain on exertion, mild shortness of breath which she attributes to deconditioning  On amlodipine 10 daily, losartan 50 daily metoprolol tartrate 25 twice daily for hypertension  Lab work reviewed A1c 6.5 Total cholesterol 144 LDL 71 on pravastatin  EKG personally reviewed by myself on todays visit Showing normal sinus rhythm with rate 80 bpm nonspecific ST abnormality  Other past medical history reviewed longstanding HTN, diabetes, HLD, family h/o MI;  Father with CAD, MI 46 to 41s , died 21  PMH:   has a past medical history of Cataract, GERD (gastroesophageal reflux disease), HTN (hypertension), Hypercholesteremia, and Vitamin D deficiency.  PSH:    Past Surgical History:  Procedure Laterality Date   COLONOSCOPY WITH PROPOFOL N/A 05/06/2019   Procedure: COLONOSCOPY WITH PROPOFOL;  Surgeon: Virgel Manifold, MD;  Location: [facility];  Service: Endoscopy;  Laterality: N/A;   KNEE ARTHROSCOPY WITH LATERAL MENISECTOMY Right 11/06/2017   Procedure: KNEE ARTHROSCOPY WITH REPAIR LATERAL  MENISCAL ROOT TEAR;  Surgeon: Corky Mull, MD;  Location: [facility] ORS;  Service: Orthopedics;  Laterality: Right;   LAPAROSCOPIC HYSTERECTOMY      Current Outpatient Medications  Medication Sig Dispense Refill   acetaminophen (TYLENOL) 500 MG tablet Take 1,000 mg by mouth daily as needed for mild pain.     amLODipine (NORVASC) 10 MG tablet Take 1 tablet (10 mg total) by mouth daily. 90 tablet 0   Cholecalciferol (VITAMIN D3) 2000 units TABS Take 2,000 Units by mouth at bedtime.      Difluprednate 0.05 % EMUL Apply 1 drop to eye 4 (four) times daily.     docusate sodium (COLACE) 100 MG capsule Take 1 capsule (100 mg total) by mouth daily. 90 capsule 0   famotidine (PEPCID) 20 MG tablet Take 1 tablet (20 mg total) by mouth 2 (two) times daily as needed for heartburn or indigestion. 120 tablet 0   losartan (COZAAR) 50 MG tablet Take 1 tablet (50 mg total) by mouth daily. 90 tablet 3   metFORMIN (GLUCOPHAGE-XR) 500 MG 24 hr tablet Take 1 tablet (500 mg total) by mouth every evening. 90 tablet 2   metoprolol tartrate (LOPRESSOR) 25 MG tablet Take 1 tablet (25 mg total) by mouth 2 (two) times daily. 120 tablet 0   polyethylene glycol powder (GLYCOLAX/MIRALAX) 17 GM/SCOOP powder Take 1 Container by mouth as needed. Take one capful mix into water as needed for constipation.     pravastatin (PRAVACHOL) 40 MG tablet Take 1 tablet (40 mg total) by mouth daily. 90 tablet 0   FEROSUL 325 (65 Fe) MG  tablet Take 1 tablet (325 mg total) by mouth 2 (two) times daily with a meal. (Patient not taking: Reported on 08/05/2021) 180 tablet 3   No current facility-administered medications for this visit.     Allergies:   Ace inhibitors and Codeine   Social History:  The patient  reports that she quit smoking about 23 years ago. Her smoking use included cigarettes. She has never used smokeless tobacco. She reports that she does not currently use alcohol. She reports that she does not use drugs.   Family History:    family history includes Cancer in her father, paternal grandmother, and son; Colon polyps in her sister; Diabetes in her mother, sister, and sister; Heart disease in her father; Hypertension in her brother, brother, mother, sister, and sister; Kidney disease in her father.    Review of Systems: Review of Systems  Constitutional: Negative.   Respiratory: Negative.    Cardiovascular: Negative.   Gastrointestinal: Negative.   Musculoskeletal: Negative.   Neurological: Negative.   Psychiatric/Behavioral: Negative.    All other systems reviewed and are negative.    PHYSICAL EXAM: VS:  BP 130/70 (BP Location: Left Arm, Patient Position: Sitting, Cuff Size: Large)   Pulse 80   Ht '5\' 6"'$  (1.676 m)   Wt 259 lb 4 oz (117.6 kg)   SpO2 98%   BMI 41.84 kg/m  , BMI Body mass index is 41.84 kg/m. Constitutional:  oriented to person, place, and time. No distress.  Obese HENT:  Head: Grossly normal Eyes:  no discharge. No scleral icterus.  Neck: No JVD, no carotid bruits  Cardiovascular: Regular rate and rhythm, no murmurs appreciated Pulmonary/Chest: Clear to auscultation bilaterally, no wheezes or rails Abdominal: Soft.  no distension.  no tenderness.  Musculoskeletal: Normal range of motion Neurological:  normal muscle tone. Coordination normal. No atrophy Skin: Skin warm and dry Psychiatric: normal affect, pleasant  Recent Labs: 03/28/2021: Hemoglobin 11.9; Platelets 162 06/08/2021: ALT 10; BUN 17; Creatinine, Ser 1.31; Potassium 4.1; Sodium 140    Lipid Panel Lab Results  Component Value Date   CHOL 144 12/08/2020   HDL 56.20 12/08/2020   LDLCALC 71 12/08/2020   TRIG 83.0 12/08/2020      Wt Readings from Last 3 Encounters:  08/05/21 259 lb 4 oz (117.6 kg)  06/08/21 261 lb 9.6 oz (118.7 kg)  03/31/21 264 lb 1.6 oz (119.8 kg)       ASSESSMENT AND PLAN:  Morbid obesity ([place]) Recently started working out at [employer] We have encouraged continued exercise, careful diet  management in an effort to lose weight.  Essential hypertension - Plan: EKG 12-Lead Blood pressure is well controlled on today's visit. No changes made to the medications.  Anxiety and depression Reports feeling well, following exercise program  Former smoker 25 years of smoking  Chest pain with moderate risk for cardiac etiology No recent chest pain symptoms concerning for angina, no further testing at this time  Hyperlipidemia Lab work from 2022 showing cholesterol close to goal  Borderline diabetes Hemoglobin A1c 6.5, Discussed strategies for weight loss including continued exercise   Total encounter time more than 30 minutes  Greater than 50% was spent in counseling and coordination of care with the patient    Orders Placed This Encounter  Procedures   EKG 12-Lead     Signed, Esmond Plants, M.D., Ph.D. 08/05/2021  [facility] [place], [place] [phone]

## 2021-08-05 ENCOUNTER — Ambulatory Visit (INDEPENDENT_AMBULATORY_CARE_PROVIDER_SITE_OTHER): Payer: Medicare PPO | Admitting: Cardiovascular Disease

## 2021-08-05 ENCOUNTER — Encounter: Payer: Self-pay | Admitting: Cardiovascular Disease

## 2021-08-05 VITALS — BP 130/70 | HR 80 | Ht 66.0 in | Wt 259.2 lb

## 2021-08-05 DIAGNOSIS — E782 Mixed hyperlipidemia: Secondary | ICD-10-CM | POA: Diagnosis not present

## 2021-08-05 DIAGNOSIS — H903 Sensorineural hearing loss, bilateral: Secondary | ICD-10-CM | POA: Diagnosis not present

## 2021-08-05 DIAGNOSIS — I10 Essential (primary) hypertension: Secondary | ICD-10-CM

## 2021-08-05 NOTE — Patient Instructions (Signed)
Medication Instructions:  No changes  If you need a refill on your cardiac medications before your next appointment, please call your pharmacy.   Lab work: No new labs needed  Testing/Procedures: No new testing needed  Follow-Up: At [facility], you and your health needs are our priority.  As part of our continuing mission to provide you with exceptional heart care, we have created designated Provider Care Teams.  These Care Teams include your primary Cardiologist (physician) and Advanced Practice Providers (APPs -  Physician Assistants and Nurse Practitioners) who all work together to provide you with the care you need, when you need it.  You will need a follow up appointment as needed  Providers on your designated Care Team:   Christopher Berge, NP Ryan Dunn, PA-C Cadence Furth, PA-C  COVID-19 Vaccine Information can be found at: [email or web address] For questions related to vaccine distribution or appointments, please email [email or web address] or call [phone].    

## 2021-08-11 ENCOUNTER — Other Ambulatory Visit: Payer: Self-pay

## 2021-08-11 DIAGNOSIS — E118 Type 2 diabetes mellitus with unspecified complications: Secondary | ICD-10-CM

## 2021-08-11 MED ORDER — METFORMIN HCL ER 500 MG PO TB24: 500.0000 mg | ORAL_TABLET | Freq: Every evening | ORAL | 2 refills | Status: DC

## 2021-08-22 ENCOUNTER — Other Ambulatory Visit: Payer: Self-pay | Admitting: Family

## 2021-08-22 DIAGNOSIS — I10 Essential (primary) hypertension: Secondary | ICD-10-CM

## 2021-08-24 DIAGNOSIS — H903 Sensorineural hearing loss, bilateral: Secondary | ICD-10-CM | POA: Diagnosis not present

## 2021-08-29 ENCOUNTER — Other Ambulatory Visit: Payer: Self-pay | Admitting: Family

## 2021-08-29 DIAGNOSIS — E785 Hyperlipidemia, unspecified: Secondary | ICD-10-CM

## 2021-08-29 DIAGNOSIS — I10 Essential (primary) hypertension: Secondary | ICD-10-CM

## 2021-09-09 ENCOUNTER — Ambulatory Visit
Admission: RE | Admit: 2021-09-09 | Discharge: 2021-09-09 | Disposition: A | Discharge status: Home / Self Care | Payer: Medicare PPO | Source: Ambulatory Visit | Attending: Family | Admitting: Family

## 2021-09-09 DIAGNOSIS — Z1231 Encounter for screening mammogram for malignant neoplasm of breast: Secondary | ICD-10-CM

## 2021-09-12 ENCOUNTER — Encounter: Payer: Self-pay | Admitting: Family

## 2021-09-12 ENCOUNTER — Ambulatory Visit: Payer: Medicare PPO | Admitting: Family

## 2021-09-12 ENCOUNTER — Ambulatory Visit (INDEPENDENT_AMBULATORY_CARE_PROVIDER_SITE_OTHER): Payer: Medicare PPO | Admitting: Family

## 2021-09-12 VITALS — BP 136/76 | HR 86 | Temp 98.7°F | Ht 66.0 in | Wt 253.6 lb

## 2021-09-12 DIAGNOSIS — I10 Essential (primary) hypertension: Secondary | ICD-10-CM | POA: Diagnosis not present

## 2021-09-12 DIAGNOSIS — E118 Type 2 diabetes mellitus with unspecified complications: Secondary | ICD-10-CM | POA: Diagnosis not present

## 2021-09-12 LAB — MICROALBUMIN / CREATININE URINE RATIO
Creatinine,U: 112.3 mg/dL
Microalb Creat Ratio: 20 mg/g (ref 0.0–30.0)
Microalb, Ur: 22.4 mg/dL — ABNORMAL HIGH (ref 0.0–1.9)

## 2021-09-12 LAB — POCT GLYCOSYLATED HEMOGLOBIN (HGB A1C): Hemoglobin A1C: 5.6 % (ref 4.0–5.6)

## 2021-09-12 MED ORDER — METFORMIN HCL ER 500 MG PO TB24: 500.0000 mg | ORAL_TABLET | Freq: Two times a day (BID) | ORAL | 4 refills | Status: DC

## 2021-09-12 NOTE — Progress Notes (Signed)
Subjective:    Patient ID: Jennifer Huff, female    DOB: 03-Sep-1950, 71 y.o.   MRN: [id number]  CC: Jennifer Huff is a 71 y.o. female who presents today for follow up.   HPI: Feels well today.  No new complaints   DM - compliant with metformin '500mg'$  BID. If she increase she will have GI upset.   HTN-compliant with losartan 50 mg daily, metoprolol tartrate 25 mg twice daily. No Cp.    She is working out regularly.      Proteinuria- Following with Dr Jennifer Huff, follow per patient 12/2021  Last visit with Dr. Tasia Huff 03/31/2021 anemia, due to chronic disease.  Recommended CBC, iron labs annually.  She was discharged from hematology clinic   HISTORY:  Past Medical History:  Diagnosis Date   Cataract    GERD (gastroesophageal reflux disease)    HTN (hypertension)    Hypercholesteremia    Vitamin D deficiency    Past Surgical History:  Procedure Laterality Date   COLONOSCOPY WITH PROPOFOL N/A 05/06/2019   Procedure: COLONOSCOPY WITH PROPOFOL;  Surgeon: Jennifer Manifold, MD;  Location: [facility];  Service: Endoscopy;  Laterality: N/A;   KNEE ARTHROSCOPY WITH LATERAL MENISECTOMY Right 11/06/2017   Procedure: KNEE ARTHROSCOPY WITH REPAIR LATERAL MENISCAL ROOT TEAR;  Surgeon: Jennifer Mull, MD;  Location: [facility];  Service: Orthopedics;  Laterality: Right;   LAPAROSCOPIC HYSTERECTOMY     Family History  Problem Relation Age of Onset   Diabetes Mother    Hypertension Mother    Kidney disease Father    Heart disease Father        died from mi   Cancer Father        prostate   Hypertension Sister    Colon polyps Sister    Diabetes Sister    Hypertension Sister    Diabetes Sister    Hypertension Brother    Hypertension Brother    Cancer Paternal Grandmother        head and neck   Cancer Son    Colon cancer Neg Hx    Breast cancer Neg Hx    Adrenal disorder Neg Hx     Allergies: Ace inhibitors and Codeine Current Outpatient Medications on File Prior  to Visit  Medication Sig Dispense Refill   acetaminophen (TYLENOL) 500 MG tablet Take 1,000 mg by mouth daily as needed for mild pain.     amLODipine (NORVASC) 10 MG tablet Take 1 tablet (10 mg total) by mouth daily. 90 tablet 0   Cholecalciferol (VITAMIN D3) 2000 units TABS Take 2,000 Units by mouth at bedtime.      Difluprednate 0.05 % EMUL Apply 1 drop to eye 4 (four) times daily.     docusate sodium (COLACE) 100 MG capsule Take 1 capsule (100 mg total) by mouth daily. 90 capsule 0   famotidine (PEPCID) 20 MG tablet Take 1 tablet (20 mg total) by mouth 2 (two) times daily as needed for heartburn or indigestion. 120 tablet 0   losartan (COZAAR) 50 MG tablet Take 1 tablet (50 mg total) by mouth daily. 90 tablet 3   metoprolol tartrate (LOPRESSOR) 25 MG tablet Take 1 tablet (25 mg total) by mouth 2 (two) times daily. 60 tablet 0   polyethylene glycol powder (GLYCOLAX/MIRALAX) 17 GM/SCOOP powder Take 1 Container by mouth as needed. Take one capful mix into water as needed for constipation.     pravastatin (PRAVACHOL) 40 MG tablet Take 1 tablet (40 mg  total) by mouth daily. 90 tablet 0   FEROSUL 325 (65 Fe) MG tablet Take 1 tablet (325 mg total) by mouth 2 (two) times daily with a meal. (Patient not taking: Reported on 08/05/2021) 180 tablet 3   No current facility-administered medications on file prior to visit.    Social History   Tobacco Use   Smoking status: Former    Types: Cigarettes    Quit date: 12/07/1997    Years since quitting: 23.7   Smokeless tobacco: Never  Vaping Use   Vaping Use: Never used  Substance Use Topics   Alcohol use: Not Currently   Drug use: No    Review of Systems  Constitutional:  Negative for chills and fever.  Respiratory:  Negative for cough.   Cardiovascular:  Negative for chest pain and palpitations.  Gastrointestinal:  Negative for nausea and vomiting.      Objective:    BP 136/76 (BP Location: Left Arm, Patient Position: Sitting, Cuff Size:  Normal)   Pulse 86   Temp 98.7 F (37.1 C) (Oral)   Ht '5\' 6"'$  (1.676 m)   Wt 253 lb 9.6 oz (115 kg)   SpO2 96%   BMI 40.93 kg/m  BP Readings from Last 3 Encounters:  09/12/21 136/76  08/05/21 130/70  06/08/21 136/82   Wt Readings from Last 3 Encounters:  09/12/21 253 lb 9.6 oz (115 kg)  08/05/21 259 lb 4 oz (117.6 kg)  06/08/21 261 lb 9.6 oz (118.7 kg)    Physical Exam Vitals reviewed.  Constitutional:      Appearance: She is well-developed.  Eyes:     Conjunctiva/sclera: Conjunctivae normal.  Cardiovascular:     Rate and Rhythm: Normal rate and regular rhythm.     Pulses: Normal pulses.     Heart sounds: Normal heart sounds.  Pulmonary:     Effort: Pulmonary effort is normal.     Breath sounds: Normal breath sounds. No wheezing, rhonchi or rales.  Skin:    General: Skin is warm and dry.  Neurological:     Mental Status: She is alert.  Psychiatric:        Speech: Speech normal.        Behavior: Behavior normal.        Thought Content: Thought content normal.        Assessment & Plan:   Problem List Items Addressed This Visit       Cardiovascular and Mediastinum   Hypertension    Chronic, excellent control.  Continue losartan 50 mg daily, metoprolol tartrate 25 mg twice daily        Endocrine   Controlled diabetes mellitus type 2 with complications Jennifer Huff) - Primary    Lab Results  Component Value Date   HGBA1C 5.6 09/12/2021  Excellent control.  Continue metformin 500 mg twice daily.  History of proteinuria and repeating urine protein today.  Discussed with patient inclination to increase losartan from 50 mg to 100 mg.  We will await urine result.  Advised patient to continue follow-up with Dr. Royce Huff whom she does annually.      Relevant Medications   metFORMIN (GLUCOPHAGE-XR) 500 MG 24 hr tablet   Other Relevant Orders   Comprehensive metabolic panel   POCT HgB A1C (Completed)   Microalbumin / creatinine urine ratio     I have changed Jennifer Huff.  Jennifer Huff's metFORMIN. [name] her Vitamin D3, acetaminophen, losartan, docusate sodium, polyethylene glycol powder, FeroSul, Difluprednate, famotidine, metoprolol tartrate,  amLODipine, and pravastatin.   Meds ordered this encounter  Medications   metFORMIN (GLUCOPHAGE-XR) 500 MG 24 hr tablet    Sig: Take 1 tablet (500 mg total) by mouth 2 (two) times daily with a meal.    Dispense:  180 tablet    Refill:  4    Take one tablet 500 mg by mouth in the am and in the evening. 1000 mg total    Order Specific Question:   Supervising Provider    Answer:   Crecencio Mc [[id number]]    Return precautions given.   Risks, benefits, and alternatives of the medications and treatment plan prescribed today were discussed, and patient expressed understanding.   Education regarding symptom management and diagnosis given to patient on AVS.  Continue to follow with Burnard Hawthorne, FNP for routine health maintenance.   Loetta Rough and I agreed with plan.   Mable Paris, FNP

## 2021-09-12 NOTE — Assessment & Plan Note (Signed)
Lab Results  Component Value Date   HGBA1C 5.6 09/12/2021   Excellent control.  Continue metformin 500 mg twice daily.  History of proteinuria and repeating urine protein today.  Discussed with patient inclination to increase losartan from 50 mg to 100 mg.  We will await urine result.  Advised patient to continue follow-up with Dr. Royce Macadamia whom she does annually.

## 2021-09-12 NOTE — Assessment & Plan Note (Signed)
Chronic, excellent control.  Continue losartan 50 mg daily, metoprolol tartrate 25 mg twice daily

## 2021-09-19 ENCOUNTER — Other Ambulatory Visit: Payer: Self-pay | Admitting: Family

## 2021-09-19 DIAGNOSIS — K219 Gastro-esophageal reflux disease without esophagitis: Secondary | ICD-10-CM

## 2021-09-19 NOTE — Telephone Encounter (Signed)
Rx sent patient is aware 

## 2021-09-20 ENCOUNTER — Other Ambulatory Visit: Payer: Self-pay | Admitting: Family

## 2021-09-20 DIAGNOSIS — I10 Essential (primary) hypertension: Secondary | ICD-10-CM

## 2021-09-21 ENCOUNTER — Other Ambulatory Visit: Payer: Self-pay

## 2021-09-21 ENCOUNTER — Telehealth: Payer: Self-pay

## 2021-09-21 DIAGNOSIS — I10 Essential (primary) hypertension: Secondary | ICD-10-CM

## 2021-09-21 MED ORDER — LOSARTAN POTASSIUM 100 MG PO TABS: 100.0000 mg | ORAL_TABLET | Freq: Every day | ORAL | 3 refills | Status: DC

## 2021-09-21 NOTE — Telephone Encounter (Signed)
Patient states she just picked up her medication from [place] in [place] and she thought her  losartan (COZAAR) 50 MG tablet was going to be changed to 100 MG, but instead they didn't give her  losartan (COZAAR) 50 MG tablet at all.  Patient states they did refill her  metoprolol tartrate (LOPRESSOR) 25 MG tablet.  Patient would like to verify that this is correct, please call.

## 2021-09-21 NOTE — Progress Notes (Signed)
Orders only

## 2021-09-21 NOTE — Progress Notes (Signed)
New rx

## 2021-09-25 ENCOUNTER — Emergency Department
Admission: EM | Admit: 2021-09-25 | Discharge: 2021-09-25 | Disposition: A | Discharge status: Home / Self Care | Payer: Medicare PPO | Attending: Emergency Medicine | Admitting: Emergency Medicine

## 2021-09-25 ENCOUNTER — Other Ambulatory Visit: Payer: Self-pay

## 2021-09-25 DIAGNOSIS — T162XXA Foreign body in left ear, initial encounter: Secondary | ICD-10-CM

## 2021-09-25 DIAGNOSIS — W458XXA Other foreign body or object entering through skin, initial encounter: Secondary | ICD-10-CM | POA: Insufficient documentation

## 2021-09-25 NOTE — ED Triage Notes (Signed)
Pt with piece of hearing aid stuck in left ear canal. Pt appears in no acute distress.

## 2021-09-25 NOTE — ED Provider Notes (Signed)
[facility] EMERGENCY DEPARTMENT Provider Note   CSN: [id number] Arrival date & time: 09/25/21  2119     History  Chief Complaint  Patient presents with   Foreign Body    Jennifer Huff is a 71 y.o. female.  Presents to the emergency department for evaluation of left ear foreign body.  Patient was using her hearing aids when the tip of the hearing aid came off inside her ear canal.  She describes a rubber tip of the ear and of her hearing aid.  She denies any pain, trauma, bleeding.  HPI     Home Medications Prior to Admission medications   Medication Sig Start Date End Date Taking? Authorizing Provider  acetaminophen (TYLENOL) 500 MG tablet Take 1,000 mg by mouth daily as needed for mild pain.    [provider]  amLODipine (NORVASC) 10 MG tablet Take 1 tablet (10 mg total) by mouth daily. 08/29/21   Burnard Hawthorne, FNP  Cholecalciferol (VITAMIN D3) 2000 units TABS Take 2,000 Units by mouth at bedtime.     [provider]  Difluprednate 0.05 % EMUL Apply 1 drop to eye 4 (four) times daily.    [provider]  docusate sodium (COLACE) 100 MG capsule Take 1 capsule (100 mg total) by mouth daily. 09/18/19   Earlie Server, MD  famotidine (PEPCID) 20 MG tablet Take 1 tablet (20 mg total) by mouth 2 (two) times daily as needed for heartburn or indigestion. 09/19/21   Burnard Hawthorne, FNP  FEROSUL 325 (65 Fe) MG tablet Take 1 tablet (325 mg total) by mouth 2 (two) times daily with a meal. Patient not taking: Reported on 08/05/2021 04/05/20   Earlie Server, MD  losartan (COZAAR) 100 MG tablet Take 1 tablet (100 mg total) by mouth daily. 09/21/21   Burnard Hawthorne, FNP  metFORMIN (GLUCOPHAGE-XR) 500 MG 24 hr tablet Take 1 tablet (500 mg total) by mouth 2 (two) times daily with a meal. 09/12/21   Arnett, Yvetta Coder, FNP  metoprolol tartrate (LOPRESSOR) 25 MG tablet Take 1 tablet (25 mg total) by mouth 2 (two) times daily. 09/20/21   Burnard Hawthorne, FNP  polyethylene glycol powder (GLYCOLAX/MIRALAX) 17 GM/SCOOP powder Take 1 Container by mouth as needed. Take one capful mix into water as needed for constipation.    [provider]  pravastatin (PRAVACHOL) 40 MG tablet Take 1 tablet (40 mg total) by mouth daily. 08/29/21   Burnard Hawthorne, FNP      Allergies    Ace inhibitors and Codeine    Review of Systems   Review of Systems  Physical Exam Updated Vital Signs BP (!) 174/97   Pulse 97   Temp 97.8 F (36.6 C) (Oral)   Resp 18   SpO2 96%  Physical Exam Constitutional:      Appearance: She is well-developed.  HENT:     Head: Normocephalic and atraumatic.     Ears:     Comments: Left ear canal with visualized foreign body, small piece of rubber, this was successfully removed with alligator forceps with no complication.  Patient tolerated procedure well.  TM inspected after foreign body removal and completely visualized with no signs of trauma, TM is intact with no bleeding Eyes:     Conjunctiva/sclera: Conjunctivae normal.  Cardiovascular:     Rate and Rhythm: Normal rate.  Pulmonary:     Effort: Pulmonary effort is normal. No respiratory distress.  Musculoskeletal:  General: Normal range of motion.     Cervical back: Normal range of motion.  Skin:    General: Skin is warm.     Findings: No rash.  Neurological:     Mental Status: She is alert and oriented to person, place, and time.  Psychiatric:        Behavior: Behavior normal.        Thought Content: Thought content normal.     ED Results / Procedures / Treatments   Labs (all labs ordered are listed, but only abnormal results are displayed) Labs Reviewed - No data to display  EKG None  Radiology No results found.  Procedures Procedures    Medications Ordered in ED Medications - No data to display  ED Course/ Medical Decision Making/ A&P                           Medical Decision Making  71 year old female with  successful foreign body removal to the left TM.  TM is intact.  No injury to the TM or canal from foreign body or foreign body removal. Final Clinical Impression(s) / ED Diagnoses Final diagnoses:  Foreign body of left ear, initial encounter    Rx / DC Orders ED Discharge Orders     None         Renata Caprice 09/25/21 2137    Nance Pear, MD 09/25/21 2211

## 2021-09-29 NOTE — Telephone Encounter (Addendum)
Lvm to call back and also sent message via my chart

## 2021-10-05 ENCOUNTER — Ambulatory Visit (INDEPENDENT_AMBULATORY_CARE_PROVIDER_SITE_OTHER): Payer: Medicare PPO | Admitting: Family

## 2021-10-05 ENCOUNTER — Encounter: Payer: Self-pay | Admitting: Family

## 2021-10-05 DIAGNOSIS — I10 Essential (primary) hypertension: Secondary | ICD-10-CM

## 2021-10-05 DIAGNOSIS — H35352 Cystoid macular degeneration, left eye: Secondary | ICD-10-CM | POA: Diagnosis not present

## 2021-10-05 DIAGNOSIS — E119 Type 2 diabetes mellitus without complications: Secondary | ICD-10-CM | POA: Diagnosis not present

## 2021-10-05 DIAGNOSIS — E785 Hyperlipidemia, unspecified: Secondary | ICD-10-CM | POA: Diagnosis not present

## 2021-10-05 DIAGNOSIS — K219 Gastro-esophageal reflux disease without esophagitis: Secondary | ICD-10-CM

## 2021-10-05 DIAGNOSIS — H11422 Conjunctival edema, left eye: Secondary | ICD-10-CM | POA: Diagnosis not present

## 2021-10-05 DIAGNOSIS — H3092 Unspecified chorioretinal inflammation, left eye: Secondary | ICD-10-CM | POA: Diagnosis not present

## 2021-10-05 MED ORDER — AMLODIPINE BESYLATE 10 MG PO TABS: 10.0000 mg | ORAL_TABLET | Freq: Every day | ORAL | 3 refills | Status: DC

## 2021-10-05 MED ORDER — FAMOTIDINE 20 MG PO TABS: 20.0000 mg | ORAL_TABLET | Freq: Two times a day (BID) | ORAL | 3 refills | Status: DC | PRN

## 2021-10-05 MED ORDER — METOPROLOL TARTRATE 25 MG PO TABS: 25.0000 mg | ORAL_TABLET | Freq: Two times a day (BID) | ORAL | 3 refills | Status: DC

## 2021-10-05 MED ORDER — PRAVASTATIN SODIUM 40 MG PO TABS: 40.0000 mg | ORAL_TABLET | Freq: Every day | ORAL | 3 refills | Status: DC

## 2021-10-05 NOTE — Assessment & Plan Note (Signed)
Chronic, stable.  Continue metoprolol 25 mg twice daily with the losartan '100mg'$  once daily, amlodipine 10 mg

## 2021-10-05 NOTE — Progress Notes (Signed)
Subjective:    Patient ID: Jennifer Huff, female    DOB: 1950-01-17, 71 y.o.   MRN: [id number]  CC: Jennifer Huff is a 71 y.o. female who presents today for follow up.   HPI: Feels well today.  No new complaints.   HTN-compliant with metoprolol 25 mg twice daily with the losartan '100mg'$  once daily, amlodipine 10 mg  Increased losartan from 50 mg  to 100 mg in the setting of microalbuminemia  She request refills today HISTORY:  Past Medical History:  Diagnosis Date   Cataract    GERD (gastroesophageal reflux disease)    HTN (hypertension)    Hypercholesteremia    Vitamin D deficiency    Past Surgical History:  Procedure Laterality Date   COLONOSCOPY WITH PROPOFOL N/A 05/06/2019   Procedure: COLONOSCOPY WITH PROPOFOL;  Surgeon: Virgel Manifold, MD;  Location: [facility] ENDOSCOPY;  Service: Endoscopy;  Laterality: N/A;   KNEE ARTHROSCOPY WITH LATERAL MENISECTOMY Right 11/06/2017   Procedure: KNEE ARTHROSCOPY WITH REPAIR LATERAL MENISCAL ROOT TEAR;  Surgeon: Corky Mull, MD;  Location: [facility] ORS;  Service: Orthopedics;  Laterality: Right;   LAPAROSCOPIC HYSTERECTOMY     Family History  Problem Relation Age of Onset   Diabetes Mother    Hypertension Mother    Kidney disease Father    Heart disease Father        died from mi   Cancer Father        prostate   Hypertension Sister    Colon polyps Sister    Diabetes Sister    Hypertension Sister    Diabetes Sister    Hypertension Brother    Hypertension Brother    Cancer Paternal Grandmother        head and neck   Cancer Son    Colon cancer Neg Hx    Breast cancer Neg Hx    Adrenal disorder Neg Hx     Allergies: Ace inhibitors and Codeine Current Outpatient Medications on File Prior to Visit  Medication Sig Dispense Refill   acetaminophen (TYLENOL) 500 MG tablet Take 1,000 mg by mouth daily as needed for mild pain.     Cholecalciferol (VITAMIN D3) 2000 units TABS Take 2,000 Units by mouth at bedtime.       Difluprednate 0.05 % EMUL Apply 1 drop to eye 4 (four) times daily.     docusate sodium (COLACE) 100 MG capsule Take 1 capsule (100 mg total) by mouth daily. 90 capsule 0   losartan (COZAAR) 100 MG tablet Take 1 tablet (100 mg total) by mouth daily. 90 tablet 3   metFORMIN (GLUCOPHAGE-XR) 500 MG 24 hr tablet Take 1 tablet (500 mg total) by mouth 2 (two) times daily with a meal. 180 tablet 4   polyethylene glycol powder (GLYCOLAX/MIRALAX) 17 GM/SCOOP powder Take 1 Container by mouth as needed. Take one capful mix into water as needed for constipation.     FEROSUL 325 (65 Fe) MG tablet Take 1 tablet (325 mg total) by mouth 2 (two) times daily with a meal. (Patient not taking: Reported on 08/05/2021) 180 tablet 3   No current facility-administered medications on file prior to visit.    Social History   Tobacco Use   Smoking status: Former    Types: Cigarettes    Quit date: 12/07/1997    Years since quitting: 23.8   Smokeless tobacco: Never  Vaping Use   Vaping Use: Never used  Substance Use Topics   Alcohol use: Not Currently  Drug use: No    Review of Systems  Constitutional:  Negative for chills and fever.  Respiratory:  Negative for cough.   Cardiovascular:  Negative for chest pain and palpitations.  Gastrointestinal:  Negative for nausea and vomiting.      Objective:    BP 128/82 (BP Location: Left Arm, Patient Position: Sitting, Cuff Size: Normal)   Pulse 77   Temp 97.9 F (36.6 C) (Oral)   Ht '5\' 6"'$  (1.676 m)   Wt 254 lb 1.6 oz (115.3 kg)   SpO2 97%   BMI 41.01 kg/m  BP Readings from Last 3 Encounters:  10/05/21 128/82  09/25/21 (!) 174/97  09/12/21 136/76   Wt Readings from Last 3 Encounters:  10/05/21 254 lb 1.6 oz (115.3 kg)  09/12/21 253 lb 9.6 oz (115 kg)  08/05/21 259 lb 4 oz (117.6 kg)    Physical Exam Vitals reviewed.  Constitutional:      Appearance: She is well-developed.  Eyes:     Conjunctiva/sclera: Conjunctivae normal.  Cardiovascular:      Rate and Rhythm: Normal rate and regular rhythm.     Pulses: Normal pulses.     Heart sounds: Normal heart sounds.  Pulmonary:     Effort: Pulmonary effort is normal.     Breath sounds: Normal breath sounds. No wheezing, rhonchi or rales.  Skin:    General: Skin is warm and dry.  Neurological:     Mental Status: She is alert.  Psychiatric:        Speech: Speech normal.        Behavior: Behavior normal.        Thought Content: Thought content normal.        Assessment & Plan:   Problem List Items Addressed This Visit       Cardiovascular and Mediastinum   Hypertension    Chronic, stable.  Continue metoprolol 25 mg twice daily with the losartan '100mg'$  once daily, amlodipine 10 mg      Relevant Medications   pravastatin (PRAVACHOL) 40 MG tablet   metoprolol tartrate (LOPRESSOR) 25 MG tablet   amLODipine (NORVASC) 10 MG tablet     Digestive   GERD (gastroesophageal reflux disease)   Relevant Medications   famotidine (PEPCID) 20 MG tablet   Other Visit Diagnoses     Hyperlipidemia, unspecified hyperlipidemia type       Relevant Medications   pravastatin (PRAVACHOL) 40 MG tablet   metoprolol tartrate (LOPRESSOR) 25 MG tablet   amLODipine (NORVASC) 10 MG tablet   Essential hypertension       Relevant Medications   pravastatin (PRAVACHOL) 40 MG tablet   metoprolol tartrate (LOPRESSOR) 25 MG tablet   amLODipine (NORVASC) 10 MG tablet        I am having Jennifer Huff. Jennifer Huff maintain her Vitamin D3, acetaminophen, docusate sodium, polyethylene glycol powder, FeroSul, Difluprednate, metFORMIN, losartan, famotidine, pravastatin, metoprolol tartrate, and amLODipine.   Meds ordered this encounter  Medications   famotidine (PEPCID) 20 MG tablet    Sig: Take 1 tablet (20 mg total) by mouth 2 (two) times daily as needed for heartburn or indigestion.    Dispense:  180 tablet    Refill:  3    Order Specific Question:   Supervising Provider    Answer:   Jennifer Huff  [[id number]]   pravastatin (PRAVACHOL) 40 MG tablet    Sig: Take 1 tablet (40 mg total) by mouth daily.    Dispense:  90 tablet  Refill:  3    Order Specific Question:   Supervising Provider    Answer:   Jennifer Huff [[id number]]   metoprolol tartrate (LOPRESSOR) 25 MG tablet    Sig: Take 1 tablet (25 mg total) by mouth 2 (two) times daily.    Dispense:  180 tablet    Refill:  3    Order Specific Question:   Supervising Provider    Answer:   Jennifer Huff [[id number]]   amLODipine (NORVASC) 10 MG tablet    Sig: Take 1 tablet (10 mg total) by mouth daily.    Dispense:  90 tablet    Refill:  3    Order Specific Question:   Supervising Provider    Answer:   Crecencio Mc [[id number]]    Return precautions given.   Risks, benefits, and alternatives of the medications and treatment plan prescribed today were discussed, and patient expressed understanding.   Education regarding symptom management and diagnosis given to patient on AVS.  Continue to follow with Burnard Hawthorne, FNP for routine health maintenance.   Loetta Rough and I agreed with plan.   Mable Paris, FNP

## 2021-10-06 LAB — BASIC METABOLIC PANEL
BUN: 17 mg/dL (ref 6–23)
CO2: 25 mEq/L (ref 19–32)
Calcium: 10 mg/dL (ref 8.4–10.5)
Chloride: 107 mEq/L (ref 96–112)
Creatinine, Ser: 1.42 mg/dL — ABNORMAL HIGH (ref 0.40–1.20)
GFR: 37.59 mL/min — ABNORMAL LOW (ref >60.00)
Glucose, Bld: 120 mg/dL — ABNORMAL HIGH (ref 70–99)
Potassium: 3.8 mEq/L (ref 3.5–5.1)
Sodium: 141 mEq/L (ref 135–145)

## 2021-10-10 IMAGING — MR MR PELVIS WO/W CM
9 of 15 series · 25 of 48 positions shown · IV contrast (10ml Gadavist)
Comparison: 02/10/2019 pelvic sonogram. 01/01/2019 unenhanced CT
abdomen/pelvis.

CLINICAL DATA: Indeterminate left adnexal mass on prior CT and
ultrasound. No current symptoms reported. Prior hysterectomy.

EXAM:
MRI PELVIS WITHOUT AND WITH CONTRAST
TECHNIQUE: Multiplanar multisequence MR imaging of the pelvis was performed
both before and after administration of intravenous contrast.
CONTRAST:  10mL GADAVIST GADOBUTROL 1 MMOL/ML IV SOLN

[Series 2: T2 · coronal · 5.0mm · 1.41mm/px · 1 of 33 slices shown (1 of 2)]
[im 1/33]
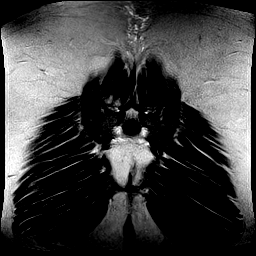

[Series 3: T2 · axial · 5.0mm · 0.75mm/px · 1 of 37 slices shown (2 of 2)]
[im 1/37]
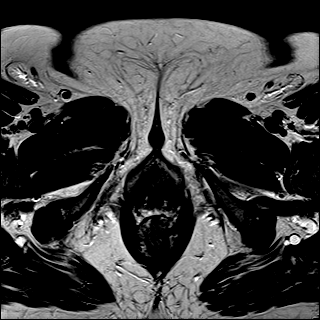

[Series 4: T2 fat-sat · axial · 5.0mm · 0.90mm/px · z∈[-105,+111]mm · 2 of 37 slices shown]
[im 1/37]
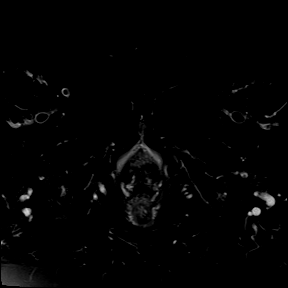
[im 37/37]
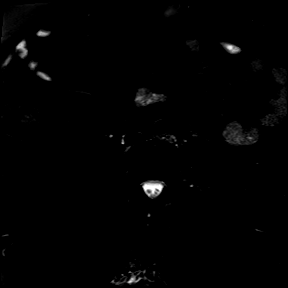

[Series 5: sag tse · sagittal · 5.0mm · 0.75mm/px · 2 of 33 slices shown]
[im 1/33]
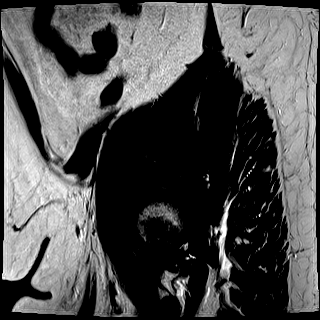
[im 33/33]
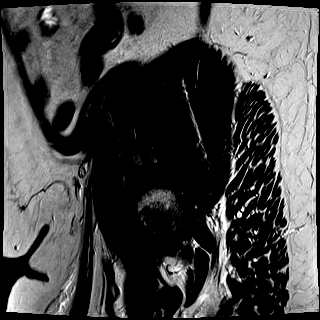

[Series 6: T1 dynamic fat-sat · axial · 3.0mm · 0.47mm/px · z∈[-115,+122]mm · 4 of 80 slices shown (1 of 4)]
[im 1/80]
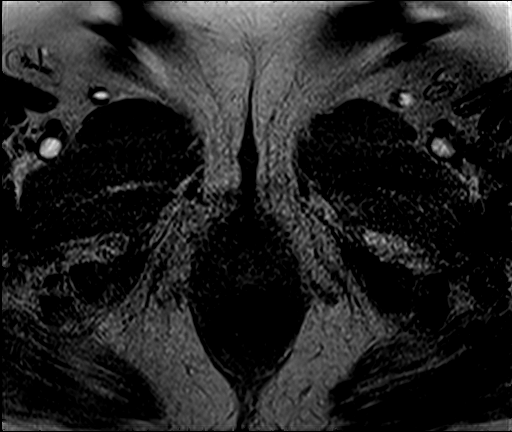
[im 27/80]
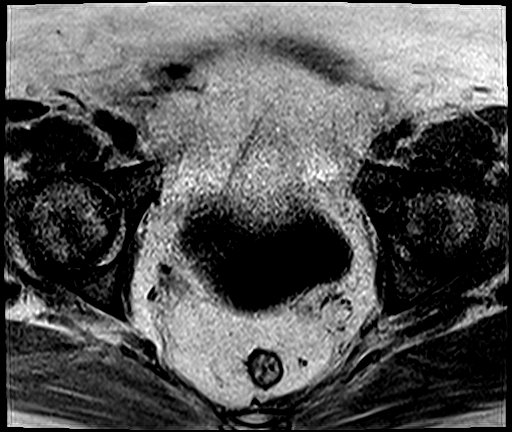
[im 53/80]
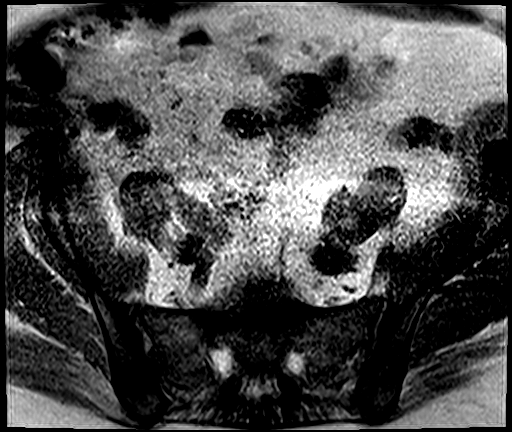
[im 80/80]
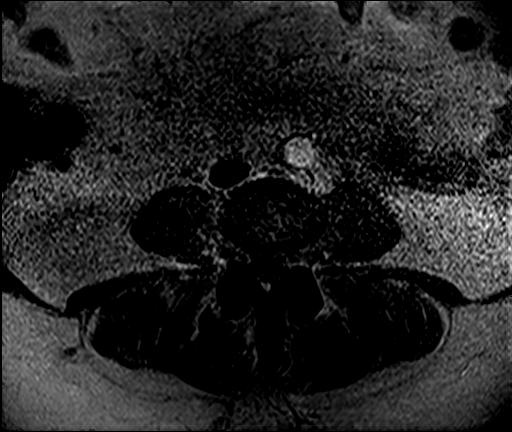

[Series 6: T1 dynamic fat-sat · axial · 3.0mm · 0.47mm/px · z∈[-115,+122]mm · 4 of 80 slices shown (2 of 4)]
[im 1/80]
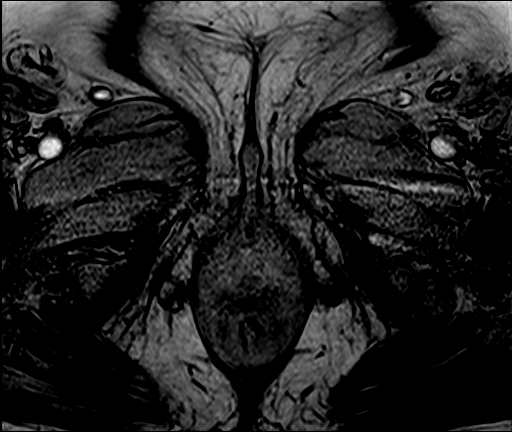
[im 27/80]
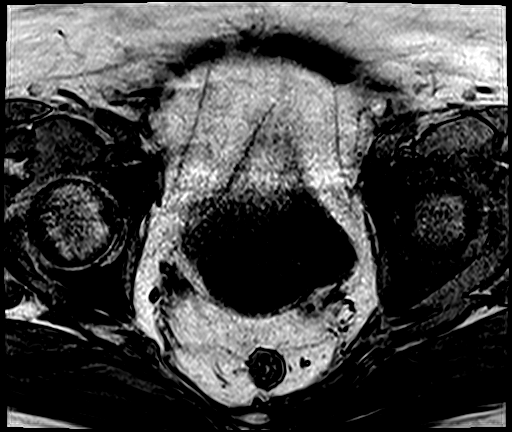
[im 53/80]
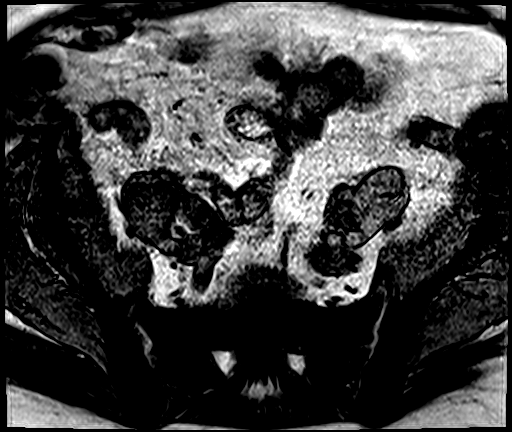
[im 80/80]
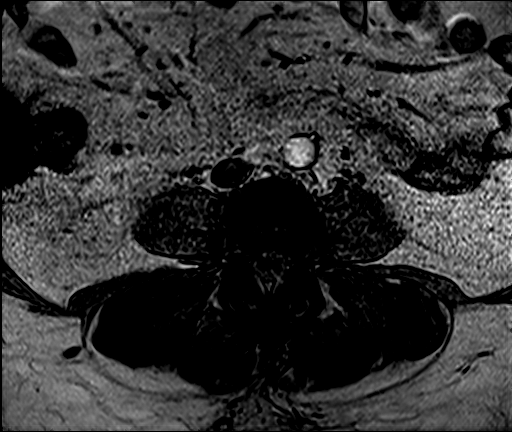

[Series 7: T1 dynamic fat-sat · axial · 3.0mm · 0.47mm/px · z∈[-115,+122]mm · 4 of 80 slices shown (3 of 4)]
[im 1/80]
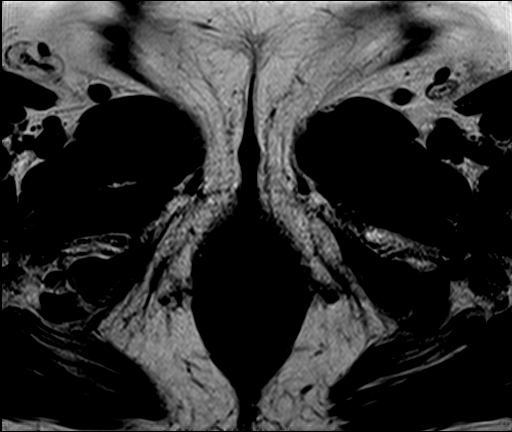
[im 27/80]
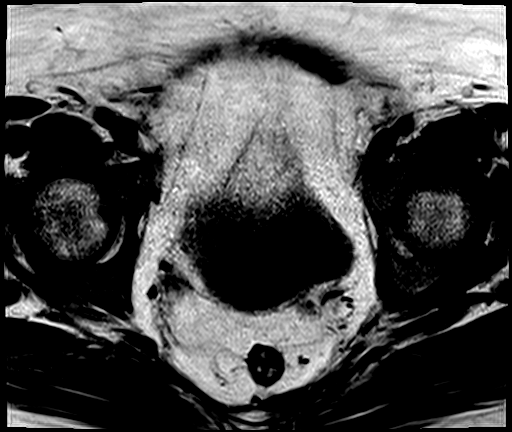
[im 53/80]
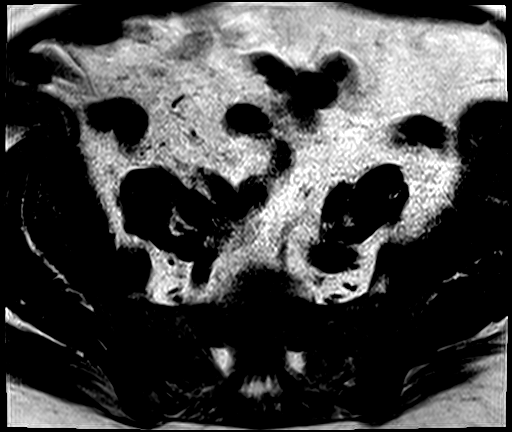
[im 80/80]
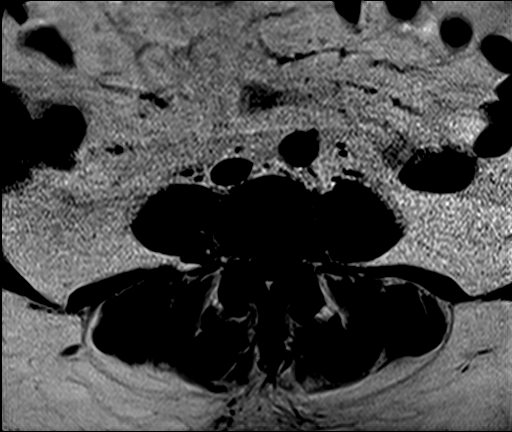

[Series 8: T1 dynamic fat-sat · axial · 3.0mm · 0.47mm/px · z∈[-115,+122]mm · 4 of 80 slices shown (4 of 4)]
[im 1/80]
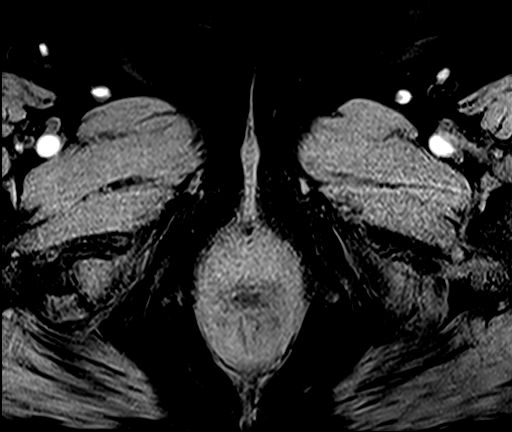
[im 27/80]
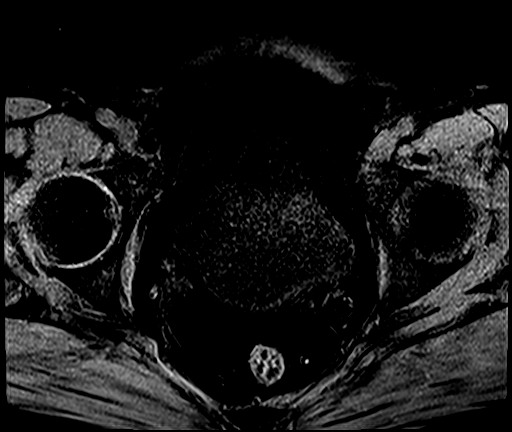
[im 53/80]
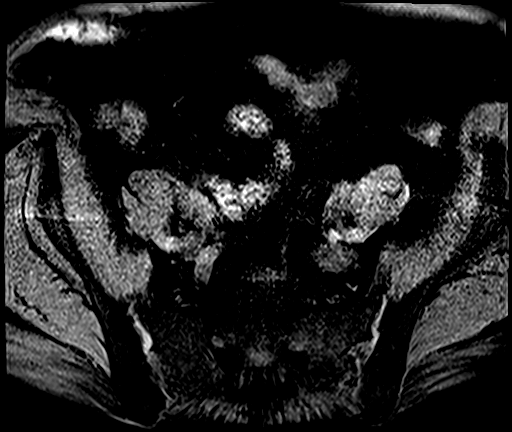
[im 80/80]
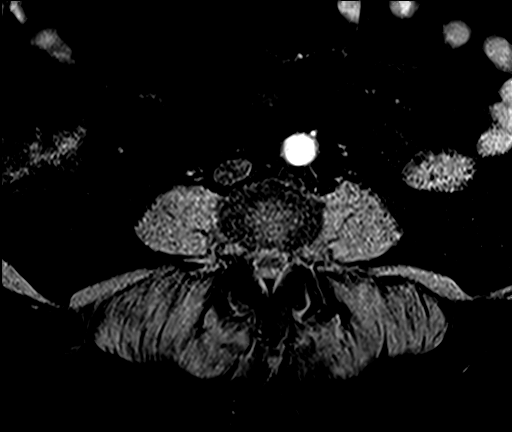

[Series 9: T1 dynamic · axial · 3.0mm · 0.47mm/px · z∈[-115,+41]mm · 3 of 80 slices shown]
[im 1/80]
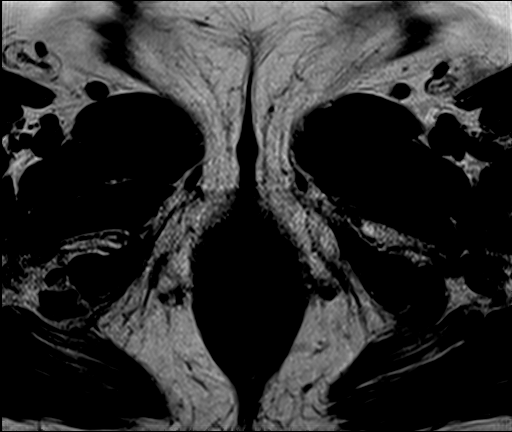
[im 27/80]
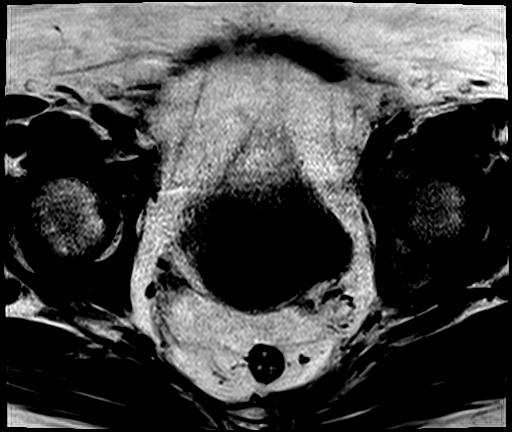
[im 53/80]
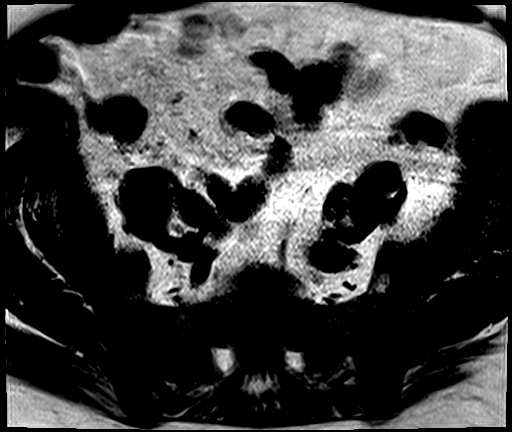

[25 of 48 positions shown; findings below may reference images not displayed]

FINDINGS: Urinary Tract:  Normal bladder.  Normal urethra.

Bowel: Visualized small and large bowel are normal caliber with no
bowel wall thickening. Mild sigmoid diverticulosis.

Vascular/Lymphatic: No pathologically enlarged lymph nodes in the
pelvis. No acute vascular abnormality in the pelvis.

Reproductive:

Status post hysterectomy. No mass or fluid collection at the vaginal
cuff.

Ovaries and Adnexa: The right ovary measures 2.1 x 1.4 x 1.6 cm and
is normal. The left ovary measures 1.9 x 1.2 x 1.4 cm and is normal.
There are no ovarian or adnexal masses. The asymmetric left ovarian
enlargement seen on 01/01/2019 unenhanced CT abdomen/pelvis study
has resolved.

Other: No free fluid in the pelvis. No focal pelvic fluid
collection. Incidentally noted small simple appearing renal cysts in
the lower kidneys.

Musculoskeletal: No aggressive appearing focal osseous lesions.
IMPRESSION: 1. Symmetric normal postmenopausal ovaries. No ovarian or adnexal
masses. The asymmetric left ovarian enlargement seen on 01/01/2019
unenhanced CT abdomen/pelvis study has resolved.
2. Status post hysterectomy, with no abnormality seen at the vaginal
cuff.
3. Mild sigmoid diverticulosis.

## 2021-10-11 ENCOUNTER — Other Ambulatory Visit: Payer: Self-pay | Admitting: Family

## 2021-10-11 DIAGNOSIS — N189 Chronic kidney disease, unspecified: Secondary | ICD-10-CM

## 2021-10-12 ENCOUNTER — Telehealth: Payer: Self-pay | Admitting: Family

## 2021-10-12 NOTE — Telephone Encounter (Signed)
Spoke with patient spouse he stated he get her to call back to resched 10/13 appt.   If patient call please reschedule her Friday,10/14/21 AWV and flu shot.  Jennifer Huff is out of office that day.  Thanks Jennifer Huff

## 2021-10-14 ENCOUNTER — Telehealth: Payer: Self-pay | Admitting: Family

## 2021-10-14 ENCOUNTER — Ambulatory Visit (INDEPENDENT_AMBULATORY_CARE_PROVIDER_SITE_OTHER): Payer: Medicare PPO

## 2021-10-14 ENCOUNTER — Ambulatory Visit: Payer: Medicare PPO

## 2021-10-14 DIAGNOSIS — Z23 Encounter for immunization: Secondary | ICD-10-CM | POA: Diagnosis not present

## 2021-10-14 NOTE — Progress Notes (Signed)
Patient arrived for high dose Flu vaccine. Given high dose flu vaccine in right arm. Patient tolerated vaccine well. Patient did not show any signs of distress or voice any concerns.

## 2021-10-14 NOTE — Telephone Encounter (Signed)
Pt called stating she received a call from the office

## 2021-10-14 NOTE — Telephone Encounter (Signed)
Pt was called to reschedule her appt  to 10/14/2021

## 2021-10-17 ENCOUNTER — Other Ambulatory Visit: Payer: Self-pay | Admitting: Family

## 2021-10-17 DIAGNOSIS — I10 Essential (primary) hypertension: Secondary | ICD-10-CM

## 2021-10-19 ENCOUNTER — Ambulatory Visit (INDEPENDENT_AMBULATORY_CARE_PROVIDER_SITE_OTHER): Payer: Medicare PPO

## 2021-10-19 VITALS — Ht 66.0 in | Wt 254.0 lb

## 2021-10-19 DIAGNOSIS — Z0000 Encounter for general adult medical examination without abnormal findings: Secondary | ICD-10-CM

## 2021-10-19 NOTE — Progress Notes (Signed)
Subjective:   Jennifer Huff is a 71 y.o. female who presents for Medicare Annual (Subsequent) preventive examination.  Review of Systems    No ROS.  Medicare Wellness Virtual Visit.  Visual/audio telehealth visit, UTA vital signs.   See social history for additional risk factors.   Cardiac Risk Factors include: advanced age (>62mn, >>86women)     Objective:    Today's Vitals   10/19/21 0903  Weight: 254 lb (115.2 kg)  Height: '5\' 6"'$  (1.676 m)   Body mass index is 41 kg/m.     10/19/2021    9:05 AM 03/31/2021    9:52 AM 10/14/2020    9:12 AM 10/14/2019   12:13 PM 09/25/2019   10:33 AM 06/23/2019    8:52 AM 05/06/2019    9:38 AM  Advanced Directives  Does Patient Have a Medical Advance Directive? No No No No No No No  Would patient like information on creating a medical advance directive? No - Patient declined  No - Patient declined No - Patient declined  Yes (MAU/Ambulatory/Procedural Areas - Information given)     Current Medications (verified) Outpatient Encounter Medications as of 10/19/2021  Medication Sig   acetaminophen (TYLENOL) 500 MG tablet Take 1,000 mg by mouth daily as needed for mild pain.   amLODipine (NORVASC) 10 MG tablet Take 1 tablet (10 mg total) by mouth daily.   Cholecalciferol (VITAMIN D3) 2000 units TABS Take 2,000 Units by mouth at bedtime.    Difluprednate 0.05 % EMUL Apply 1 drop to eye 4 (four) times daily.   docusate sodium (COLACE) 100 MG capsule Take 1 capsule (100 mg total) by mouth daily.   famotidine (PEPCID) 20 MG tablet Take 1 tablet (20 mg total) by mouth 2 (two) times daily as needed for heartburn or indigestion.   FEROSUL 325 (65 Fe) MG tablet Take 1 tablet (325 mg total) by mouth 2 (two) times daily with a meal. (Patient not taking: Reported on 08/05/2021)   losartan (COZAAR) 100 MG tablet Take 1 tablet (100 mg total) by mouth daily.   metFORMIN (GLUCOPHAGE-XR) 500 MG 24 hr tablet Take 1 tablet (500 mg total) by mouth 2 (two)  times daily with a meal.   metoprolol tartrate (LOPRESSOR) 25 MG tablet Take 1 tablet (25 mg total) by mouth 2 (two) times daily.   polyethylene glycol powder (GLYCOLAX/MIRALAX) 17 GM/SCOOP powder Take 1 Container by mouth as needed. Take one capful mix into water as needed for constipation.   pravastatin (PRAVACHOL) 40 MG tablet Take 1 tablet (40 mg total) by mouth daily.   No facility-administered encounter medications on file as of 10/19/2021.    Allergies (verified) Ace inhibitors and Codeine   History: Past Medical History:  Diagnosis Date   Cataract    GERD (gastroesophageal reflux disease)    HTN (hypertension)    Hypercholesteremia    Vitamin D deficiency    Past Surgical History:  Procedure Laterality Date   COLONOSCOPY WITH PROPOFOL N/A 05/06/2019   Procedure: COLONOSCOPY WITH PROPOFOL;  Surgeon: TVirgel Manifold MD;  Location: [facility] END[facility]COPY;  Service: Endoscopy;  Laterality: N/A;   KNEE ARTHROSCOPY WITH LATERAL MENISECTOMY Right 11/06/2017   Procedure: KNEE ARTHROSCOPY WITH REPAIR LATERAL MENISCAL ROOT TEAR;  Surgeon: PCorky Mull MD;  Location: [facility] ORS;  Service: Orthopedics;  Laterality: Right;   LAPAROSCOPIC HYSTERECTOMY     Family History  Problem Relation Age of Onset   Diabetes Mother    Hypertension Mother  Kidney disease Father    Heart disease Father        died from mi   Cancer Father        prostate   Hypertension Sister    Colon polyps Sister    Diabetes Sister    Hypertension Sister    Diabetes Sister    Hypertension Brother    Hypertension Brother    Cancer Paternal Grandmother        head and neck   Cancer Son    Colon cancer Neg Hx    Breast cancer Neg Hx    Adrenal disorder Neg Hx    Social History   Socioeconomic History   Marital status: Married    Spouse name: Not on file   Number of children: 3   Years of education: Not on file   Highest education level: Not on file  Occupational History   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  Tobacco Use   Smoking status: Former    Types: Cigarettes    Quit date: 12/07/1997    Years since quitting: 23.8   Smokeless tobacco: Never  Vaping Use   Vaping Use: Never used  Substance and Sexual Activity   Alcohol use: Not Currently   Drug use: No   Sexual activity: Not Currently  Other Topics Concern   Not on file  Social History Narrative   Regular Exercise -  NO   Daily Caffeine Use:  2-3 soda/sw tea      Retired   Investment banker, operational of Radio broadcast assistant Strain: Low Risk  (10/19/2021)   Overall Financial Resource Strain (CARDIA)    Difficulty of Paying Living Expenses: Not hard at all  Food Insecurity: No Food Insecurity (10/19/2021)   Hunger Vital Sign    Worried About Running Out of Food in the Last Year: Never true    Ran Out of Food in the Last Year: Never true  Transportation Needs: No Transportation Needs (10/19/2021)   PRAPARE - Hydrologist (Medical): No    Lack of Transportation (Non-Medical): No  Physical Activity: Sufficiently Active (10/19/2021)   Exercise Vital Sign    Days of Exercise per Week: 5 days    Minutes of Exercise per Session: 30 min  Stress: No Stress Concern Present (10/19/2021)   [place]    Feeling of Stress : Not at all  Social Connections: Unknown (10/19/2021)   Social Connection and Isolation Panel [NHANES]    Frequency of Communication with Friends and Family: More than three times a week    Frequency of Social Gatherings with Friends and Family: More than three times a week    Attends Religious Services: Not on Advertising copywriter or Organizations: Not on file    Attends Archivist Meetings: Not on file    Marital Status: Married    Tobacco Counseling Counseling given: Not Answered   Clinical Intake:  Pre-visit preparation completed: Yes                       Activities of Daily Living    10/19/2021    9:06 AM  In your present state of health, do you have any difficulty performing the following activities:  Hearing? 1  Comment Hearing aids  Vision? 0  Difficulty concentrating or making decisions? 0  Walking or climbing stairs? 0  Dressing  or bathing? 0  Doing errands, shopping? 0  Preparing Food and eating ? N  Using the Toilet? N  In the past six months, have you accidently leaked urine? N  Do you have problems with loss of bowel control? N  Managing your Medications? N  Managing your Finances? N  Housekeeping or managing your Housekeeping? N    Patient Care Team: Burnard Hawthorne, FNP as PCP - General (Family Medicine) Rockey Situ Kathlene November, MD as PCP - Cardiology (Cardiology) Clent Jacks, RN as Oncology Nurse Navigator Earlie Server, MD as Consulting Physician (Hematology and Oncology) [place], [place] any recent Medical Services you may have received from other than [facility] providers in the past year (date may be approximate).     Assessment:   This is a routine wellness examination for [place].  I connected with  Jennifer Huff on 10/19/21 by a audio enabled telemedicine application and verified that I am speaking with the correct person using two identifiers.  Patient Location: Home  Provider Location: Office/Clinic  I discussed the limitations of evaluation and management by telemedicine. The patient expressed understanding and agreed to proceed.   Hearing/Vision screen Hearing Screening - Comments:: Hearing aids Vision Screening - Comments:: Followed by [place] Followed by Dr. Serita Butcher, [facility] Wears corrective lenses Cataract extraction, bilateral They have seen their ophthalmologist in the last 12 months.    Dietary issues and exercise activities discussed: Current Exercise Habits: Home exercise routine, Type of exercise: treadmill;strength training/weights, Time  (Minutes): 30, Frequency (Times/Week): 5, Weekly Exercise (Minutes/Week): 150, Intensity: Mild   Goals Addressed               This Visit's Progress     Patient Stated     Increase physical activity (pt-stated)   On track     I will start walking more for exercise        Depression Screen    10/19/2021    9:06 AM 10/05/2021    2:39 PM 09/12/2021   12:19 PM 06/08/2021    9:10 AM 10/14/2020    9:13 AM 10/14/2019   12:11 PM 09/16/2019    9:58 AM  PHQ 2/9 Scores  PHQ - 2 Score 0 0 0 0 0 0 0  PHQ- 9 Score      0 0    Fall Risk    10/19/2021    9:06 AM 10/05/2021    2:38 PM 09/12/2021   12:19 PM 06/08/2021    9:10 AM 10/14/2020    9:13 AM  [place] in the past year? 0 0 0 0 0  Number falls in past yr: 0 0 0 0 0  Injury with Fall? 0 0 0 0   Risk for fall due to : No Fall Risks No Fall Risks No Fall Risks No Fall Risks   Follow up Falls evaluation completed Falls evaluation completed Falls evaluation completed Falls evaluation completed Falls evaluation completed    [place]: Home free of loose throw rugs in walkways, pet beds, electrical cords, etc? Yes  Adequate lighting in your home to reduce risk of falls? Yes   ASSISTIVE DEVICES UTILIZED TO PREVENT FALLS: Life alert? No  Use of a cane, walker or w/c? No  Grab bars in the bathroom? No  Shower chair or bench in shower? No  Elevated toilet seat or a handicapped toilet? No   TIMED UP AND GO: Was  the test performed? No .   Cognitive Function:        10/19/2021    9:09 AM 10/14/2020    9:50 AM 10/11/2018   11:55 AM  6CIT Screen  What Year? 0 points 0 points 0 points  What month? 0 points 0 points 0 points  What time? 0 points 0 points 0 points  Count back from 20 0 points 0 points 0 points  Months in reverse 0 points 0 points 0 points  Repeat phrase 0 points 0 points 0 points  Total Score 0 points 0 points 0 points    Immunizations Immunization History   Administered Date(s) Administered   Fluad Quad(high Dose 65+) 09/28/2018, 09/16/2019, 10/14/2020, 10/14/2021   Influenza Split 09/08/2010   Influenza,inj,Quad PF,6+ Mos 10/19/2016   Influenza-Unspecified 10/10/2011, 10/18/2012, 10/17/2013, 10/23/2014, 10/22/2015, 11/05/2017   Moderna Sars-Covid-2 Vaccination 03/06/2019, 04/03/2019, 11/10/2019, 08/02/2020   Pneumococcal Conjugate-13 04/18/2017, 09/16/2019   Pneumococcal Polysaccharide-23 12/08/2020   Tdap 06/09/2011   Zoster Recombinat (Shingrix) 06/11/2017, 09/06/2017   Zoster, Live 09/17/2011   Covid-19 vaccine status: Completed vaccines x4.  Screening Tests Health Maintenance  Topic Date Due   COVID-19 Vaccine (5 - Moderna series) 11/04/2021 (Originally 09/27/2020)   TETANUS/TDAP  06/09/2022 (Originally 06/08/2021)   HEMOGLOBIN A1C  03/13/2022   OPHTHALMOLOGY EXAM  07/22/2022   Diabetic kidney evaluation - Urine ACR  09/13/2022   FOOT EXAM  09/13/2022   Diabetic kidney evaluation - GFR measurement  10/06/2022   MAMMOGRAM  09/10/2023   COLONOSCOPY (Pts 45-39yr Insurance coverage will need to be confirmed)  05/05/2024   Pneumonia Vaccine 70 Years old  Completed   INFLUENZA VACCINE  Completed   DEXA SCAN  Completed   Hepatitis C Screening  Completed   Zoster Vaccines- Shingrix  Completed   HPV VACCINES  Aged Out    Health Maintenance There are no preventive care reminders to display for this patient.  Lung Cancer Screening: (Low Dose CT Chest recommended if Age 70-80years, 30 pack-year currently smoking OR have quit w/in 15years.) does not qualify.   Hepatitis C Screening: Completed 2017.  Vision Screening: Recommended annual ophthalmology exams for early detection of glaucoma and other disorders of the eye.  Dental Screening: Recommended annual dental exams for proper oral hygiene.  Community Resource Referral / Chronic Care Management: CRR required this visit?  No   CCM required this visit?  No      Plan:      I have personally reviewed and noted the following in the patient's chart:   Medical and social history Use of alcohol, tobacco or illicit drugs  Current medications and supplements including opioid prescriptions. Patient is not currently taking opioid prescriptions. Functional ability and status Nutritional status Physical activity Advanced directives List of other physicians Hospitalizations, surgeries, and ER visits in previous 12 months Vitals Screenings to include cognitive, depression, and falls Referrals and appointments  In addition, I have reviewed and discussed with patient certain preventive protocols, quality metrics, and best practice recommendations. A written personalized care plan for preventive services as well as general preventive health recommendations were provided to patient.     DLeta Jungling LPN   [id number]

## 2021-10-19 NOTE — Patient Instructions (Addendum)
[name] , Thank you for taking time to come for your Medicare Wellness Visit. I appreciate your ongoing commitment to your health goals. Please review the following plan we discussed and let me know if I can assist you in the future.   These are the goals we discussed:  Goals       Patient Stated     Increase physical activity (pt-stated)      I will start walking more for exercise         This is a list of the screening recommended for you and due dates:  Health Maintenance  Topic Date Due   COVID-19 Vaccine (5 - Moderna series) 11/04/2021*   Tetanus Vaccine  06/09/2022*   Hemoglobin A1C  03/13/2022   Eye exam for diabetics  07/22/2022   Yearly kidney health urinalysis for diabetes  09/13/2022   Complete foot exam   09/13/2022   Yearly kidney function blood test for diabetes  10/06/2022   Mammogram  09/10/2023   Colon Cancer Screening  05/05/2024   Pneumonia Vaccine  Completed   Flu Shot  Completed   DEXA scan (bone density measurement)  Completed   Hepatitis C Screening: USPSTF Recommendation to screen - Ages 31-79 yo.  Completed   Zoster (Shingles) Vaccine  Completed   HPV Vaccine  Aged Out  *Topic was postponed. The date shown is not the original due date.    Advanced directives: End of life planning; Advanced aging; Advanced directives discussed.  No HCPOA/Living Will.  Additional information declined at this time.  Conditions/risks identified: none new.   Next appointment: Follow up in one year for your annual wellness visit    Preventive Care 65 Years and Older, Female Preventive care refers to lifestyle choices and visits with your health care provider that can promote health and wellness. What does preventive care include? A yearly physical exam. This is also called an annual well check. Dental exams once or twice a year. Routine eye exams. Ask your health care provider how often you should have your eyes checked. Personal lifestyle choices,  including: Daily care of your teeth and gums. Regular physical activity. Eating a healthy diet. Avoiding tobacco and drug use. Limiting alcohol use. Practicing safe sex. Taking low-dose aspirin every day. Taking vitamin and mineral supplements as recommended by your health care provider. What happens during an annual well check? The services and screenings done by your health care provider during your annual well check will depend on your age, overall health, lifestyle risk factors, and family history of disease. Counseling  Your health care provider may ask you questions about your: Alcohol use. Tobacco use. Drug use. Emotional well-being. Home and relationship well-being. Sexual activity. Eating habits. History of falls. Memory and ability to understand (cognition). Work and work Statistician. Reproductive health. Screening  You may have the following tests or measurements: Height, weight, and BMI. Blood pressure. Lipid and cholesterol levels. These may be checked every 5 years, or more frequently if you are over 17 years old. Skin check. Lung cancer screening. You may have this screening every year starting at age 34 if you have a 30-pack-year history of smoking and currently smoke or have quit within the past 15 years. Fecal occult blood test (FOBT) of the stool. You may have this test every year starting at age 35. Flexible sigmoidoscopy or colonoscopy. You may have a sigmoidoscopy every 5 years or a colonoscopy every 10 years starting at age 72. Hepatitis C blood test. Hepatitis  B blood test. Sexually transmitted disease (STD) testing. Diabetes screening. This is done by checking your blood sugar (glucose) after you have not eaten for a while (fasting). You may have this done every 1-3 years. Bone density scan. This is done to screen for osteoporosis. You may have this done starting at age 50. Mammogram. This may be done every 1-2 years. Talk to your health care provider  about how often you should have regular mammograms. Talk with your health care provider about your test results, treatment options, and if necessary, the need for more tests. Vaccines  Your health care provider may recommend certain vaccines, such as: Influenza vaccine. This is recommended every year. Tetanus, diphtheria, and acellular pertussis (Tdap, Td) vaccine. You may need a Td booster every 10 years. Zoster vaccine. You may need this after age 71. Pneumococcal 13-valent conjugate (PCV13) vaccine. One dose is recommended after age 4. Pneumococcal polysaccharide (PPSV23) vaccine. One dose is recommended after age 17. Talk to your health care provider about which screenings and vaccines you need and how often you need them. This information is not intended to replace advice given to you by your health care provider. Make sure you discuss any questions you have with your health care provider. Document Released: 01/15/2015 Document Revised: 09/08/2015 Document Reviewed: 10/20/2014 Elsevier Interactive Patient Education  2017 [place] Prevention in the Home Falls can cause injuries. They can happen to people of all ages. There are many things you can do to make your home safe and to help prevent falls. What can I do on the outside of my home? Regularly fix the edges of walkways and driveways and fix any cracks. Remove anything that might make you trip as you walk through a door, such as a raised step or threshold. Trim any bushes or trees on the path to your home. Use bright outdoor lighting. Clear any walking paths of anything that might make someone trip, such as rocks or tools. Regularly check to see if handrails are loose or broken. Make sure that both sides of any steps have handrails. Any raised decks and porches should have guardrails on the edges. Have any leaves, snow, or ice cleared regularly. Use sand or salt on walking paths during winter. Clean up any spills in  your garage right away. This includes oil or grease spills. What can I do in the bathroom? Use night lights. Install grab bars by the toilet and in the tub and shower. Do not use towel bars as grab bars. Use non-skid mats or decals in the tub or shower. If you need to sit down in the shower, use a plastic, non-slip stool. Keep the floor dry. Clean up any water that spills on the floor as soon as it happens. Remove soap buildup in the tub or shower regularly. Attach bath mats securely with double-sided non-slip rug tape. Do not have throw rugs and other things on the floor that can make you trip. What can I do in the bedroom? Use night lights. Make sure that you have a light by your bed that is easy to reach. Do not use any sheets or blankets that are too big for your bed. They should not hang down onto the floor. Have a firm chair that has side arms. You can use this for support while you get dressed. Do not have throw rugs and other things on the floor that can make you trip. What can I do in the kitchen? Clean up any  spills right away. Avoid walking on wet floors. Keep items that you use a lot in easy-to-reach places. If you need to reach something above you, use a strong step stool that has a grab bar. Keep electrical cords out of the way. Do not use floor polish or wax that makes floors slippery. If you must use wax, use non-skid floor wax. Do not have throw rugs and other things on the floor that can make you trip. What can I do with my stairs? Do not leave any items on the stairs. Make sure that there are handrails on both sides of the stairs and use them. Fix handrails that are broken or loose. Make sure that handrails are as long as the stairways. Check any carpeting to make sure that it is firmly attached to the stairs. Fix any carpet that is loose or worn. Avoid having throw rugs at the top or bottom of the stairs. If you do have throw rugs, attach them to the floor with carpet  tape. Make sure that you have a light switch at the top of the stairs and the bottom of the stairs. If you do not have them, ask someone to add them for you. What else can I do to help prevent falls? Wear shoes that: Do not have high heels. Have rubber bottoms. Are comfortable and fit you well. Are closed at the toe. Do not wear sandals. If you use a stepladder: Make sure that it is fully opened. Do not climb a closed stepladder. Make sure that both sides of the stepladder are locked into place. Ask someone to hold it for you, if possible. Clearly mark and make sure that you can see: Any grab bars or handrails. First and last steps. Where the edge of each step is. Use tools that help you move around (mobility aids) if they are needed. These include: Canes. Walkers. Scooters. Crutches. Turn on the lights when you go into a dark area. Replace any light bulbs as soon as they burn out. Set up your furniture so you have a clear path. Avoid moving your furniture around. If any of your floors are uneven, fix them. If there are any pets around you, be aware of where they are. Review your medicines with your doctor. Some medicines can make you feel dizzy. This can increase your chance of falling. Ask your doctor what other things that you can do to help prevent falls. This information is not intended to replace advice given to you by your health care provider. Make sure you discuss any questions you have with your health care provider. Document Released: 10/15/2008 Document Revised: 05/27/2015 Document Reviewed: 01/23/2014 Elsevier Interactive Patient Education  2017 [employer].

## 2021-10-25 ENCOUNTER — Ambulatory Visit: Payer: Medicare PPO

## 2021-11-14 ENCOUNTER — Other Ambulatory Visit: Payer: Self-pay | Admitting: Family

## 2021-11-14 DIAGNOSIS — I10 Essential (primary) hypertension: Secondary | ICD-10-CM

## 2021-11-16 ENCOUNTER — Other Ambulatory Visit: Payer: Self-pay

## 2021-11-16 DIAGNOSIS — I10 Essential (primary) hypertension: Secondary | ICD-10-CM

## 2021-11-16 MED ORDER — METOPROLOL TARTRATE 25 MG PO TABS: 25.0000 mg | ORAL_TABLET | Freq: Two times a day (BID) | ORAL | 2 refills | Status: DC

## 2021-11-16 NOTE — Telephone Encounter (Signed)
RX SENT PT NOTIFIED

## 2022-01-10 DIAGNOSIS — N1831 Chronic kidney disease, stage 3a: Secondary | ICD-10-CM | POA: Diagnosis not present

## 2022-01-13 ENCOUNTER — Ambulatory Visit (INDEPENDENT_AMBULATORY_CARE_PROVIDER_SITE_OTHER): Payer: Medicare PPO | Admitting: Family

## 2022-01-13 ENCOUNTER — Encounter: Payer: Self-pay | Admitting: Family

## 2022-01-13 VITALS — BP 126/78 | HR 76 | Temp 98.3°F | Ht 66.0 in | Wt 243.4 lb

## 2022-01-13 DIAGNOSIS — E118 Type 2 diabetes mellitus with unspecified complications: Secondary | ICD-10-CM

## 2022-01-13 DIAGNOSIS — I10 Essential (primary) hypertension: Secondary | ICD-10-CM | POA: Diagnosis not present

## 2022-01-13 LAB — POCT GLYCOSYLATED HEMOGLOBIN (HGB A1C): Hemoglobin A1C: 5.8 % — AB (ref 4.0–5.6)

## 2022-01-13 NOTE — Assessment & Plan Note (Signed)
Chronic, stable.  Continue metoprolol 25 mg twice daily ,losartan '100mg'$  once daily, amlodipine 10 mg qd

## 2022-01-13 NOTE — Assessment & Plan Note (Signed)
Excellent control. Continue metformin '500mg'$  bid

## 2022-01-13 NOTE — Progress Notes (Signed)
Assessment & Plan:  Primary hypertension Assessment & Plan: Chronic, stable.  Continue metoprolol 25 mg twice daily ,losartan '100mg'$  once daily, amlodipine 10 mg qd   Controlled type 2 diabetes mellitus with complication, without long-term current use of insulin (HCC) Assessment & Plan: Excellent control. Continue metformin '500mg'$  bid  Orders: -     POCT glycosylated hemoglobin (Hb A1C)     Return precautions given.   Risks, benefits, and alternatives of the medications and treatment plan prescribed today were discussed, and patient expressed understanding.   Education regarding symptom management and diagnosis given to patient on AVS either electronically or printed.  Return in about 3 months (around 04/14/2022) for Follow Up Chronic Management.  Mable Paris, FNP  Subjective:    Patient ID: Jennifer Huff, female    DOB: 02-Nov-1950, 72 y.o.   MRN: [id number]  CC: Jennifer Huff is a 72 y.o. female who presents today for follow up.   HPI: Feels well today.  No new complaints  Diabetes -compliant metformin 1000 mg twice daily   HTN- compliant with metoprolol 25 mg twice daily ,losartan '100mg'$  once daily, amlodipine 10 mg . No cp, sob  She continues to follow with Dr. Harrie Jeans of [place] [facility] whom she sees next week.   Allergies: Ace inhibitors and Codeine Current Outpatient Medications on File Prior to Visit  Medication Sig Dispense Refill   acetaminophen (TYLENOL) 500 MG tablet Take 1,000 mg by mouth daily as needed for mild pain.     amLODipine (NORVASC) 10 MG tablet Take 1 tablet (10 mg total) by mouth daily. 90 tablet 3   Cholecalciferol (VITAMIN D3) 2000 units TABS Take 2,000 Units by mouth at bedtime.      Difluprednate 0.05 % EMUL Apply 1 drop to eye 4 (four) times daily.     docusate sodium (COLACE) 100 MG capsule Take 1 capsule (100 mg total) by mouth daily. 90 capsule 0   famotidine (PEPCID) 20 MG tablet Take 1 tablet (20 mg total) by  mouth 2 (two) times daily as needed for heartburn or indigestion. 180 tablet 3   losartan (COZAAR) 100 MG tablet Take 1 tablet (100 mg total) by mouth daily. 90 tablet 3   metFORMIN (GLUCOPHAGE-XR) 500 MG 24 hr tablet Take 1 tablet (500 mg total) by mouth 2 (two) times daily with a meal. 180 tablet 4   metoprolol tartrate (LOPRESSOR) 25 MG tablet Take 1 tablet (25 mg total) by mouth 2 (two) times daily. 60 tablet 2   polyethylene glycol powder (GLYCOLAX/MIRALAX) 17 GM/SCOOP powder Take 1 Container by mouth as needed. Take one capful mix into water as needed for constipation.     pravastatin (PRAVACHOL) 40 MG tablet Take 1 tablet (40 mg total) by mouth daily. 90 tablet 3   FEROSUL 325 (65 Fe) MG tablet Take 1 tablet (325 mg total) by mouth 2 (two) times daily with a meal. (Patient not taking: Reported on 08/05/2021) 180 tablet 3   No current facility-administered medications on file prior to visit.    Review of Systems  Constitutional:  Negative for chills and fever.  Respiratory:  Negative for cough.   Cardiovascular:  Negative for chest pain and palpitations.  Gastrointestinal:  Negative for nausea and vomiting.      Objective:    BP 126/78   Pulse 76   Temp 98.3 F (36.8 C) (Oral)   Ht '5\' 6"'$  (1.676 m)   Wt 243 lb 6.4 oz (110.4 kg)  SpO2 98%   BMI 39.29 kg/m  BP Readings from Last 3 Encounters:  01/13/22 126/78  10/05/21 128/82  09/25/21 (!) 174/97   Wt Readings from Last 3 Encounters:  01/13/22 243 lb 6.4 oz (110.4 kg)  10/19/21 254 lb (115.2 kg)  10/05/21 254 lb 1.6 oz (115.3 kg)    Physical Exam Vitals reviewed.  Constitutional:      Appearance: She is well-developed.  Eyes:     Conjunctiva/sclera: Conjunctivae normal.  Cardiovascular:     Rate and Rhythm: Normal rate and regular rhythm.     Pulses: Normal pulses.     Heart sounds: Normal heart sounds.  Pulmonary:     Effort: Pulmonary effort is normal.     Breath sounds: Normal breath sounds. No wheezing,  rhonchi or rales.  Skin:    General: Skin is warm and dry.  Neurological:     Mental Status: She is alert.  Psychiatric:        Speech: Speech normal.        Behavior: Behavior normal.        Thought Content: Thought content normal.

## 2022-01-17 DIAGNOSIS — E1122 Type 2 diabetes mellitus with diabetic chronic kidney disease: Secondary | ICD-10-CM | POA: Diagnosis not present

## 2022-01-17 DIAGNOSIS — I129 Hypertensive chronic kidney disease with stage 1 through stage 4 chronic kidney disease, or unspecified chronic kidney disease: Secondary | ICD-10-CM | POA: Diagnosis not present

## 2022-01-17 DIAGNOSIS — N281 Cyst of kidney, acquired: Secondary | ICD-10-CM | POA: Diagnosis not present

## 2022-01-17 DIAGNOSIS — E1129 Type 2 diabetes mellitus with other diabetic kidney complication: Secondary | ICD-10-CM | POA: Diagnosis not present

## 2022-01-17 DIAGNOSIS — K219 Gastro-esophageal reflux disease without esophagitis: Secondary | ICD-10-CM | POA: Diagnosis not present

## 2022-01-17 DIAGNOSIS — N1831 Chronic kidney disease, stage 3a: Secondary | ICD-10-CM | POA: Diagnosis not present

## 2022-01-17 DIAGNOSIS — E278 Other specified disorders of adrenal gland: Secondary | ICD-10-CM | POA: Diagnosis not present

## 2022-01-17 DIAGNOSIS — R809 Proteinuria, unspecified: Secondary | ICD-10-CM | POA: Diagnosis not present

## 2022-02-02 DIAGNOSIS — H5789 Other specified disorders of eye and adnexa: Secondary | ICD-10-CM | POA: Diagnosis not present

## 2022-02-02 DIAGNOSIS — K219 Gastro-esophageal reflux disease without esophagitis: Secondary | ICD-10-CM | POA: Diagnosis not present

## 2022-02-02 DIAGNOSIS — Z809 Family history of malignant neoplasm, unspecified: Secondary | ICD-10-CM | POA: Diagnosis not present

## 2022-02-02 DIAGNOSIS — Z87891 Personal history of nicotine dependence: Secondary | ICD-10-CM | POA: Diagnosis not present

## 2022-02-02 DIAGNOSIS — I10 Essential (primary) hypertension: Secondary | ICD-10-CM | POA: Diagnosis not present

## 2022-02-02 DIAGNOSIS — E119 Type 2 diabetes mellitus without complications: Secondary | ICD-10-CM | POA: Diagnosis not present

## 2022-02-02 DIAGNOSIS — Z8249 Family history of ischemic heart disease and other diseases of the circulatory system: Secondary | ICD-10-CM | POA: Diagnosis not present

## 2022-02-02 DIAGNOSIS — E785 Hyperlipidemia, unspecified: Secondary | ICD-10-CM | POA: Diagnosis not present

## 2022-02-07 DIAGNOSIS — H35352 Cystoid macular degeneration, left eye: Secondary | ICD-10-CM | POA: Diagnosis not present

## 2022-02-07 DIAGNOSIS — H9202 Otalgia, left ear: Secondary | ICD-10-CM | POA: Diagnosis not present

## 2022-02-07 DIAGNOSIS — T162XXA Foreign body in left ear, initial encounter: Secondary | ICD-10-CM | POA: Diagnosis not present

## 2022-02-07 DIAGNOSIS — E119 Type 2 diabetes mellitus without complications: Secondary | ICD-10-CM | POA: Diagnosis not present

## 2022-02-07 DIAGNOSIS — H3092 Unspecified chorioretinal inflammation, left eye: Secondary | ICD-10-CM | POA: Diagnosis not present

## 2022-02-08 DIAGNOSIS — N1831 Chronic kidney disease, stage 3a: Secondary | ICD-10-CM | POA: Diagnosis not present

## 2022-02-21 DIAGNOSIS — H3092 Unspecified chorioretinal inflammation, left eye: Secondary | ICD-10-CM | POA: Diagnosis not present

## 2022-03-09 DIAGNOSIS — H903 Sensorineural hearing loss, bilateral: Secondary | ICD-10-CM | POA: Diagnosis not present

## 2022-04-06 LAB — HM DIABETES EYE EXAM

## 2022-04-14 ENCOUNTER — Ambulatory Visit (INDEPENDENT_AMBULATORY_CARE_PROVIDER_SITE_OTHER): Payer: Medicare PPO | Admitting: Family

## 2022-04-14 VITALS — BP 128/74 | HR 64 | Temp 97.9°F | Ht 66.0 in | Wt 242.2 lb

## 2022-04-14 DIAGNOSIS — E118 Type 2 diabetes mellitus with unspecified complications: Secondary | ICD-10-CM

## 2022-04-14 DIAGNOSIS — I10 Essential (primary) hypertension: Secondary | ICD-10-CM

## 2022-04-14 DIAGNOSIS — Z1231 Encounter for screening mammogram for malignant neoplasm of breast: Secondary | ICD-10-CM

## 2022-04-14 DIAGNOSIS — Z780 Asymptomatic menopausal state: Secondary | ICD-10-CM

## 2022-04-14 LAB — BASIC METABOLIC PANEL
BUN: 10 mg/dL (ref 6–23)
CO2: 27 mEq/L (ref 19–32)
Calcium: 10.2 mg/dL (ref 8.4–10.5)
Chloride: 108 mEq/L (ref 96–112)
Creatinine, Ser: 1.43 mg/dL — ABNORMAL HIGH (ref 0.40–1.20)
GFR: 37.14 mL/min — ABNORMAL LOW (ref >60.00)
Glucose, Bld: 102 mg/dL — ABNORMAL HIGH (ref 70–99)
Potassium: 3.8 mEq/L (ref 3.5–5.1)
Sodium: 143 mEq/L (ref 135–145)

## 2022-04-14 LAB — MICROALBUMIN / CREATININE URINE RATIO
Creatinine,U: 91.2 mg/dL
Microalb Creat Ratio: 14.4 mg/g (ref 0.0–30.0)
Microalb, Ur: 13.1 mg/dL — ABNORMAL HIGH (ref 0.0–1.9)

## 2022-04-14 LAB — HEMOGLOBIN A1C: Hgb A1c MFr Bld: 6 % (ref 4.6–6.5)

## 2022-04-14 NOTE — Progress Notes (Signed)
Assessment & Plan:  Controlled type 2 diabetes mellitus with complication, without long-term current use of insulin Assessment & Plan: Patient recently started on Farxiga 10 mg.  I also increase losartan to 100 mg 8 months ago.  Pending microalbumin however anticipate improvement.  Discussed with patient that based on A1c, pending today, we could consider trial stop metformin as likely she would not need to since the initiation Farxiga.  She  declines making any changes at this time.  We agreed to stay on metformin 500 mg twice daily for now.  Continue Farxiga 10 mg as prescribed by Dr. Malen Gauze, nephrology  Orders: -     Microalbumin / creatinine urine ratio -     Basic metabolic panel -     Hemoglobin A1c  Primary hypertension Assessment & Plan: Chronic, stable.  Continue metoprolol 25 mg twice daily ,losartan 100mg  once daily, amlodipine 10 mg qd      Return precautions given.   Risks, benefits, and alternatives of the medications and treatment plan prescribed today were discussed, and patient expressed understanding.   Education regarding symptom management and diagnosis given to patient on AVS either electronically or printed.  No follow-ups on file.  Rennie Plowman, FNP  Subjective:    Patient ID: Jennifer Huff, female    DOB: 1950-03-08, 72 y.o.   MRN: [id number]  CC: Jennifer Huff is a 72 y.o. female who presents today for follow up.   HPI: Feels well today.  No new concerns   compliant with metformin 500mg  BID.  Occasional loose stools.   She follows with Dr Malen Gauze who started farxiga 10mg  4 months ago.    History of microalbuminuria.  She is compliant with losartan 100 mg daily. Allergies: Ace inhibitors and Codeine Current Outpatient Medications on File Prior to Visit  Medication Sig Dispense Refill   acetaminophen (TYLENOL) 500 MG tablet Take 1,000 mg by mouth daily as needed for mild pain.     amLODipine (NORVASC) 10 MG tablet Take 1 tablet  (10 mg total) by mouth daily. 90 tablet 3   Cholecalciferol (VITAMIN D3) 2000 units TABS Take 2,000 Units by mouth at bedtime.      dapagliflozin propanediol (FARXIGA) 10 MG TABS tablet Take 10 mg by mouth daily.     Difluprednate 0.05 % EMUL Apply 1 drop to eye 4 (four) times daily.     docusate sodium (COLACE) 100 MG capsule Take 1 capsule (100 mg total) by mouth daily. 90 capsule 0   famotidine (PEPCID) 20 MG tablet Take 1 tablet (20 mg total) by mouth 2 (two) times daily as needed for heartburn or indigestion. 180 tablet 3   losartan (COZAAR) 100 MG tablet Take 1 tablet (100 mg total) by mouth daily. 90 tablet 3   metFORMIN (GLUCOPHAGE-XR) 500 MG 24 hr tablet Take 1 tablet (500 mg total) by mouth 2 (two) times daily with a meal. 180 tablet 4   metoprolol tartrate (LOPRESSOR) 25 MG tablet Take 1 tablet (25 mg total) by mouth 2 (two) times daily. 60 tablet 2   polyethylene glycol powder (GLYCOLAX/MIRALAX) 17 GM/SCOOP powder Take 1 Container by mouth as needed. Take one capful mix into water as needed for constipation.     pravastatin (PRAVACHOL) 40 MG tablet Take 1 tablet (40 mg total) by mouth daily. 90 tablet 3   No current facility-administered medications on file prior to visit.    Review of Systems  Constitutional:  Negative for chills and fever.  Respiratory:  Negative for cough.   Cardiovascular:  Negative for chest pain and palpitations.  Gastrointestinal:  Negative for nausea and vomiting.      Objective:    BP 128/74   Pulse 64   Temp 97.9 F (36.6 C) (Oral)   Ht  (1.676 m)   Wt 242 lb 3.2 oz (109.9 kg)   SpO2 97%   BMI 39.09 kg/m  BP Readings from Last 3 Encounters:  04/14/22 128/74  01/13/22 126/78  10/05/21 128/82   Wt Readings from Last 3 Encounters:  04/14/22 242 lb 3.2 oz (109.9 kg)  01/13/22 243 lb 6.4 oz (110.4 kg)  10/19/21 254 lb (115.2 kg)    Physical Exam Vitals reviewed.  Constitutional:      Appearance: She is well-developed.  Eyes:      Conjunctiva/sclera: Conjunctivae normal.  Cardiovascular:     Rate and Rhythm: Normal rate and regular rhythm.     Pulses: Normal pulses.     Heart sounds: Normal heart sounds.  Pulmonary:     Effort: Pulmonary effort is normal.     Breath sounds: Normal breath sounds. No wheezing, rhonchi or rales.  Skin:    General: Skin is warm and dry.  Neurological:     Mental Status: She is alert.  Psychiatric:        Speech: Speech normal.        Behavior: Behavior normal.        Thought Content: Thought content normal.

## 2022-04-14 NOTE — Patient Instructions (Signed)
Please call  and schedule your 3D mammogram and /or bone density scan as we discussed. This is due LATE September.    [facility]  ( new location in 2023)  [address], [place], [place] [address]  [place], [place]  [phone]

## 2022-04-14 NOTE — Assessment & Plan Note (Signed)
Patient recently started on Farxiga 10 mg.  I also increase losartan to 100 mg 8 months ago.  Pending microalbumin however anticipate improvement.  Discussed with patient that based on A1c, pending today, we could consider trial stop metformin as likely she would not need to since the initiation Farxiga.  She  declines making any changes at this time.  We agreed to stay on metformin 500 mg twice daily for now.  Continue Farxiga 10 mg as prescribed by Dr. Malen Gauze, nephrology

## 2022-04-14 NOTE — Assessment & Plan Note (Signed)
Chronic, stable.  Continue metoprolol 25 mg twice daily ,losartan 100mg once daily, amlodipine 10 mg qd 

## 2022-05-10 DIAGNOSIS — H90A22 Sensorineural hearing loss, unilateral, left ear, with restricted hearing on the contralateral side: Secondary | ICD-10-CM | POA: Diagnosis not present

## 2022-05-26 DIAGNOSIS — H3092 Unspecified chorioretinal inflammation, left eye: Secondary | ICD-10-CM | POA: Diagnosis not present

## 2022-08-01 ENCOUNTER — Other Ambulatory Visit: Payer: Self-pay | Admitting: Family

## 2022-08-01 DIAGNOSIS — I10 Essential (primary) hypertension: Secondary | ICD-10-CM

## 2022-08-18 DIAGNOSIS — H3092 Unspecified chorioretinal inflammation, left eye: Secondary | ICD-10-CM | POA: Diagnosis not present

## 2022-09-12 ENCOUNTER — Ambulatory Visit
Admission: RE | Admit: 2022-09-12 | Discharge: 2022-09-12 | Disposition: A | Discharge status: Home / Self Care | Payer: Medicare PPO | Source: Ambulatory Visit | Attending: Family | Admitting: Family

## 2022-09-12 DIAGNOSIS — Z780 Asymptomatic menopausal state: Secondary | ICD-10-CM

## 2022-09-12 DIAGNOSIS — Z1231 Encounter for screening mammogram for malignant neoplasm of breast: Secondary | ICD-10-CM

## 2022-09-29 ENCOUNTER — Other Ambulatory Visit: Payer: Self-pay | Admitting: Family

## 2022-09-29 DIAGNOSIS — I10 Essential (primary) hypertension: Secondary | ICD-10-CM

## 2022-09-29 NOTE — Telephone Encounter (Signed)
Called and got pt scheduled as her last appt was back in April, provider did not have any check out notes when she checked out so she did not know when to make an appt

## 2022-10-18 ENCOUNTER — Ambulatory Visit: Payer: Medicare PPO | Admitting: Family

## 2022-10-19 ENCOUNTER — Ambulatory Visit (INDEPENDENT_AMBULATORY_CARE_PROVIDER_SITE_OTHER): Payer: Medicare PPO | Admitting: Family

## 2022-10-19 ENCOUNTER — Encounter: Payer: Self-pay | Admitting: Family

## 2022-10-19 VITALS — BP 128/78 | HR 78 | Temp 97.2°F | Ht 66.0 in | Wt 244.2 lb

## 2022-10-19 DIAGNOSIS — I10 Essential (primary) hypertension: Secondary | ICD-10-CM

## 2022-10-19 DIAGNOSIS — Z23 Encounter for immunization: Secondary | ICD-10-CM

## 2022-10-19 DIAGNOSIS — R7309 Other abnormal glucose: Secondary | ICD-10-CM

## 2022-10-19 DIAGNOSIS — E118 Type 2 diabetes mellitus with unspecified complications: Secondary | ICD-10-CM

## 2022-10-19 DIAGNOSIS — Z7984 Long term (current) use of oral hypoglycemic drugs: Secondary | ICD-10-CM | POA: Diagnosis not present

## 2022-10-19 DIAGNOSIS — E785 Hyperlipidemia, unspecified: Secondary | ICD-10-CM

## 2022-10-19 LAB — COMPREHENSIVE METABOLIC PANEL
ALT: 8 U/L (ref 0–35)
AST: 14 U/L (ref 0–37)
Albumin: 4 g/dL (ref 3.5–5.2)
Alkaline Phosphatase: 81 U/L (ref 39–117)
BUN: 12 mg/dL (ref 6–23)
CO2: 28 meq/L (ref 19–32)
Calcium: 10.3 mg/dL (ref 8.4–10.5)
Chloride: 107 meq/L (ref 96–112)
Creatinine, Ser: 1.43 mg/dL — ABNORMAL HIGH (ref 0.40–1.20)
GFR: 37.01 mL/min — ABNORMAL LOW (ref >60.00)
Glucose, Bld: 109 mg/dL — ABNORMAL HIGH (ref 70–99)
Potassium: 3.5 meq/L (ref 3.5–5.1)
Sodium: 142 meq/L (ref 135–145)
Total Bilirubin: 0.7 mg/dL (ref 0.2–1.2)
Total Protein: 7.1 g/dL (ref 6.0–8.3)

## 2022-10-19 LAB — POCT GLYCOSYLATED HEMOGLOBIN (HGB A1C): Hemoglobin A1C: 5.8 % — AB (ref 4.0–5.6)

## 2022-10-19 LAB — LIPID PANEL
Cholesterol: 131 mg/dL (ref 0–200)
HDL: 58.9 mg/dL (ref >39.00)
LDL Cholesterol: 56 mg/dL (ref 0–99)
NonHDL: 71.73
Total CHOL/HDL Ratio: 2
Triglycerides: 80 mg/dL (ref 0.0–149.0)
VLDL: 16 mg/dL (ref 0.0–40.0)

## 2022-10-19 LAB — MICROALBUMIN / CREATININE URINE RATIO
Creatinine,U: 103.2 mg/dL
Microalb Creat Ratio: 15.8 mg/g (ref 0.0–30.0)
Microalb, Ur: 16.3 mg/dL — ABNORMAL HIGH (ref 0.0–1.9)

## 2022-10-19 MED ORDER — LOSARTAN POTASSIUM 100 MG PO TABS: 100.0000 mg | ORAL_TABLET | Freq: Every day | ORAL | 3 refills | Status: DC

## 2022-10-19 MED ORDER — AMLODIPINE BESYLATE 10 MG PO TABS
10.0000 mg | ORAL_TABLET | Freq: Every day | ORAL | 3 refills | Status: DC
Start: 2022-10-19 — End: 2023-11-15

## 2022-10-19 MED ORDER — PRAVASTATIN SODIUM 40 MG PO TABS
40.0000 mg | ORAL_TABLET | Freq: Every day | ORAL | 3 refills | Status: DC
Start: 2022-10-19 — End: 2023-07-23

## 2022-10-19 NOTE — Progress Notes (Signed)
Assessment & Plan:  Controlled type 2 diabetes mellitus with complication, without long-term current use of insulin (HCC) Assessment & Plan: Lab Results  Component Value Date   HGBA1C 5.8 (A) 10/19/2022   Excellent control.  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  She continues to follow with Dr. Malen Gauze, at [facility].  Continue metformin 500 mg twice daily , Farxiga 10 mg,losartan to 100 mg for renal protection.   Orders: -     Microalbumin / creatinine urine ratio -     Comprehensive metabolic panel -     Lipid panel  Elevated glucose -     POCT glycosylated hemoglobin (Hb A1C)  Essential hypertension -     amLODIPine Besylate; Take 1 tablet (10 mg total) by mouth daily.  Dispense: 90 tablet; Refill: 3 -     Losartan Potassium; Take 1 tablet (100 mg total) by mouth daily.  Dispense: 90 tablet; Refill: 3  Hyperlipidemia, unspecified hyperlipidemia type -     Pravastatin Sodium; Take 1 tablet (40 mg total) by mouth daily.  Dispense: 90 tablet; Refill: 3  Primary hypertension Assessment & Plan: Chronic, stable.  Continue metoprolol 25 mg twice daily ,losartan 100mg  once daily, amlodipine 10 mg qd      Return precautions given.   Risks, benefits, and alternatives of the medications and treatment plan prescribed today were discussed, and patient expressed understanding.   Education regarding symptom management and diagnosis given to patient on AVS either electronically or printed.  Return in about 6 months (around 04/19/2023).  Rennie Plowman, FNP  Subjective:    Patient ID: Jennifer Huff, female    DOB: 1950-03-28, 72 y.o.   MRN: [id number]  CC: Jennifer Huff is a 72 y.o. female who presents today for follow up.   HPI: Feels well today.  No new complaints  She is compliant with metformin 500 mg twice daily She is going to the gym approximately 3 days/week.  Allergies: Ace inhibitors and Codeine Current Outpatient Medications on File  Prior to Visit  Medication Sig Dispense Refill   acetaminophen (TYLENOL) 500 MG tablet Take 1,000 mg by mouth daily as needed for mild pain.     Cholecalciferol (VITAMIN D3) 2000 units TABS Take 2,000 Units by mouth at bedtime.      dapagliflozin propanediol (FARXIGA) 10 MG TABS tablet Take 10 mg by mouth daily.     Difluprednate 0.05 % EMUL Apply 1 drop to eye 4 (four) times daily.     docusate sodium (COLACE) 100 MG capsule Take 1 capsule (100 mg total) by mouth daily. 90 capsule 0   famotidine (PEPCID) 20 MG tablet Take 1 tablet (20 mg total) by mouth 2 (two) times daily as needed for heartburn or indigestion. 180 tablet 3   metFORMIN (GLUCOPHAGE-XR) 500 MG 24 hr tablet Take 1 tablet (500 mg total) by mouth 2 (two) times daily with a meal. 180 tablet 4   metoprolol tartrate (LOPRESSOR) 25 MG tablet Take 1 tablet (25 mg total) by mouth 2 (two) times daily. 180 tablet 3   polyethylene glycol powder (GLYCOLAX/MIRALAX) 17 GM/SCOOP powder Take 1 Container by mouth as needed. Take one capful mix into water as needed for constipation.     No current facility-administered medications on file prior to visit.    Review of Systems  Constitutional:  Negative for chills and fever.  Respiratory:  Negative for cough.   Cardiovascular:  Negative for chest pain and palpitations.  Gastrointestinal:  Negative for nausea and  vomiting.      Objective:    BP 128/78   Pulse 78   Temp (!) 97.2 F (36.2 C) (Oral)   Ht 5\' 6"  (1.676 m)   Wt 244 lb 3.2 oz (110.8 kg)   SpO2 98%   BMI 39.41 kg/m  BP Readings from Last 3 Encounters:  10/19/22 128/78  04/14/22 128/74  01/13/22 126/78   Wt Readings from Last 3 Encounters:  10/19/22 244 lb 3.2 oz (110.8 kg)  04/14/22 242 lb 3.2 oz (109.9 kg)  01/13/22 243 lb 6.4 oz (110.4 kg)    Physical Exam Vitals reviewed.  Constitutional:      Appearance: She is well-developed.  Eyes:     Conjunctiva/sclera: Conjunctivae normal.  Cardiovascular:     Rate and  Rhythm: Normal rate and regular rhythm.     Pulses: Normal pulses.     Heart sounds: Normal heart sounds.  Pulmonary:     Effort: Pulmonary effort is normal.     Breath sounds: Normal breath sounds. No wheezing, rhonchi or rales.  Skin:    General: Skin is warm and dry.  Neurological:     Mental Status: She is alert.  Psychiatric:        Speech: Speech normal.        Behavior: Behavior normal.        Thought Content: Thought content normal.

## 2022-10-19 NOTE — Addendum Note (Signed)
Addended by: [place], Xaria Judon on: 10/19/2022 03:29 PM   Modules accepted: Orders

## 2022-10-19 NOTE — Assessment & Plan Note (Signed)
Lab Results  Component Value Date   HGBA1C 5.8 (A) 10/19/2022   Excellent control.  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  She continues to follow with Dr. Malen Gauze, at [facility].  Continue metformin 500 mg twice daily , Farxiga 10 mg,losartan to 100 mg for renal protection.

## 2022-10-19 NOTE — Assessment & Plan Note (Signed)
Chronic, stable.  Continue metoprolol 25 mg twice daily ,losartan 100mg  once daily, amlodipine 10 mg qd

## 2022-10-24 ENCOUNTER — Encounter: Payer: Self-pay | Admitting: Family

## 2022-10-24 ENCOUNTER — Ambulatory Visit (INDEPENDENT_AMBULATORY_CARE_PROVIDER_SITE_OTHER): Payer: Medicare PPO | Admitting: *Deleted

## 2022-10-24 VITALS — Ht 66.0 in | Wt 244.0 lb

## 2022-10-24 DIAGNOSIS — Z0000 Encounter for general adult medical examination without abnormal findings: Secondary | ICD-10-CM

## 2022-10-24 NOTE — Progress Notes (Signed)
Subjective:   Jennifer Huff is a 72 y.o. female who presents for Medicare Annual (Subsequent) preventive examination.  Visit Complete: Virtual I connected with  Jennifer Huff on 10/24/22 by a audio enabled telemedicine application and verified that I am speaking with the correct person using two identifiers.  Patient Location: Home  Provider Location: Office/Clinic  I discussed the limitations of evaluation and management by telemedicine. The patient expressed understanding and agreed to proceed.  Vital Signs: Because this visit was a virtual/telehealth visit, some criteria may be missing or patient reported. Any vitals not documented were not able to be obtained and vitals that have been documented are patient reported.  Patient Medicare AWV questionnaire was completed by the patient on 10/23/22; I have confirmed that all information answered by patient is correct and no changes since this date.  Cardiac Risk Factors include: advanced age (>57men, >44 women);diabetes mellitus;dyslipidemia;hypertension;obesity (BMI >30kg/m2)     Objective:    Today's Vitals   10/24/22 1246  Weight: 244 lb (110.7 kg)  Height: 5\' 6"  (1.676 m)   Body mass index is 39.38 kg/m.     10/24/2022   12:58 PM 10/19/2021    9:05 AM 03/31/2021    9:52 AM 10/14/2020    9:12 AM 10/14/2019   12:13 PM 09/25/2019   10:33 AM 06/23/2019    8:52 AM  Advanced Directives  Does Patient Have a Medical Advance Directive? No No No No No No No  Would patient like information on creating a medical advance directive? No - Patient declined No - Patient declined  No - Patient declined No - Patient declined  Yes (MAU/Ambulatory/Procedural Areas - Information given)    Current Medications (verified) Outpatient Encounter Medications as of 10/24/2022  Medication Sig   acetaminophen (TYLENOL) 500 MG tablet Take 1,000 mg by mouth daily as needed for mild pain.   amLODipine (NORVASC) 10 MG tablet Take 1  tablet (10 mg total) by mouth daily.   Cholecalciferol (VITAMIN D3) 2000 units TABS Take 2,000 Units by mouth at bedtime.    dapagliflozin propanediol (FARXIGA) 10 MG TABS tablet Take 10 mg by mouth daily.   docusate sodium (COLACE) 100 MG capsule Take 1 capsule (100 mg total) by mouth daily.   famotidine (PEPCID) 20 MG tablet Take 1 tablet (20 mg total) by mouth 2 (two) times daily as needed for heartburn or indigestion.   ketorolac (ACULAR) 0.5 % ophthalmic solution Place 1 drop into the left eye 2 (two) times daily.   losartan (COZAAR) 100 MG tablet Take 1 tablet (100 mg total) by mouth daily.   metFORMIN (GLUCOPHAGE-XR) 500 MG 24 hr tablet Take 1 tablet (500 mg total) by mouth 2 (two) times daily with a meal.   metoprolol tartrate (LOPRESSOR) 25 MG tablet Take 1 tablet (25 mg total) by mouth 2 (two) times daily.   polyethylene glycol powder (GLYCOLAX/MIRALAX) 17 GM/SCOOP powder Take 1 Container by mouth as needed. Take one capful mix into water as needed for constipation.   pravastatin (PRAVACHOL) 40 MG tablet Take 1 tablet (40 mg total) by mouth daily.   Difluprednate 0.05 % EMUL Apply 1 drop to eye 4 (four) times daily. (Patient not taking: Reported on 10/24/2022)   No facility-administered encounter medications on file as of 10/24/2022.    Allergies (verified) Ace inhibitors and Codeine   History: Past Medical History:  Diagnosis Date   Cataract    GERD (gastroesophageal reflux disease)    HTN (hypertension)  Hypercholesteremia    Vitamin D deficiency    Past Surgical History:  Procedure Laterality Date   COLONOSCOPY WITH PROPOFOL N/A 05/06/2019   Procedure: COLONOSCOPY WITH PROPOFOL;  Surgeon: Pasty Spillers, MD;  Location: [facility] ENDOSCOPY;  Service: Endoscopy;  Laterality: N/A;   KNEE ARTHROSCOPY WITH LATERAL MENISECTOMY Right 11/06/2017   Procedure: KNEE ARTHROSCOPY WITH REPAIR LATERAL MENISCAL ROOT TEAR;  Surgeon: Christena Flake, MD;  Location: [facility] ORS;  Service:  Orthopedics;  Laterality: Right;   LAPAROSCOPIC HYSTERECTOMY     Family History  Problem Relation Age of Onset   Diabetes Mother    Hypertension Mother    Kidney disease Father    Heart disease Father        died from mi   Cancer Father        prostate   Hypertension Sister    Colon polyps Sister    Diabetes Sister    Hypertension Sister    Diabetes Sister    Hypertension Brother    Hypertension Brother    Cancer Paternal Grandmother        head and neck   Cancer Son    Colon cancer Neg Hx    Breast cancer Neg Hx    Adrenal disorder Neg Hx    Social History   Socioeconomic History   Marital status: Married    Spouse name: Not on file   Number of children: 3   Years of education: Not on file   Highest education level: 12th grade  Occupational History   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  Tobacco Use   Smoking status: Former    Current packs/day: 0.00    Types: Cigarettes    Quit date: 12/07/1997    Years since quitting: 24.8   Smokeless tobacco: Never  Vaping Use   Vaping status: Never Used  Substance and Sexual Activity   Alcohol use: Not Currently   Drug use: No   Sexual activity: Not Currently  Other Topics Concern   Not on file  Social History Narrative   Regular Exercise -  NO   Daily Caffeine Use:  2-3 soda/sw tea      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 Strain: Low Risk  (10/23/2022)   Overall Financial Resource Strain (CARDIA)    Difficulty of Paying Living Expenses: Not hard at all  Food Insecurity: No Food Insecurity (10/23/2022)   Hunger Vital Sign    Worried About Running Out of Food in the Last Year: Never true    Ran Out of Food in the Last Year: Never true  Transportation Needs: No Transportation Needs (10/23/2022)   PRAPARE - Administrator, Civil Service (Medical): No    Lack of Transportation (Non-Medical): No  Physical Activity: Insufficiently Active (10/23/2022)   Exercise Vital Sign     Days of Exercise per Week: 3 days    Minutes of Exercise per Session: 30 min  Stress: No Stress Concern Present (10/23/2022)   [employer] of Occupational Health - Occupational Stress Questionnaire    Feeling of Stress : Not at all  Social Connections: Moderately Integrated (10/23/2022)   Social Connection and Isolation Panel [NHANES]    Frequency of Communication with Friends and Family: More than three times a week    Frequency of Social Gatherings with Friends and Family: Twice a week    Attends Religious Services: More than 4 times per year    Active Member of  Clubs or Organizations: No    Attends Banker Meetings: Never    Marital Status: Married    Tobacco Counseling Counseling given: Not Answered   Clinical Intake:  Pre-visit preparation completed: Yes  Pain : No/denies pain     BMI - recorded: 39.38 Nutritional Status: BMI > 30  Obese Nutritional Risks: None Diabetes: Yes CBG done?: No Did pt. bring in CBG monitor from home?: No  How often do you need to have someone help you when you read instructions, pamphlets, or other written materials from your doctor or pharmacy?: 1 - Never  Interpreter Needed?: No  Information entered by :: R. Lynelle Weiler LPN   Activities of Daily Living    10/23/2022    6:06 PM  In your present state of health, do you have any difficulty performing the following activities:  Hearing? 1  Comment wears aids  Vision? 1  Difficulty concentrating or making decisions? 0  Walking or climbing stairs? 0  Dressing or bathing? 0  Doing errands, shopping? 0  Preparing Food and eating ? N  Using the Toilet? N  In the past six months, have you accidently leaked urine? N  Do you have problems with loss of bowel control? N  Managing your Medications? N  Managing your Finances? N  Housekeeping or managing your Housekeeping? N    Patient Care Team: Allegra Grana, FNP as PCP - General (Family Medicine) Mariah Milling Tollie Pizza,  MD as PCP - Cardiology (Cardiology) Benita Gutter, RN as Oncology Nurse Navigator Rickard Patience, MD as Consulting Physician (Hematology and Oncology) Pa, [facility]  Indicate any recent Medical Services you may have received from other than [facility] providers in the past year (date may be approximate).     Assessment:   This is a routine wellness examination for Ozan.  Hearing/Vision screen Hearing Screening - Comments:: Wears aids Vision Screening - Comments:: glasses   Goals Addressed             This Visit's Progress    Patient Stated       Continue to exercise and better eating habits       Depression Screen    10/24/2022   12:54 PM 10/19/2022   11:09 AM 04/14/2022    8:57 AM 10/19/2021    9:06 AM 10/05/2021    2:39 PM 09/12/2021   12:19 PM 06/08/2021    9:10 AM  PHQ 2/9 Scores  PHQ - 2 Score 0 0 0 0 0 0 0  PHQ- 9 Score 0          Fall Risk    10/23/2022    6:06 PM 10/19/2022   11:08 AM 04/14/2022    8:57 AM 01/13/2022    9:02 AM 10/19/2021    9:06 AM  Fall Risk   Falls in the past year? 0 0 0 0 0  Number falls in past yr: 0 0 0 0 0  Injury with Fall? 0 0 0 0 0  Risk for fall due to : No Fall Risks No Fall Risks No Fall Risks No Fall Risks No Fall Risks  Follow up Falls prevention discussed;Falls evaluation completed Falls evaluation completed Falls evaluation completed Falls evaluation completed Falls evaluation completed    MEDICARE RISK AT HOME: Medicare Risk at Home Any stairs in or around the home?: Yes If so, are there any without handrails?: No Home free of loose throw rugs in walkways, pet beds, electrical cords, etc?: Yes Adequate  lighting in your home to reduce risk of falls?: Yes Life alert?: No Use of a cane, walker or w/c?: No Grab bars in the bathroom?: Yes Shower chair or bench in shower?: Yes Elevated toilet seat or a handicapped toilet?: No    Cognitive Function:        10/24/2022   12:59 PM 10/19/2021    9:09 AM  10/14/2020    9:50 AM 10/11/2018   11:55 AM  6CIT Screen  What Year? 0 points 0 points 0 points 0 points  What month? 0 points 0 points 0 points 0 points  What time? 0 points 0 points 0 points 0 points  Count back from 20 0 points 0 points 0 points 0 points  Months in reverse 0 points 0 points 0 points 0 points  Repeat phrase 2 points 0 points 0 points 0 points  Total Score 2 points 0 points 0 points 0 points    Immunizations Immunization History  Administered Date(s) Administered   Fluad Quad(high Dose 65+) 09/28/2018, 09/16/2019, 10/14/2020, 10/14/2021   Fluad Trivalent(High Dose 65+) 10/19/2022   Influenza Split 09/08/2010   Influenza,inj,Quad PF,6+ Mos 10/19/2016   Influenza-Unspecified 10/10/2011, 10/18/2012, 10/17/2013, 10/23/2014, 10/22/2015, 11/05/2017   Moderna Sars-Covid-2 Vaccination 03/06/2019, 04/03/2019, 11/10/2019, 08/02/2020   Pfizer(Comirnaty)Fall Seasonal Vaccine 12 years and older 11/16/2021   Pneumococcal Conjugate-13 04/18/2017, 09/16/2019   Pneumococcal Polysaccharide-23 12/08/2020   Rsv, Bivalent, Protein Subunit Rsvpref,pf Verdis Frederickson) 11/16/2021   Tdap 06/09/2011   Zoster Recombinant(Shingrix) 06/11/2017, 09/06/2017   Zoster, Live 09/17/2011    TDAP status: Due, Education has been provided regarding the importance of this vaccine. Advised may receive this vaccine at local pharmacy or Health Dept. Aware to provide a copy of the vaccination record if obtained from local pharmacy or Health Dept. Verbalized acceptance and understanding.  Flu Vaccine status: Up to date  Pneumococcal vaccine status: Up to date  Covid-19 vaccine status: Information provided on how to obtain vaccines.   Qualifies for Shingles Vaccine? Yes   Zostavax completed Yes   Shingrix Completed?: Yes  Screening Tests Health Maintenance  Topic Date Due   DTaP/Tdap/Td (2 - Td or Tdap) 06/08/2021   OPHTHALMOLOGY EXAM  07/22/2022   COVID-19 Vaccine (6 - 2023-24 season) 09/03/2022    Medicare Annual Wellness (AWV)  10/20/2022   HEMOGLOBIN A1C  04/19/2023   Diabetic kidney evaluation - eGFR measurement  10/19/2023   Diabetic kidney evaluation - Urine ACR  10/19/2023   FOOT EXAM  10/19/2023   Colonoscopy  05/05/2024   MAMMOGRAM  09/11/2024   DEXA SCAN  09/11/2024   Pneumonia Vaccine 57+ Years old  Completed   INFLUENZA VACCINE  Completed   Hepatitis C Screening  Completed   Zoster Vaccines- Shingrix  Completed   HPV VACCINES  Aged Out    Health Maintenance  Health Maintenance Due  Topic Date Due   DTaP/Tdap/Td (2 - Td or Tdap) 06/08/2021   OPHTHALMOLOGY EXAM  07/22/2022   COVID-19 Vaccine (6 - 2023-24 season) 09/03/2022   Medicare Annual Wellness (AWV)  10/20/2022    Colorectal cancer screening: Type of screening: Colonoscopy. Completed 05/2019. Repeat every 5 years  Mammogram status: Completed 09/2022. Repeat every year  Bone Density status: Completed 09/2022. Results reflect: Bone density results: NORMAL. Repeat every 2 years.  Lung Cancer Screening: (Low Dose CT Chest recommended if Age 74-80 years, 20 pack-year currently smoking OR have quit w/in 15years.) does not qualify.     Additional Screening:  Hepatitis C Screening: does qualify; Completed  12/2015  Vision Screening: Recommended annual ophthalmology exams for early detection of glaucoma and other disorders of the eye. Is the patient up to date with their annual eye exam?  Yes  Who is the provider or what is the name of the office in which the patient attends annual eye exams? [facility] [place] Patient states that she is up to date with diabetic eye exam . Will request records If pt is not established with a provider, would they like to be referred to a provider to establish care? No .   Dental Screening: Recommended annual dental exams for proper oral hygiene  Diabetic Foot Exam: Diabetic Foot Exam: Completed 10/2022  Community Resource Referral / Chronic Care Management: CRR  required this visit?  No   CCM required this visit?  No     Plan:     I have personally reviewed and noted the following in the patient's chart:   Medical and social history Use of alcohol, tobacco or illicit drugs  Current medications and supplements including opioid prescriptions. Patient is not currently taking opioid prescriptions. Functional ability and status Nutritional status Physical activity Advanced directives List of other physicians Hospitalizations, surgeries, and ER visits in previous 12 months Vitals Screenings to include cognitive, depression, and falls Referrals and appointments  In addition, I have reviewed and discussed with patient certain preventive protocols, quality metrics, and best practice recommendations. A written personalized care plan for preventive services as well as general preventive health recommendations were provided to patient.     Sydell Axon, LPN   [id number]   After Visit Summary: (MyChart) Due to this being a telephonic visit, the after visit summary with patients personalized plan was offered to patient via MyChart   Nurse Notes: None

## 2022-10-24 NOTE — Patient Instructions (Signed)
Jennifer Huff , Thank you for taking time to come for your Medicare Wellness Visit. I appreciate your ongoing commitment to your health goals. Please review the following plan we discussed and let me know if I can assist you in the future.   Referrals/Orders/Follow-Ups/Clinician Recommendations: Remember to update your vaccines  This is a list of the screening recommended for you and due dates:  Health Maintenance  Topic Date Due   DTaP/Tdap/Td vaccine (2 - Td or Tdap) 06/08/2021   Eye exam for diabetics  07/22/2022   COVID-19 Vaccine (6 - 2023-24 season) 09/03/2022   Hemoglobin A1C  04/19/2023   Mammogram  09/12/2023   Yearly kidney function blood test for diabetes  10/19/2023   Yearly kidney health urinalysis for diabetes  10/19/2023   Complete foot exam   10/19/2023   Medicare Annual Wellness Visit  10/24/2023   Colon Cancer Screening  05/05/2024   DEXA scan (bone density measurement)  09/11/2024   Pneumonia Vaccine  Completed   Flu Shot  Completed   Hepatitis C Screening  Completed   Zoster (Shingles) Vaccine  Completed   HPV Vaccine  Aged Out    Advanced directives: (Declined) Advance directive discussed with you today. Even though you declined this today, please call our office should you change your mind, and we can give you the proper paperwork for you to fill out.  Next Medicare Annual Wellness Visit scheduled for next year: Yes 10/29/23 @ 8:55

## 2022-11-24 DIAGNOSIS — H3092 Unspecified chorioretinal inflammation, left eye: Secondary | ICD-10-CM | POA: Diagnosis not present

## 2022-12-03 ENCOUNTER — Other Ambulatory Visit: Payer: Self-pay | Admitting: Family

## 2022-12-03 DIAGNOSIS — K219 Gastro-esophageal reflux disease without esophagitis: Secondary | ICD-10-CM

## 2022-12-17 ENCOUNTER — Other Ambulatory Visit: Payer: Self-pay | Admitting: Family

## 2022-12-17 DIAGNOSIS — E118 Type 2 diabetes mellitus with unspecified complications: Secondary | ICD-10-CM

## 2022-12-20 ENCOUNTER — Telehealth: Payer: Self-pay

## 2022-12-20 NOTE — Telephone Encounter (Signed)
I left a message with patient's husband, Sreeya Rossiter, asking him to please have patient call us to reschedule her appointment on 04/20/2023 with Rennie Plowman, FNP, as this is Good Friday and our office will be closed.Marland Kitchen

## 2023-01-15 ENCOUNTER — Other Ambulatory Visit: Payer: Self-pay | Admitting: Family

## 2023-01-15 DIAGNOSIS — I10 Essential (primary) hypertension: Secondary | ICD-10-CM

## 2023-01-23 DIAGNOSIS — N1831 Chronic kidney disease, stage 3a: Secondary | ICD-10-CM | POA: Diagnosis not present

## 2023-01-29 DIAGNOSIS — I129 Hypertensive chronic kidney disease with stage 1 through stage 4 chronic kidney disease, or unspecified chronic kidney disease: Secondary | ICD-10-CM | POA: Diagnosis not present

## 2023-01-29 DIAGNOSIS — E1129 Type 2 diabetes mellitus with other diabetic kidney complication: Secondary | ICD-10-CM | POA: Diagnosis not present

## 2023-01-29 DIAGNOSIS — N1832 Chronic kidney disease, stage 3b: Secondary | ICD-10-CM | POA: Diagnosis not present

## 2023-01-29 DIAGNOSIS — E278 Other specified disorders of adrenal gland: Secondary | ICD-10-CM | POA: Diagnosis not present

## 2023-01-29 DIAGNOSIS — E1122 Type 2 diabetes mellitus with diabetic chronic kidney disease: Secondary | ICD-10-CM | POA: Diagnosis not present

## 2023-01-29 DIAGNOSIS — K219 Gastro-esophageal reflux disease without esophagitis: Secondary | ICD-10-CM | POA: Diagnosis not present

## 2023-01-29 DIAGNOSIS — N281 Cyst of kidney, acquired: Secondary | ICD-10-CM | POA: Diagnosis not present

## 2023-01-29 DIAGNOSIS — R809 Proteinuria, unspecified: Secondary | ICD-10-CM | POA: Diagnosis not present

## 2023-02-26 DIAGNOSIS — S79911A Unspecified injury of right hip, initial encounter: Secondary | ICD-10-CM | POA: Diagnosis not present

## 2023-02-26 DIAGNOSIS — S8992XA Unspecified injury of left lower leg, initial encounter: Secondary | ICD-10-CM | POA: Diagnosis not present

## 2023-02-28 DIAGNOSIS — E785 Hyperlipidemia, unspecified: Secondary | ICD-10-CM | POA: Diagnosis not present

## 2023-02-28 DIAGNOSIS — M1990 Unspecified osteoarthritis, unspecified site: Secondary | ICD-10-CM | POA: Diagnosis not present

## 2023-02-28 DIAGNOSIS — N1831 Chronic kidney disease, stage 3a: Secondary | ICD-10-CM | POA: Diagnosis not present

## 2023-02-28 DIAGNOSIS — E1122 Type 2 diabetes mellitus with diabetic chronic kidney disease: Secondary | ICD-10-CM | POA: Diagnosis not present

## 2023-02-28 DIAGNOSIS — Z6841 Body Mass Index (BMI) 40.0 and over, adult: Secondary | ICD-10-CM | POA: Diagnosis not present

## 2023-02-28 DIAGNOSIS — K219 Gastro-esophageal reflux disease without esophagitis: Secondary | ICD-10-CM | POA: Diagnosis not present

## 2023-02-28 DIAGNOSIS — Z008 Encounter for other general examination: Secondary | ICD-10-CM | POA: Diagnosis not present

## 2023-02-28 DIAGNOSIS — I129 Hypertensive chronic kidney disease with stage 1 through stage 4 chronic kidney disease, or unspecified chronic kidney disease: Secondary | ICD-10-CM | POA: Diagnosis not present

## 2023-02-28 DIAGNOSIS — E1169 Type 2 diabetes mellitus with other specified complication: Secondary | ICD-10-CM | POA: Diagnosis not present

## 2023-02-28 DIAGNOSIS — F17211 Nicotine dependence, cigarettes, in remission: Secondary | ICD-10-CM | POA: Diagnosis not present

## 2023-03-15 ENCOUNTER — Other Ambulatory Visit: Payer: Self-pay | Admitting: Family

## 2023-03-15 DIAGNOSIS — E118 Type 2 diabetes mellitus with unspecified complications: Secondary | ICD-10-CM

## 2023-03-19 ENCOUNTER — Other Ambulatory Visit: Payer: Self-pay | Admitting: Family

## 2023-03-19 DIAGNOSIS — K219 Gastro-esophageal reflux disease without esophagitis: Secondary | ICD-10-CM

## 2023-03-21 DIAGNOSIS — H3092 Unspecified chorioretinal inflammation, left eye: Secondary | ICD-10-CM | POA: Diagnosis not present

## 2023-03-22 ENCOUNTER — Other Ambulatory Visit: Payer: Self-pay | Admitting: Family

## 2023-03-22 ENCOUNTER — Telehealth: Payer: Self-pay

## 2023-03-22 DIAGNOSIS — K219 Gastro-esophageal reflux disease without esophagitis: Secondary | ICD-10-CM

## 2023-03-22 NOTE — Telephone Encounter (Signed)
 CrtCl 63

## 2023-03-22 NOTE — Telephone Encounter (Signed)
 Copied from CRM [id number]. Topic: Clinical - Medical Advice >> Mar 22, 2023  2:37 PM Adaysia C wrote: Reason for CRM: Patient called back to speak with [facility]; Please follow up with patient when available [phone]

## 2023-03-22 NOTE — Telephone Encounter (Signed)
 LVM to call back to inform pt of the following   She has h/o renal disease. Does she need pepcid 20mg  TWICE daily or would once daily manage her reflux?  If agrees to lower dose , please send in pepcid 20mg  at bedtime.  Sch appt for DM follow up as well

## 2023-03-23 NOTE — Telephone Encounter (Signed)
 See previous note spoke to pt in regards to medication

## 2023-03-23 NOTE — Telephone Encounter (Signed)
 Spoke to pt and  informed her that Jennifer Huff would like her to try the Pepcid once a day instead of twice a day because of her Kidney function. Pt verbalized understanding and will try the once a day and let us know if it is working or not

## 2023-04-03 ENCOUNTER — Telehealth: Payer: Self-pay

## 2023-04-03 NOTE — Telephone Encounter (Signed)
 LVM to call back to inform pt of medical coverage that is partially approved. Will explain when pt calls back

## 2023-04-04 ENCOUNTER — Telehealth: Payer: Self-pay

## 2023-04-04 DIAGNOSIS — H3092 Unspecified chorioretinal inflammation, left eye: Secondary | ICD-10-CM | POA: Diagnosis not present

## 2023-04-04 NOTE — Telephone Encounter (Signed)
 Spoke to pt in regards to partial  medical coverage fax we received  pt verbalized understanding  pt wanted me to hold on to fax and give to her at her next visit. Placed in my folder above my desk until she comes in

## 2023-04-20 ENCOUNTER — Ambulatory Visit: Payer: Medicare PPO | Admitting: Family

## 2023-04-23 ENCOUNTER — Ambulatory Visit (INDEPENDENT_AMBULATORY_CARE_PROVIDER_SITE_OTHER): Payer: Medicare PPO | Admitting: Family

## 2023-04-23 ENCOUNTER — Encounter: Payer: Self-pay | Admitting: Family

## 2023-04-23 VITALS — BP 128/78 | HR 68 | Temp 97.9°F | Ht 66.0 in | Wt 242.0 lb

## 2023-04-23 DIAGNOSIS — M25562 Pain in left knee: Secondary | ICD-10-CM | POA: Insufficient documentation

## 2023-04-23 DIAGNOSIS — I10 Essential (primary) hypertension: Secondary | ICD-10-CM

## 2023-04-23 DIAGNOSIS — G8929 Other chronic pain: Secondary | ICD-10-CM

## 2023-04-23 DIAGNOSIS — M25551 Pain in right hip: Secondary | ICD-10-CM

## 2023-04-23 DIAGNOSIS — K219 Gastro-esophageal reflux disease without esophagitis: Secondary | ICD-10-CM

## 2023-04-23 DIAGNOSIS — R7309 Other abnormal glucose: Secondary | ICD-10-CM

## 2023-04-23 DIAGNOSIS — E118 Type 2 diabetes mellitus with unspecified complications: Secondary | ICD-10-CM

## 2023-04-23 LAB — POCT GLYCOSYLATED HEMOGLOBIN (HGB A1C): Hemoglobin A1C: 5.9 % — AB (ref 4.0–5.6)

## 2023-04-23 MED ORDER — FAMOTIDINE 20 MG PO TABS: 20.0000 mg | ORAL_TABLET | Freq: Every day | ORAL | 0 refills | Status: DC

## 2023-04-23 MED ORDER — METOPROLOL TARTRATE 25 MG PO TABS: 25.0000 mg | ORAL_TABLET | Freq: Two times a day (BID) | ORAL | 1 refills | Status: DC

## 2023-04-23 MED ORDER — METFORMIN HCL ER 500 MG PO TB24: 500.0000 mg | ORAL_TABLET | Freq: Two times a day (BID) | ORAL | 0 refills | Status: DC

## 2023-04-23 NOTE — Progress Notes (Signed)
 Assessment & Plan:  Elevated glucose -     POCT glycosylated hemoglobin (Hb A1C)  Gastroesophageal reflux disease, unspecified whether esophagitis present -     Famotidine ; Take 1 tablet (20 mg total) by mouth daily.  Dispense: 90 tablet; Refill: 0  Essential hypertension -     Metoprolol  Tartrate; Take 1 tablet (25 mg total) by mouth 2 (two) times daily.  Dispense: 180 tablet; Refill: 1  Controlled type 2 diabetes mellitus with complication, without long-term current use of insulin (HCC) Assessment & Plan:  Excellent control.  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  She continues to follow with Dr. Yvonnie Heritage, at [facility] kidney.  Continue metformin  500 mg twice daily , Farxiga 10 mg,losartan  to 100 mg for renal protection.   Orders: -     metFORMIN  HCl ER; Take 1 tablet (500 mg total) by mouth 2 (two) times daily with a meal.  Dispense: 180 tablet; Refill: 0  Chronic pain of left knee Assessment & Plan: Presentation consistent with degenerative changes left knee.  Reviewed recent  right hip and left knee x-ray . discussed conservative management including ice, use of Tylenol  arthritis.  Exercises printed on after visit summary.  Referral to orthopedics  Orders: -     Ambulatory referral to Orthopedic Surgery  Right hip pain Assessment & Plan: Presents consistent with trochanteric bursitis. discussed conservative management including ice, use of Tylenol  arthritis.  Exercises printed on after visit summary.  Referral to orthopedics   Primary hypertension Assessment & Plan: Chronic, stable.  Continue metoprolol  25 mg twice daily ,losartan  100mg  once daily, amlodipine  10 mg qd      Return precautions given.   Risks, benefits, and alternatives of the medications and treatment plan prescribed today were discussed, and patient expressed understanding.   Education regarding symptom management and diagnosis given to patient on AVS either electronically or printed.  Return in  about 3 months (around 07/23/2023).  Bascom Bossier, FNP  Subjective:    Patient ID: Jennifer Huff, female    DOB: 1950/10/02, 73 y.o.   MRN: [id number]  CC: Jennifer Huff is a 73 y.o. female who presents today for follow up.   HPI: Complains of episodic left anterior knee pain and right lateral hip pain x 2 months  Knee pain is worse at night. Pain when sleeping on right hip. Left leg pain radiates to left calf. Knee feels like it may give out.   Onset after doing 'leg presses at gym' which was a new exercise. No popping sound.  No fever, sob, leg swelling, groin pain, numbness, saddle anesthesia, low back pain.   She has tried tylenol  500mg  as needed with minimal relief    H/o right knee surgery s/p meniscal tear  She continues to follow with Dr Yvonnie Heritage, nephrology.   [facility] 02/26/23 Right hip Xray  Joint spaces are maintained.  There is no evidence of fracture,  dislocation or malalignment.  No erosive changes or degenerative changes.   Soft tissues are unremarkable.    Left knee xray Joint spaces are maintained.  There is no evidence of fracture,  dislocation or malalignment.  Soft tissues are unremarkable.  No erosive  changes.  Mild tricompartmental peripheral hypertrophic spurring  Allergies: Ace inhibitors and Codeine Current Outpatient Medications on File Prior to Visit  Medication Sig Dispense Refill   acetaminophen  (TYLENOL ) 500 MG tablet Take 1,000 mg by mouth daily as needed for mild pain.     amLODipine  (NORVASC ) 10 MG tablet Take  1 tablet (10 mg total) by mouth daily. 90 tablet 3   Cholecalciferol (VITAMIN D3) 2000 units TABS Take 2,000 Units by mouth at bedtime.      dapagliflozin propanediol (FARXIGA) 10 MG TABS tablet Take 10 mg by mouth daily.     docusate sodium  (COLACE) 100 MG capsule Take 1 capsule (100 mg total) by mouth daily. 90 capsule 0   ketorolac  (ACULAR ) 0.5 % ophthalmic solution Place 1 drop into the left eye 2 (two) times  daily.     losartan  (COZAAR ) 100 MG tablet Take 1 tablet (100 mg total) by mouth daily. 90 tablet 3   polyethylene glycol powder (GLYCOLAX/MIRALAX) 17 GM/SCOOP powder Take 1 Container by mouth as needed. Take one capful mix into water as needed for constipation.     pravastatin  (PRAVACHOL ) 40 MG tablet Take 1 tablet (40 mg total) by mouth daily. 90 tablet 3   Difluprednate 0.05 % EMUL Apply 1 drop to eye 4 (four) times daily. (Patient not taking: Reported on 04/23/2023)     No current facility-administered medications on file prior to visit.    Review of Systems  Constitutional:  Negative for chills and fever.  Respiratory:  Negative for cough and shortness of breath.   Cardiovascular:  Negative for chest pain, palpitations and leg swelling.  Gastrointestinal:  Negative for nausea and vomiting.  Musculoskeletal:  Positive for arthralgias (left knee pain, right hip pain).  Neurological:  Negative for numbness.      Objective:    BP 128/78   Pulse 68   Temp 97.9 F (36.6 C) (Oral)   Ht 5\' 6"  (1.676 m)   Wt 242 lb (109.8 kg)   SpO2 98%   BMI 39.06 kg/m  BP Readings from Last 3 Encounters:  04/23/23 128/78  10/19/22 128/78  04/14/22 128/74   Wt Readings from Last 3 Encounters:  04/23/23 242 lb (109.8 kg)  10/24/22 244 lb (110.7 kg)  10/19/22 244 lb 3.2 oz (110.8 kg)    Physical Exam Vitals reviewed.  Constitutional:      Appearance: She is well-developed.  Eyes:     Conjunctiva/sclera: Conjunctivae normal.  Cardiovascular:     Rate and Rhythm: Normal rate and regular rhythm.     Pulses: Normal pulses.     Heart sounds: Normal heart sounds.  Pulmonary:     Effort: Pulmonary effort is normal.     Breath sounds: Normal breath sounds. No wheezing, rhonchi or rales.  Musculoskeletal:     Lumbar back: No swelling, edema, spasms, tenderness or bony tenderness. Normal range of motion.     Right knee: No swelling or bony tenderness. Normal range of motion. No tenderness.      Left knee: No swelling or bony tenderness. Normal range of motion. No tenderness.     Right lower leg: No edema.     Left lower leg: No edema.     Comments: Full range of motion with flexion, tension, lateral side bends. No bony tenderness. No pain, numbness, tingling elicited with single leg raise bilaterally.   Bilateral knees are symmetric. No effusion appreciated. No increase in warmth or erythema. Crepitus felt with flexion of bilateral knees.  Left knee:  Able to extend to -5 to 10 degrees and flex to 110 degrees. No catching with McMurray maneuver.  No calf tenderness of lower leg edema bilaterally.    Skin:    General: Skin is warm and dry.  Neurological:     Mental Status: She is alert.  Sensory: No sensory deficit.     Deep Tendon Reflexes:     Reflex Scores:      Patellar reflexes are 2+ on the right side and 2+ on the left side.    Comments: Sensation and strength intact bilateral lower extremities.  Psychiatric:        Speech: Speech normal.        Behavior: Behavior normal.        Thought Content: Thought content normal.

## 2023-04-23 NOTE — Patient Instructions (Signed)
 Hip Bursitis Rehab Ask your health care provider which exercises are safe for you. Do exercises exactly as told by your health care provider and adjust them as directed. It is normal to feel mild stretching, pulling, tightness, or discomfort as you do these exercises. Stop right away if you feel sudden pain or your pain gets worse. Do not begin these exercises until told by your health care provider. Stretching exercise This exercise warms up your muscles and joints and improves the movement and flexibility of your hip. This exercise also helps to relieve pain and stiffness. Iliotibial band stretch An iliotibial band is a strong band of muscle tissue that runs from the outer side of your hip to the outer side of your thigh and knee. Lie on your side with your left / right leg in the top position. Bend your left / right knee and grab your ankle. Stretch out your bottom arm to help you balance. Slowly bring your knee back so your thigh is slightly behind your body. Slowly lower your knee toward the floor until you feel a gentle stretch on the outside of your left / right thigh. If you do not feel a stretch and your knee will not lower more toward the floor, place the heel of your other foot on top of your knee and pull your knee down toward the floor with your foot. Hold this position for __________ seconds. Slowly return to the starting position. Repeat __________ times. Complete this exercise __________ times a day. Strengthening exercises These exercises build strength and endurance in your hip and pelvis. Endurance is the ability to use your muscles for a long time, even after they get tired. Bridge This exercise strengthens the muscles that move your thigh backward (hip extensors). Lie on your back on a firm surface with your knees bent and your feet flat on the floor. Tighten your buttocks muscles and lift your buttocks off the floor until your trunk is level with your thighs. Do not arch your  back. You should feel the muscles working in your buttocks and the back of your thighs. If you do not feel these muscles, slide your feet 1-2 inches (2.5-5 cm) farther away from your buttocks. If this exercise is too easy, try doing it with your arms crossed over your chest. Hold this position for __________ seconds. Slowly lower your hips to the starting position. Let your muscles relax completely after each repetition. Repeat __________ times. Complete this exercise __________ times a day. Squats This exercise strengthens the muscles in front of your thigh and knee (quadriceps). Stand in front of a table, with your feet and knees pointing straight ahead. You may rest your hands on the table for balance but not for support. Slowly bend your knees and lower your hips like you are going to sit in a chair. Keep your weight over your heels, not over your toes. Keep your lower legs upright so they are parallel with the table legs. Do not let your hips go lower than your knees. Do not bend lower than told by your health care provider. If your hip pain increases, do not bend as low. Hold the squat position for __________ seconds. Slowly push with your legs to return to standing. Do not use your hands to pull yourself to standing. Repeat __________ times. Complete this exercise __________ times a day. Hip hike  Stand sideways on a bottom step. Stand on your left / right leg with your other foot unsupported next to  the step. You can hold on to the railing or wall for balance if needed. Keep your knees straight and your torso square. Then lift your left / right hip up toward the ceiling. Hold this position for __________ seconds. Slowly let your left / right hip lower toward the floor, past the starting position. Your foot should get closer to the floor. Do not lean or bend your knees. Repeat __________ times. Complete this exercise __________ times a day. Single leg stand This exercise increases  your balance. Without shoes, stand near a railing or in a doorway. You may hold on to the railing or door frame as needed for balance. Squeeze your left / right buttock muscles, then lift up your other foot. Do not let your left / right hip push out to the side. It is helpful to stand in front of a mirror for this exercise so you can watch your hip. Hold this position for __________ seconds. Repeat __________ times. Complete this exercise __________ times a day. This information is not intended to replace advice given to you by your health care provider. Make sure you discuss any questions you have with your health care provider. Document Revised: 12/01/2020 Document Reviewed: 12/01/2020 Elsevier Patient Education  2024 [employer]Exercises for Chronic Knee Pain Chronic knee pain is pain that lasts longer than 3 months. For most people with chronic knee pain, exercise and weight loss is an important part of treatment. Your health care provider may want you to focus on: Making the muscles that support your knee stronger. This can take pressure off your knee and reduce pain. Preventing knee stiffness. How far you can move your knee, keeping it there or making it farther. Losing weight (if this applies) to take pressure off your knee, lower your risk for injury, and make it easier for you to exercise. Your provider will help you make an exercise program that fits your needs and physical abilities. Below are simple, low-impact exercises you can do at home. Ask your provider or physical therapist how often you should do your exercise program and how many times to repeat each exercise. General safety tips  Get your provider's approval before doing any exercises. Start slowly and stop any time you feel pain. Do not exercise if your knee pain is flaring up. Warm up first. Stretching a cold muscle can cause an injury. Do 5-10 minutes of easy movement or light stretching before beginning your  exercises. Do 5-10 minutes of low-impact activity (like walking or cycling) before starting strengthening exercises. Contact your provider any time you have pain during or after exercising. Exercise can cause discomfort but should not be painful. It is normal to be a little stiff or sore after exercising. Stretching and range-of-motion exercises Front thigh stretch  Stand up straight and support your body by holding on to a chair or resting one hand on a wall. With your legs straight and close together, bend one knee to lift your heel up toward your butt. Using one hand for support, grab your ankle with your free hand. Pull your foot up closer toward your butt to feel the stretch in front of your thigh. Hold the stretch for 30 seconds. Repeat __________ times. Complete this exercise __________ times a day. Back thigh stretch  Sit on the floor with your back straight and your legs out straight in front of you. Place the palms of your hands on the floor and slide them toward your feet as you bend at the  hip. Try to touch your nose to your knees and feel the stretch in the back of your thighs. Hold for 30 seconds. Repeat __________ times. Complete this exercise __________ times a day. Calf stretch  Stand facing a wall. Place the palms of your hands flat against the wall, arms extended, and lean slightly against the wall. Get into a lunge position with one leg bent at the knee and the other leg stretched out straight behind you. Keep both feet facing the wall and increase the bend in your knee while keeping the heel of the other leg flat on the ground. You should feel the stretch in your calf. Hold for 30 seconds. Repeat __________ times. Complete this exercise __________ times a day. Strengthening exercises Straight leg lift  Lie on your back with one knee bent and the other leg out straight. Slowly lift the straight leg without bending the knee. Lift until your foot is about 12 inches  (30 cm) off the floor. Hold for 3-5 seconds and slowly lower your leg. Repeat __________ times. Complete this exercise __________ times a day. Single leg dip  Stand between two chairs and put both hands on the backs of the chairs for support. Extend one leg out straight with your body weight resting on the heel of the standing leg. Slowly bend your standing knee to dip your body to the level that is comfortable for you. Hold for 3-5 seconds. Repeat __________ times. Complete this exercise __________ times a day. Hamstring curls  Stand straight, knees close together, facing the back of a chair. Hold on to the back of a chair with both hands. Keep one leg straight. Bend the other knee while bringing the heel up toward the butt until the knee is bent at a 90-degree angle (right angle). Hold for 3-5 seconds. Repeat __________ times. Complete this exercise __________ times a day. Wall squat  Stand straight with your back, hips, and head against a wall. Step forward one foot at a time with your back still against the wall. Your feet should be 2 feet (61 cm) from the wall at shoulder width. Keeping your back, hips, and head against the wall, slide down the wall to as close to a sitting position as you can get. Hold for 5-10 seconds, then slowly slide back up. Repeat __________ times. Complete this exercise __________ times a day. Step-ups  Stand in front of a sturdy platform or stool that is about 6 inches (15 cm) high. Slowly step up with your left / right foot, keeping your knee in line with your hip and foot. Do not let your knee bend so far that you cannot see your toes. Hold on to a chair for balance, but do not use it for support. Slowly unlock your knee and lower yourself to the starting position. Repeat __________ times. Complete this exercise __________ times a day. Contact a health care provider if: Your exercises cause pain. Your pain is worse after you exercise. Your pain  prevents you from doing your exercises. This information is not intended to replace advice given to you by your health care provider. Make sure you discuss any questions you have with your health care provider. Document Revised: 01/03/2022 Document Reviewed: 01/03/2022 Elsevier Patient Education  2024 [employer].

## 2023-04-25 ENCOUNTER — Encounter: Payer: Self-pay | Admitting: Family

## 2023-04-26 DIAGNOSIS — M25551 Pain in right hip: Secondary | ICD-10-CM | POA: Insufficient documentation

## 2023-04-26 NOTE — Assessment & Plan Note (Addendum)
 Presentation consistent with degenerative changes left knee.  Reviewed recent  right hip and left knee x-ray . discussed conservative management including ice, use of Tylenol  arthritis.  Exercises printed on after visit summary.  Referral to orthopedics

## 2023-04-26 NOTE — Assessment & Plan Note (Signed)
  Excellent control.  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  She continues to follow with Dr. Yvonnie Heritage, at [facility] kidney.  Continue metformin  500 mg twice daily , Farxiga 10 mg,losartan  to 100 mg for renal protection.

## 2023-04-26 NOTE — Assessment & Plan Note (Signed)
 Presents consistent with trochanteric bursitis. discussed conservative management including ice, use of Tylenol  arthritis.  Exercises printed on after visit summary.  Referral to orthopedics

## 2023-04-26 NOTE — Assessment & Plan Note (Signed)
Chronic, stable.  Continue metoprolol 25 mg twice daily ,losartan 100mg  once daily, amlodipine 10 mg qd

## 2023-05-06 ENCOUNTER — Telehealth: Payer: Self-pay | Admitting: Family

## 2023-05-06 DIAGNOSIS — R809 Proteinuria, unspecified: Secondary | ICD-10-CM

## 2023-05-06 NOTE — Telephone Encounter (Signed)
 Call pt Reviewing chart and I would recommend repeat urine protein test as last obtained 10/24  The calculation in the [facility] software had been incorrect. Previously her urine protein had been normal however I would recommend repeating to monitor  Order and sch urine protein in the next couple of weeks

## 2023-05-07 NOTE — Telephone Encounter (Signed)
 Scheduled pt for repeat urine orders already in

## 2023-05-10 ENCOUNTER — Other Ambulatory Visit

## 2023-05-10 ENCOUNTER — Encounter: Payer: Self-pay | Admitting: Family

## 2023-05-10 DIAGNOSIS — R809 Proteinuria, unspecified: Secondary | ICD-10-CM

## 2023-05-10 DIAGNOSIS — H903 Sensorineural hearing loss, bilateral: Secondary | ICD-10-CM | POA: Diagnosis not present

## 2023-05-10 LAB — MICROALBUMIN / CREATININE URINE RATIO
Creatinine,U: 116.3 mg/dL
Microalb Creat Ratio: 147.9 mg/g — ABNORMAL HIGH (ref 0.0–30.0)
Microalb, Ur: 17.2 mg/dL — ABNORMAL HIGH (ref 0.0–1.9)

## 2023-05-15 ENCOUNTER — Ambulatory Visit: Payer: Self-pay

## 2023-06-11 DIAGNOSIS — M25562 Pain in left knee: Secondary | ICD-10-CM | POA: Diagnosis not present

## 2023-06-11 DIAGNOSIS — S83272A Complex tear of lateral meniscus, current injury, left knee, initial encounter: Secondary | ICD-10-CM | POA: Diagnosis not present

## 2023-06-12 ENCOUNTER — Other Ambulatory Visit: Payer: Self-pay | Admitting: Surgery

## 2023-06-12 ENCOUNTER — Other Ambulatory Visit: Payer: Self-pay | Admitting: Family

## 2023-06-12 DIAGNOSIS — S83272A Complex tear of lateral meniscus, current injury, left knee, initial encounter: Secondary | ICD-10-CM

## 2023-06-12 DIAGNOSIS — E118 Type 2 diabetes mellitus with unspecified complications: Secondary | ICD-10-CM

## 2023-06-12 DIAGNOSIS — M25562 Pain in left knee: Secondary | ICD-10-CM

## 2023-06-16 ENCOUNTER — Ambulatory Visit
Admission: RE | Admit: 2023-06-16 | Discharge: 2023-06-16 | Disposition: A | Discharge status: Home / Self Care | Source: Ambulatory Visit | Attending: Surgery

## 2023-06-16 DIAGNOSIS — S83272A Complex tear of lateral meniscus, current injury, left knee, initial encounter: Secondary | ICD-10-CM | POA: Diagnosis present

## 2023-06-16 DIAGNOSIS — M25462 Effusion, left knee: Secondary | ICD-10-CM | POA: Diagnosis not present

## 2023-06-16 DIAGNOSIS — M25562 Pain in left knee: Secondary | ICD-10-CM | POA: Diagnosis not present

## 2023-06-16 DIAGNOSIS — M94262 Chondromalacia, left knee: Secondary | ICD-10-CM | POA: Diagnosis not present

## 2023-06-18 ENCOUNTER — Other Ambulatory Visit: Payer: Self-pay | Admitting: Family

## 2023-06-18 DIAGNOSIS — K219 Gastro-esophageal reflux disease without esophagitis: Secondary | ICD-10-CM

## 2023-07-19 ENCOUNTER — Other Ambulatory Visit: Payer: Self-pay | Admitting: Family

## 2023-07-19 DIAGNOSIS — K219 Gastro-esophageal reflux disease without esophagitis: Secondary | ICD-10-CM

## 2023-07-20 DIAGNOSIS — M1712 Unilateral primary osteoarthritis, left knee: Secondary | ICD-10-CM | POA: Diagnosis not present

## 2023-07-23 ENCOUNTER — Ambulatory Visit: Admitting: Family

## 2023-07-23 VITALS — BP 126/80 | HR 84 | Temp 97.7°F | Ht 66.0 in | Wt 241.4 lb

## 2023-07-23 DIAGNOSIS — R7309 Other abnormal glucose: Secondary | ICD-10-CM

## 2023-07-23 DIAGNOSIS — I10 Essential (primary) hypertension: Secondary | ICD-10-CM

## 2023-07-23 DIAGNOSIS — Z1231 Encounter for screening mammogram for malignant neoplasm of breast: Secondary | ICD-10-CM

## 2023-07-23 DIAGNOSIS — Z7984 Long term (current) use of oral hypoglycemic drugs: Secondary | ICD-10-CM | POA: Diagnosis not present

## 2023-07-23 DIAGNOSIS — E785 Hyperlipidemia, unspecified: Secondary | ICD-10-CM

## 2023-07-23 DIAGNOSIS — E118 Type 2 diabetes mellitus with unspecified complications: Secondary | ICD-10-CM | POA: Diagnosis not present

## 2023-07-23 DIAGNOSIS — K219 Gastro-esophageal reflux disease without esophagitis: Secondary | ICD-10-CM

## 2023-07-23 DIAGNOSIS — N1832 Chronic kidney disease, stage 3b: Secondary | ICD-10-CM | POA: Diagnosis not present

## 2023-07-23 LAB — POCT GLYCOSYLATED HEMOGLOBIN (HGB A1C): Hemoglobin A1C: 5.9 % — AB (ref 4.0–5.6)

## 2023-07-23 MED ORDER — FAMOTIDINE 20 MG PO TABS
20.0000 mg | ORAL_TABLET | Freq: Every day | ORAL | 1 refills | Status: AC
Start: 2023-07-23 — End: ?

## 2023-07-23 MED ORDER — METFORMIN HCL ER 500 MG PO TB24: 500.0000 mg | ORAL_TABLET | Freq: Two times a day (BID) | ORAL | 3 refills | Status: DC

## 2023-07-23 MED ORDER — PRAVASTATIN SODIUM 40 MG PO TABS: 40.0000 mg | ORAL_TABLET | Freq: Every day | ORAL | 3 refills | Status: AC

## 2023-07-23 MED ORDER — METOPROLOL TARTRATE 25 MG PO TABS
25.0000 mg | ORAL_TABLET | Freq: Two times a day (BID) | ORAL | 1 refills | Status: AC
Start: 2023-07-23 — End: ?

## 2023-07-23 NOTE — Patient Instructions (Signed)
Please let me know if vaginal bleeding persists   please call  and schedule your 3D mammogram and /or bone density scan as we discussed.   [facility]  ( new location in 2023)  [address], [place], [place] [address]  [place], [place]  [phone]   I have ordered transvaginal ultrasound.  Let us know if you dont hear back within a week in regards to an appointment being scheduled.   So that you are aware, if you are [facility] MyChart user , please pay attention to your MyChart messages as you may receive a MyChart message with a phone number to call and schedule this test/appointment own your own from our referral coordinator. This is a new process so I do not want you to miss this message.  If you are not a MyChart user, you will receive a phone call.   

## 2023-07-23 NOTE — Assessment & Plan Note (Signed)
Chronic, stable.  Continue metoprolol 25 mg twice daily ,losartan 100mg  once daily, amlodipine 10 mg qd

## 2023-07-23 NOTE — Progress Notes (Signed)
 Assessment & Plan:  Controlled type 2 diabetes mellitus with complication, without long-term current use of insulin (HCC) Assessment & Plan:  Excellent control.  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  She continues to follow with Dr. Jerrye, at [facility] kidney, whom she sees next week; deferred labs today as patient has labs scheduled this week.  Continue metformin  500 mg twice daily , Farxiga 10 mg,losartan  to 100 mg for renal protection.   Orders: -     metFORMIN  HCl ER; Take 1 tablet (500 mg total) by mouth 2 (two) times daily with a meal.  Dispense: 180 tablet; Refill: 3  Elevated glucose -     POCT glycosylated hemoglobin (Hb A1C)  Gastroesophageal reflux disease, unspecified whether esophagitis present -     Famotidine ; Take 1 tablet (20 mg total) by mouth daily.  Dispense: 90 tablet; Refill: 1  Essential hypertension -     Metoprolol  Tartrate; Take 1 tablet (25 mg total) by mouth 2 (two) times daily.  Dispense: 180 tablet; Refill: 1  Hyperlipidemia, unspecified hyperlipidemia type -     Pravastatin  Sodium; Take 1 tablet (40 mg total) by mouth daily.  Dispense: 90 tablet; Refill: 3  Encounter for screening mammogram for malignant neoplasm of breast -     3D Screening Mammogram, Left and Right; Future  Primary hypertension Assessment & Plan: Chronic, stable.  Continue metoprolol  25 mg twice daily ,losartan  100mg  once daily, amlodipine  10 mg qd      Return precautions given.   Risks, benefits, and alternatives of the medications and treatment plan prescribed today were discussed, and patient expressed understanding.   Education regarding symptom management and diagnosis given to patient on AVS either electronically or printed.  Return in about 4 months (around 11/23/2023).  Jennifer Northern, FNP  Subjective:    Patient ID: Jennifer Huff, female    DOB: Feb 15, 1950, 73 y.o.   MRN: [id number]  CC: Jennifer Huff is a 73 y.o. female who presents  today for follow up.   HPI: Feels well today  No new complaints  She is compliant with meformin and tolerating medication well.     Mammogram is up-to-date.  She continues to follow with nephrology, labs scheduled this week.   Allergies: Ace inhibitors and Codeine Current Outpatient Medications on File Prior to Visit  Medication Sig Dispense Refill   acetaminophen  (TYLENOL ) 500 MG tablet Take 1,000 mg by mouth daily as needed for mild pain.     amLODipine  (NORVASC ) 10 MG tablet Take 1 tablet (10 mg total) by mouth daily. 90 tablet 3   Cholecalciferol (VITAMIN D3) 2000 units TABS Take 2,000 Units by mouth at bedtime.      dapagliflozin propanediol (FARXIGA) 10 MG TABS tablet Take 10 mg by mouth daily.     docusate sodium  (COLACE) 100 MG capsule Take 1 capsule (100 mg total) by mouth daily. 90 capsule 0   ketorolac  (ACULAR ) 0.5 % ophthalmic solution Place 1 drop into the left eye 2 (two) times daily.     losartan  (COZAAR ) 100 MG tablet Take 1 tablet (100 mg total) by mouth daily. 90 tablet 3   polyethylene glycol powder (GLYCOLAX/MIRALAX) 17 GM/SCOOP powder Take 1 Container by mouth as needed. Take one capful mix into water as needed for constipation.     No current facility-administered medications on file prior to visit.    Review of Systems  Constitutional:  Negative for chills and fever.  Respiratory:  Negative for cough.   Cardiovascular:  Negative  for chest pain, palpitations and leg swelling.  Gastrointestinal:  Negative for nausea and vomiting.      Objective:    BP 126/80   Pulse 84   Temp 97.7 F (36.5 C) (Oral)   Ht 5' 6 (1.676 m)   Wt 241 lb 6.4 oz (109.5 kg)   SpO2 98%   BMI 38.96 kg/m  BP Readings from Last 3 Encounters:  07/23/23 126/80  04/23/23 128/78  10/19/22 128/78   Wt Readings from Last 3 Encounters:  07/23/23 241 lb 6.4 oz (109.5 kg)  04/23/23 242 lb (109.8 kg)  10/24/22 244 lb (110.7 kg)    Physical Exam Vitals reviewed.   Constitutional:      Appearance: She is well-developed.  Eyes:     Conjunctiva/sclera: Conjunctivae normal.  Cardiovascular:     Rate and Rhythm: Normal rate and regular rhythm.     Pulses: Normal pulses.     Heart sounds: Normal heart sounds.  Pulmonary:     Effort: Pulmonary effort is normal.     Breath sounds: Normal breath sounds. No wheezing, rhonchi or rales.  Musculoskeletal:     Right lower leg: No edema.     Left lower leg: No edema.  Skin:    General: Skin is warm and dry.  Neurological:     Mental Status: She is alert.  Psychiatric:        Speech: Speech normal.        Behavior: Behavior normal.        Thought Content: Thought content normal.

## 2023-07-23 NOTE — Assessment & Plan Note (Signed)
  Excellent control.  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  She continues to follow with Dr. Jerrye, at [facility] kidney, whom she sees next week; deferred labs today as patient has labs scheduled this week.  Continue metformin  500 mg twice daily , Farxiga 10 mg,losartan  to 100 mg for renal protection.

## 2023-07-31 DIAGNOSIS — I129 Hypertensive chronic kidney disease with stage 1 through stage 4 chronic kidney disease, or unspecified chronic kidney disease: Secondary | ICD-10-CM | POA: Diagnosis not present

## 2023-07-31 DIAGNOSIS — N1832 Chronic kidney disease, stage 3b: Secondary | ICD-10-CM | POA: Diagnosis not present

## 2023-07-31 DIAGNOSIS — N281 Cyst of kidney, acquired: Secondary | ICD-10-CM | POA: Diagnosis not present

## 2023-07-31 DIAGNOSIS — E1122 Type 2 diabetes mellitus with diabetic chronic kidney disease: Secondary | ICD-10-CM | POA: Diagnosis not present

## 2023-07-31 DIAGNOSIS — R809 Proteinuria, unspecified: Secondary | ICD-10-CM | POA: Diagnosis not present

## 2023-08-06 ENCOUNTER — Other Ambulatory Visit: Payer: Self-pay | Admitting: Nephrology

## 2023-08-06 DIAGNOSIS — N1832 Chronic kidney disease, stage 3b: Secondary | ICD-10-CM

## 2023-08-06 DIAGNOSIS — N281 Cyst of kidney, acquired: Secondary | ICD-10-CM

## 2023-08-07 ENCOUNTER — Ambulatory Visit
Admission: RE | Admit: 2023-08-07 | Discharge: 2023-08-07 | Disposition: A | Discharge status: Home / Self Care | Source: Ambulatory Visit | Attending: Nephrology | Admitting: Nephrology

## 2023-08-07 DIAGNOSIS — N183 Chronic kidney disease, stage 3 unspecified: Secondary | ICD-10-CM | POA: Diagnosis not present

## 2023-08-07 DIAGNOSIS — N281 Cyst of kidney, acquired: Secondary | ICD-10-CM

## 2023-08-07 DIAGNOSIS — N1832 Chronic kidney disease, stage 3b: Secondary | ICD-10-CM

## 2023-08-08 DIAGNOSIS — G8929 Other chronic pain: Secondary | ICD-10-CM | POA: Diagnosis not present

## 2023-08-08 DIAGNOSIS — M25562 Pain in left knee: Secondary | ICD-10-CM | POA: Diagnosis not present

## 2023-08-14 DIAGNOSIS — M25562 Pain in left knee: Secondary | ICD-10-CM | POA: Diagnosis not present

## 2023-08-14 DIAGNOSIS — G8929 Other chronic pain: Secondary | ICD-10-CM | POA: Diagnosis not present

## 2023-08-15 DIAGNOSIS — H35372 Puckering of macula, left eye: Secondary | ICD-10-CM | POA: Diagnosis not present

## 2023-08-15 DIAGNOSIS — H3092 Unspecified chorioretinal inflammation, left eye: Secondary | ICD-10-CM | POA: Diagnosis not present

## 2023-08-21 DIAGNOSIS — G8929 Other chronic pain: Secondary | ICD-10-CM | POA: Diagnosis not present

## 2023-08-21 DIAGNOSIS — M25562 Pain in left knee: Secondary | ICD-10-CM | POA: Diagnosis not present

## 2023-08-23 DIAGNOSIS — M25562 Pain in left knee: Secondary | ICD-10-CM | POA: Diagnosis not present

## 2023-08-23 DIAGNOSIS — G8929 Other chronic pain: Secondary | ICD-10-CM | POA: Diagnosis not present

## 2023-08-28 DIAGNOSIS — G8929 Other chronic pain: Secondary | ICD-10-CM | POA: Diagnosis not present

## 2023-08-28 DIAGNOSIS — M25562 Pain in left knee: Secondary | ICD-10-CM | POA: Diagnosis not present

## 2023-09-04 DIAGNOSIS — G8929 Other chronic pain: Secondary | ICD-10-CM | POA: Diagnosis not present

## 2023-09-04 DIAGNOSIS — M25562 Pain in left knee: Secondary | ICD-10-CM | POA: Diagnosis not present

## 2023-09-06 DIAGNOSIS — G8929 Other chronic pain: Secondary | ICD-10-CM | POA: Diagnosis not present

## 2023-09-06 DIAGNOSIS — M25562 Pain in left knee: Secondary | ICD-10-CM | POA: Diagnosis not present

## 2023-09-13 ENCOUNTER — Encounter

## 2023-09-13 ENCOUNTER — Ambulatory Visit
Admission: RE | Admit: 2023-09-13 | Discharge: 2023-09-13 | Disposition: A | Discharge status: Home / Self Care | Source: Ambulatory Visit | Attending: Family | Admitting: Family

## 2023-09-13 DIAGNOSIS — Z1231 Encounter for screening mammogram for malignant neoplasm of breast: Secondary | ICD-10-CM | POA: Insufficient documentation

## 2023-09-18 ENCOUNTER — Other Ambulatory Visit: Payer: Self-pay | Admitting: Family

## 2023-09-18 DIAGNOSIS — K219 Gastro-esophageal reflux disease without esophagitis: Secondary | ICD-10-CM

## 2023-09-26 ENCOUNTER — Encounter: Payer: Self-pay | Admitting: Family

## 2023-10-29 ENCOUNTER — Ambulatory Visit (INDEPENDENT_AMBULATORY_CARE_PROVIDER_SITE_OTHER): Payer: Medicare PPO | Admitting: *Deleted

## 2023-10-29 VITALS — Ht 66.0 in | Wt 244.0 lb

## 2023-10-29 DIAGNOSIS — Z0000 Encounter for general adult medical examination without abnormal findings: Secondary | ICD-10-CM

## 2023-10-29 NOTE — Progress Notes (Signed)
 Subjective:   Jennifer Huff is a 73 y.o. who presents for a Medicare Wellness preventive visit.  As a reminder, Annual Wellness Visits don't include a physical exam, and some assessments may be limited, especially if this visit is performed virtually. We may recommend an in-person follow-up visit with your provider if needed.  Visit Complete: Virtual I connected with  Jennifer Huff on 10/29/23 by a audio enabled telemedicine application and verified that I am speaking with the correct person using two identifiers.  Patient Location: Home  Provider Location: Home Office  I discussed the limitations of evaluation and management by telemedicine. The patient expressed understanding and agreed to proceed.  Vital Signs: Because this visit was a virtual/telehealth visit, some criteria may be missing or patient reported. Any vitals not documented were not able to be obtained and vitals that have been documented are patient reported.  VideoDeclined- This patient declined Librarian, academic. Therefore the visit was completed with audio only.  Persons Participating in Visit: Patient.  AWV Questionnaire: Yes: Patient Medicare AWV questionnaire was completed by the patient on 10/26/23; I have confirmed that all information answered by patient is correct and no changes since this date.  Cardiac Risk Factors include: advanced age (>60men, >88 women);diabetes mellitus;dyslipidemia;hypertension;obesity (BMI >30kg/m2)     Objective:    Today's Vitals   10/29/23 0851  Weight: 244 lb (110.7 kg)  Height: 5' 6 (1.676 m)   Body mass index is 39.38 kg/m.     10/29/2023    9:02 AM 10/24/2022   12:58 PM 10/19/2021    9:05 AM 03/31/2021    9:52 AM 10/14/2020    9:12 AM 10/14/2019   12:13 PM 09/25/2019   10:33 AM  Advanced Directives  Does Patient Have a Medical Advance Directive? No No No No No No No  Would patient like information on creating a  medical advance directive? No - Patient declined No - Patient declined No - Patient declined  No - Patient declined No - Patient declined     Current Medications (verified) Outpatient Encounter Medications as of 10/29/2023  Medication Sig   acetaminophen  (TYLENOL ) 500 MG tablet Take 1,000 mg by mouth daily as needed for mild pain.   amLODipine  (NORVASC ) 10 MG tablet Take 1 tablet (10 mg total) by mouth daily.   Cholecalciferol (VITAMIN D3) 2000 units TABS Take 2,000 Units by mouth at bedtime.    dapagliflozin propanediol (FARXIGA) 10 MG TABS tablet Take 10 mg by mouth daily.   docusate sodium  (COLACE) 100 MG capsule Take 1 capsule (100 mg total) by mouth daily.   famotidine  (PEPCID ) 20 MG tablet Take 1 tablet (20 mg total) by mouth daily.   ketorolac  (ACULAR ) 0.5 % ophthalmic solution Place 1 drop into the left eye 2 (two) times daily.   losartan  (COZAAR ) 100 MG tablet Take 1 tablet (100 mg total) by mouth daily.   metFORMIN  (GLUCOPHAGE -XR) 500 MG 24 hr tablet Take 1 tablet (500 mg total) by mouth 2 (two) times daily with a meal.   metoprolol  tartrate (LOPRESSOR ) 25 MG tablet Take 1 tablet (25 mg total) by mouth 2 (two) times daily.   polyethylene glycol powder (GLYCOLAX/MIRALAX) 17 GM/SCOOP powder Take 1 Container by mouth as needed. Take one capful mix into water as needed for constipation.   pravastatin  (PRAVACHOL ) 40 MG tablet Take 1 tablet (40 mg total) by mouth daily.   No facility-administered encounter medications on file as of 10/29/2023.    Allergies (  verified) Ace inhibitors and Codeine   History: Past Medical History:  Diagnosis Date   Cataract    GERD (gastroesophageal reflux disease)    HTN (hypertension)    Hypercholesteremia    Vitamin D  deficiency    Past Surgical History:  Procedure Laterality Date   COLONOSCOPY WITH PROPOFOL  N/A 05/06/2019   Procedure: COLONOSCOPY WITH PROPOFOL ;  Surgeon: Janalyn Keene NOVAK, MD;  Location: [facility] ENDOSCOPY;  Service: Endoscopy;   Laterality: N/A;   KNEE ARTHROSCOPY WITH LATERAL MENISECTOMY Right 11/06/2017   Procedure: KNEE ARTHROSCOPY WITH REPAIR LATERAL MENISCAL ROOT TEAR;  Surgeon: Edie Norleen PARAS, MD;  Location: [facility] ORS;  Service: Orthopedics;  Laterality: Right;   LAPAROSCOPIC HYSTERECTOMY     Family History  Problem Relation Age of Onset   Diabetes Mother    Hypertension Mother    Kidney disease Father    Heart disease Father        died from mi   Cancer Father        prostate   Hypertension Sister    Colon polyps Sister    Diabetes Sister    Hypertension Sister    Diabetes Sister    Hypertension Brother    Hypertension Brother    Cancer Paternal Grandmother        head and neck   Cancer Son    Colon cancer Neg Hx    Breast cancer Neg Hx    Adrenal disorder Neg Hx    Social History   Socioeconomic History   Marital status: Married    Spouse name: Not on file   Number of children: 3   Years of education: Not on file   Highest education level: 12th grade  Occupational History   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  Tobacco Use   Smoking status: Former    Current packs/day: 0.00    Types: Cigarettes    Quit date: 12/07/1997    Years since quitting: 25.9   Smokeless tobacco: Never  Vaping Use   Vaping status: Never Used  Substance and Sexual Activity   Alcohol use: Not Currently   Drug use: No   Sexual activity: Not Currently  Other Topics Concern   Not on file  Social History Narrative   Regular Exercise -  NO   Daily Caffeine Use:  2-3 soda/sw tea      Retired   Chief Executive Officer Drivers of Corporate Investment Banker Strain: Low Risk  (10/26/2023)   Overall Financial Resource Strain (CARDIA)    Difficulty of Paying Living Expenses: Not hard at all  Food Insecurity: No Food Insecurity (10/26/2023)   Hunger Vital Sign    Worried About Running Out of Food in the Last Year: Never true    Ran Out of Food in the Last Year: Never true  Transportation Needs: No Transportation Needs  (10/26/2023)   PRAPARE - Administrator, Civil Service (Medical): No    Lack of Transportation (Non-Medical): No  Physical Activity: Inactive (10/26/2023)   Exercise Vital Sign    Days of Exercise per Week: 0 days    Minutes of Exercise per Session: 0 min  Stress: No Stress Concern Present (10/26/2023)   [employer] of Occupational Health - Occupational Stress Questionnaire    Feeling of Stress: Not at all  Social Connections: Socially Integrated (10/26/2023)   Social Connection and Isolation Panel    Frequency of Communication with Friends and Family: More than three times a week  Frequency of Social Gatherings with Friends and Family: Twice a week    Attends Religious Services: More than 4 times per year    Active Member of [employer] or Organizations: Yes    Attends Engineer, Structural: More than 4 times per year    Marital Status: Married    Tobacco Counseling Counseling given: Not Answered    Clinical Intake:  Pre-visit preparation completed: Yes  Pain : No/denies pain     BMI - recorded: 3938 Nutritional Status: BMI > 30  Obese Nutritional Risks: None Diabetes: Yes CBG done?: No  Lab Results  Component Value Date   HGBA1C 5.9 (A) 07/23/2023   HGBA1C 5.9 (A) 04/23/2023   HGBA1C 5.8 (A) 10/19/2022     How often do you need to have someone help you when you read instructions, pamphlets, or other written materials from your doctor or pharmacy?: 1 - Never  Interpreter Needed?: No  Information entered by :: R. Deidra Spease LPN   Activities of Daily Living     10/26/2023    1:54 PM  In your present state of health, do you have any difficulty performing the following activities:  Hearing? 1  Vision? 0  Difficulty concentrating or making decisions? 0  Walking or climbing stairs? 0  Dressing or bathing? 0  Doing errands, shopping? 0  Preparing Food and eating ? N  Using the Toilet? N  In the past six months, have you accidently leaked  urine? N  Do you have problems with loss of bowel control? N  Managing your Medications? N  Managing your Finances? N  Housekeeping or managing your Housekeeping? N    Patient Care Team: Dineen Rollene MATSU, FNP as PCP - General (Family Medicine) Perla Evalene PARAS, MD as PCP - Cardiology (Cardiology) Babara Call, MD as Consulting Physician (Hematology and Oncology) Pa, [facility]  I have updated your Care Teams any recent Medical Services you may have received from other providers in the past year.     Assessment:   This is a routine wellness examination for [place].  Hearing/Vision screen Hearing Screening - Comments:: Wears aids Vision Screening - Comments:: glasses   Goals Addressed             This Visit's Progress    Patient Stated       Wants to get back in the gym       Depression Screen     10/29/2023    8:58 AM 04/23/2023   10:25 AM 10/24/2022   12:54 PM 10/19/2022   11:09 AM 04/14/2022    8:57 AM 10/19/2021    9:06 AM 10/05/2021    2:39 PM  PHQ 2/9 Scores  PHQ - 2 Score 0 0 0 0 0 0 0  PHQ- 9 Score 0  0        Fall Risk     10/26/2023    1:54 PM 07/23/2023    9:49 AM 04/23/2023   10:25 AM 10/23/2022    6:06 PM 10/19/2022   11:08 AM  Fall Risk   Falls in the past year? 0 0 0 0 0  Number falls in past yr: 0 0 0 0 0  Injury with Fall? 0 0 0 0 0  Risk for fall due to : No Fall Risks No Fall Risks No Fall Risks No Fall Risks No Fall Risks  Follow up Falls evaluation completed;Falls prevention discussed Falls evaluation completed Falls evaluation completed Falls prevention discussed;Falls evaluation completed  Falls evaluation completed    MEDICARE RISK AT HOME:  Medicare Risk at Home Any stairs in or around the home?: (Patient-Rptd) Yes If so, are there any without handrails?: (Patient-Rptd) No Home free of loose throw rugs in walkways, pet beds, electrical cords, etc?: (Patient-Rptd) Yes Adequate lighting in your home to reduce risk of  falls?: (Patient-Rptd) Yes Life alert?: (Patient-Rptd) No Use of a cane, walker or w/c?: (Patient-Rptd) No Grab bars in the bathroom?: (Patient-Rptd) Yes Shower chair or bench in shower?: (Patient-Rptd) Yes Elevated toilet seat or a handicapped toilet?: (Patient-Rptd) Yes  TIMED UP AND GO:  Was the test performed?  No  Cognitive Function: 6CIT completed        10/29/2023    9:02 AM 10/24/2022   12:59 PM 10/19/2021    9:09 AM 10/14/2020    9:50 AM 10/11/2018   11:55 AM  6CIT Screen  What Year? 0 points 0 points 0 points 0 points 0 points  What month? 0 points 0 points 0 points 0 points 0 points  What time? 0 points 0 points 0 points 0 points 0 points  Count back from 20 0 points 0 points 0 points 0 points 0 points  Months in reverse 0 points 0 points 0 points 0 points 0 points  Repeat phrase 0 points 2 points 0 points 0 points 0 points  Total Score 0 points 2 points 0 points 0 points 0 points    Immunizations Immunization History  Administered Date(s) Administered    sv, Bivalent, Protein Subunit Rsvpref,pf (Abrysvo) 11/16/2021   Fluad Quad(high Dose 65+) 09/28/2018, 09/16/2019, 10/14/2020, 10/14/2021, 09/24/2023   Fluad Trivalent(High Dose 65+) 10/19/2022   Influenza Split 09/08/2010   Influenza,inj,Quad PF,6+ Mos 10/19/2016   Influenza-Unspecified 10/10/2011, 10/18/2012, 10/17/2013, 10/23/2014, 10/22/2015, 11/05/2017   Moderna Sars-Covid-2 Vaccination 03/06/2019, 04/03/2019, 11/10/2019, 08/02/2020, 12/05/2022   Pfizer(Comirnaty)Fall Seasonal Vaccine 12 years and older 11/16/2021   Pneumococcal Conjugate-13 04/18/2017, 09/16/2019   Pneumococcal Polysaccharide-23 12/08/2020   Tdap 06/09/2011   Zoster Recombinant(Shingrix) 06/11/2017, 09/06/2017   Zoster, Live 09/17/2011    Screening Tests Health Maintenance  Topic Date Due   DTaP/Tdap/Td (2 - Td or Tdap) 06/08/2021   OPHTHALMOLOGY EXAM  04/06/2023   COVID-19 Vaccine (7 - 2025-26 season) 09/03/2023   Diabetic  kidney evaluation - eGFR measurement  10/19/2023   Medicare Annual Wellness (AWV)  10/24/2023   FOOT EXAM  10/19/2023   HEMOGLOBIN A1C  01/23/2024   Colonoscopy  05/05/2024   Diabetic kidney evaluation - Urine ACR  05/09/2024   DEXA SCAN  09/11/2024   Mammogram  09/12/2024   Pneumococcal Vaccine: 50+ Years  Completed   Influenza Vaccine  Completed   Hepatitis C Screening  Completed   Zoster Vaccines- Shingrix  Completed   Meningococcal B Vaccine  Aged Out    Health Maintenance Items Addressed: Discussed the need to update tetanus and covid vaccines. Patient needs foot exam and lab work at next office visit.   Additional Screening:  Vision Screening: Recommended annual ophthalmology exams for early detection of glaucoma and other disorders of the eye. Is the patient up to date with their annual eye exam?  Yes  Who is the provider or what is the name of the office in which the patient attends annual eye exams?  [facility]   Will send for last office notes.  Dental Screening: Recommended annual dental exams for proper oral hygiene  Community Resource Referral / Chronic Care Management: CRR required this visit?  No  CCM required this visit?  No   Plan:    I have personally reviewed and noted the following in the patient's chart:   Medical and social history Use of alcohol, tobacco or illicit drugs  Current medications and supplements including opioid prescriptions. Patient is not currently taking opioid prescriptions. Functional ability and status Nutritional status Physical activity Advanced directives List of other physicians Hospitalizations, surgeries, and ER visits in previous 12 months Vitals Screenings to include cognitive, depression, and falls Referrals and appointments  In addition, I have reviewed and discussed with patient certain preventive protocols, quality metrics, and best practice recommendations. A written personalized care plan for preventive  services as well as general preventive health recommendations were provided to patient.   Angeline Fredericks, LPN   [id number]   After Visit Summary: (MyChart) Due to this being a telephonic visit, the after visit summary with patients personalized plan was offered to patient via MyChart   Notes: Nothing significant to report at this time.  Patient needs a diabetic foot exam and lab work at next office visit.

## 2023-10-29 NOTE — Patient Instructions (Signed)
 [name],  Thank you for taking the time for your Medicare Wellness Visit. I appreciate your continued commitment to your health goals. Please review the care plan we discussed, and feel free to reach out if I can assist you further.  Medicare recommends these wellness visits once per year to help you and your care team stay ahead of potential health issues. These visits are designed to focus on prevention, allowing your provider to concentrate on managing your acute and chronic conditions during your regular appointments.  Please note that Annual Wellness Visits do not include a physical exam. Some assessments may be limited, especially if the visit was conducted virtually. If needed, we may recommend a separate in-person follow-up with your provider.  Ongoing Care Seeing your primary care provider every 3 to 6 months helps us  monitor your health and provide consistent, personalized care.  Remember to get your tetanus and covid vaccines at your pharmacy.   Referrals If a referral was made during today's visit and you haven't received any updates within two weeks, please contact the referred provider directly to check on the status.  Recommended Screenings:  Health Maintenance  Topic Date Due   DTaP/Tdap/Td vaccine (2 - Td or Tdap) 06/08/2021   Eye exam for diabetics  04/06/2023   COVID-19 Vaccine (7 - 2025-26 season) 09/03/2023   Yearly kidney function blood test for diabetes  10/19/2023   Complete foot exam   10/19/2023   Hemoglobin A1C  01/23/2024   Colon Cancer Screening  05/05/2024   Yearly kidney health urinalysis for diabetes  05/09/2024   DEXA scan (bone density measurement)  09/11/2024   Breast Cancer Screening  09/12/2024   Medicare Annual Wellness Visit  10/28/2024   Pneumococcal Vaccine for age over 36  Completed   Flu Shot  Completed   Hepatitis C Screening  Completed   Zoster (Shingles) Vaccine  Completed   Meningitis B Vaccine  Aged Out       10/29/2023     9:02 AM  Advanced Directives  Does Patient Have a Medical Advance Directive? No  Would patient like information on creating a medical advance directive? No - Patient declined   Advance Care Planning is important because it: Ensures you receive medical care that aligns with your values, goals, and preferences. Provides guidance to your family and loved ones, reducing the emotional burden of decision-making during critical moments.  Vision: Annual vision screenings are recommended for early detection of glaucoma, cataracts, and diabetic retinopathy. These exams can also reveal signs of chronic conditions such as diabetes and high blood pressure.  Dental: Annual dental screenings help detect early signs of oral cancer, gum disease, and other conditions linked to overall health, including heart disease and diabetes.  Please see the attached documents for additional preventive care recommendations.

## 2023-11-15 ENCOUNTER — Other Ambulatory Visit: Payer: Self-pay | Admitting: Family

## 2023-11-15 DIAGNOSIS — I10 Essential (primary) hypertension: Secondary | ICD-10-CM

## 2023-11-18 ENCOUNTER — Other Ambulatory Visit: Payer: Self-pay | Admitting: Family

## 2023-11-18 DIAGNOSIS — I10 Essential (primary) hypertension: Secondary | ICD-10-CM

## 2023-11-23 ENCOUNTER — Ambulatory Visit: Admitting: Family

## 2023-11-23 ENCOUNTER — Encounter: Payer: Self-pay | Admitting: Family

## 2023-11-23 VITALS — BP 130/70 | HR 89 | Temp 98.7°F | Ht 65.0 in | Wt 235.6 lb

## 2023-11-23 DIAGNOSIS — K649 Unspecified hemorrhoids: Secondary | ICD-10-CM | POA: Diagnosis not present

## 2023-11-23 DIAGNOSIS — R7309 Other abnormal glucose: Secondary | ICD-10-CM

## 2023-11-23 DIAGNOSIS — Z7984 Long term (current) use of oral hypoglycemic drugs: Secondary | ICD-10-CM

## 2023-11-23 DIAGNOSIS — E118 Type 2 diabetes mellitus with unspecified complications: Secondary | ICD-10-CM | POA: Diagnosis not present

## 2023-11-23 LAB — COMPREHENSIVE METABOLIC PANEL WITH GFR
ALT: 7 U/L (ref 0–35)
AST: 12 U/L (ref 0–37)
Albumin: 4 g/dL (ref 3.5–5.2)
Alkaline Phosphatase: 95 U/L (ref 39–117)
BUN: 17 mg/dL (ref 6–23)
CO2: 27 meq/L (ref 19–32)
Calcium: 10.2 mg/dL (ref 8.4–10.5)
Chloride: 109 meq/L (ref 96–112)
Creatinine, Ser: 1.58 mg/dL — ABNORMAL HIGH (ref 0.40–1.20)
GFR: 32.58 mL/min — ABNORMAL LOW (ref >60.00)
Glucose, Bld: 113 mg/dL — ABNORMAL HIGH (ref 70–99)
Potassium: 4.1 meq/L (ref 3.5–5.1)
Sodium: 141 meq/L (ref 135–145)
Total Bilirubin: 0.4 mg/dL (ref 0.2–1.2)
Total Protein: 7 g/dL (ref 6.0–8.3)

## 2023-11-23 LAB — LIPID PANEL
Cholesterol: 134 mg/dL (ref 0–200)
HDL: 48.5 mg/dL (ref >39.00)
LDL Cholesterol: 68 mg/dL (ref 0–99)
NonHDL: 85.76
Total CHOL/HDL Ratio: 3
Triglycerides: 89 mg/dL (ref 0.0–149.0)
VLDL: 17.8 mg/dL (ref 0.0–40.0)

## 2023-11-23 LAB — POCT GLYCOSYLATED HEMOGLOBIN (HGB A1C): Hemoglobin A1C: 5.8 % — AB (ref 4.0–5.6)

## 2023-11-23 MED ORDER — METFORMIN HCL ER 500 MG PO TB24: 500.0000 mg | ORAL_TABLET | Freq: Every day | ORAL | 3 refills | Status: AC

## 2023-11-23 MED ORDER — HYDROCORTISONE (PERIANAL) 2.5 % EX CREA: 1.0000 | TOPICAL_CREAM | Freq: Two times a day (BID) | CUTANEOUS | 0 refills | Status: AC

## 2023-11-23 NOTE — Patient Instructions (Addendum)
 Continue colace ( stool softener)  Start miralax every other day or every 3rd day  Start anusol  for 7 days  If blood persists, please let me know as I would recommend GI consult  Sitz baths  Hemorrhoids Hemorrhoids are swollen veins in and around the rectum or the opening of the butt (anus). There are two types of hemorrhoids: Internal. These occur in the veins just inside the rectum. They may poke through to the outside and become irritated and painful. External. These occur in the veins outside the anus. They can be felt as a painful swelling or hard lump near the anus. Most hemorrhoids do not cause severe problems. Often, they can be treated at home with diet and lifestyle changes. If home treatments do not help, you may need a procedure to shrink or remove the hemorrhoids. What are the causes? Hemorrhoids are caused by pressure near the anus. This pressure may be caused by: Constipation or diarrhea. Straining to poop. Pregnancy. Obesity. Sitting or riding a bike for a long time. Heavy lifting or other things that cause you to strain. Anal sex. What are the signs or symptoms? Symptoms of this condition include: Pain. Anal itching or irritation. Bleeding from the rectum. Leakage of poop (stool). Swelling of the anus. One or more lumps around the anus. How is this diagnosed? Hemorrhoids can often be diagnosed through a visual exam. Other exams or tests may also be done, such as: A digital rectal exam. This is when your health care provider feels inside your rectum with a gloved finger. Anoscope. This is an exam of the anus using a small tube. A blood test, if you have lost a lot of blood. A sigmoidoscopy or colonoscopy. These are tests to look inside the colon using a tube with a camera on the end. How is this treated? In most cases, hemorrhoids can be treated at home with diet and lifestyle changes. If these changes do not help, you may need to have a procedure done. These  procedures can make the hemorrhoids smaller or fully remove them. Common procedures include: Rubber band ligation. Rubber bands are placed at the base of the hemorrhoids to cut off their blood supply. Sclerotherapy. Medicine is put into the hemorrhoids to shrink them. Infrared coagulation. A type of light energy is used to get rid of the hemorrhoids. Hemorrhoidectomy surgery. The hemorrhoids are removed during surgery. Then, the veins that supply them are tied off. Stapled hemorrhoidopexy surgery. The base of the hemorrhoid is stapled to the wall of the rectum. Follow these instructions at home: Medicines Take over-the-counter and prescription medicines only as told by your provider. Use medicated creams or medicines that are put in the rectum (suppositories) as told by your provider. Eating and drinking  Eat foods that are high in fiber, such as beans, whole grains, and fresh fruits and vegetables. Ask your provider about taking products that have fiber added to them (fiber supplements). Reduce the amount of fat in your diet. You can do this by eating low-fat dairy products, eating less red meat, and avoiding processed foods. Drink enough fluid to keep your pee (urine) pale yellow. Managing pain and swelling  Take warm sitz baths for 20 minutes, 3-4 times a day. This can help ease pain and discomfort. You may do this in a bathtub or you can use a portable sitz bath that fits over the toilet. If told, put ice on the affected area. It may help to use ice packs between sitz baths.  Put ice in a plastic bag. Place a towel between your skin and the bag. Leave the ice on for 20 minutes, 2-3 times a day. If your skin turns bright red, remove the ice right away to prevent skin damage. The risk of damage is higher if you cannot feel pain, heat, or cold. General instructions Exercise. Ask your provider how much and what kind of exercise is best for you. In general, you should do moderate exercise for  at least 30 minutes on most days of the week (150 minutes each week). You may want to try walking, biking, or yoga. Go to the bathroom when you have the urge to poop. Do not wait. Avoid straining to poop. Keep the anus dry and clean. Use wet toilet paper or moist towelettes after you poop. Do not sit on the toilet for a long time. This can increase blood pooling and pain. Where to find more information [employer] of Diabetes and Digestive and Kidney Diseases: stagesync.si Contact a health care provider if: You have more pain and swelling that do not get better with treatment. You have trouble pooping or you are not able to poop. You have pain or inflammation outside the area of the hemorrhoids. Get help right away if: You are bleeding from your rectum and you cannot get it to stop. This information is not intended to replace advice given to you by your health care provider. Make sure you discuss any questions you have with your health care provider. Document Revised: 08/31/2021 Document Reviewed: 08/31/2021 Elsevier Patient Education  2024 [employer].

## 2023-11-23 NOTE — Progress Notes (Signed)
 Assessment & Plan:  Controlled type 2 diabetes mellitus with complication, without long-term current use of insulin (HCC) -     metFORMIN  HCl ER; Take 1 tablet (500 mg total) by mouth daily.  Dispense: 90 tablet; Refill: 3  Elevated glucose -     POCT glycosylated hemoglobin (Hb A1C) -     Comprehensive metabolic panel with GFR -     Lipid panel  Hemorrhoids, unspecified hemorrhoid type Assessment & Plan: Bright red blood from rectum.  Hemorrhoids on exam.  Start Anusol .  Continue Colace.  Start MiraLAX every other day or every third day. If bleeding persist, referral for GI  Orders: -     Hydrocortisone  (Perianal); Place 1 Application rectally 2 (two) times daily for 7 days.  Dispense: 30 g; Refill: 0  Controlled diabetes mellitus type 2 with complications (HCC) Assessment & Plan:  Excellent control.  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  She continues to follow with Dr. Jerrye, at [facility] kidney, whom she sees next week; deferred labs today as patient has labs scheduled this week.  Will due to chronic kidney disease, excellent glycemic control we opted to decrease metformin  to metformin  500 mg ONCE daily. Continue Farxiga 10 mg,losartan  to 100 mg for renal protection.       Return precautions given.   Risks, benefits, and alternatives of the medications and treatment plan prescribed today were discussed, and patient expressed understanding.   Education regarding symptom management and diagnosis given to patient on AVS either electronically or printed.  No follow-ups on file.  [name], FNP  Subjective:    Patient ID: Jennifer Huff, female    DOB: [birth year], 73 y.o.   MRN: [id number]  CC: Jennifer Huff is a 73 y.o. female who presents today for follow up.   HPI: She complains of external hemorhoid which bothers her when she wipes.  She will see [facility].  Stool is not narrow. No abdominal pain, hematuria, blood in the stool, melana in color.  No coffee grounds.  No nsaids.  Endorses straining and constipation.   She is using colace.       Following with nephrology, Dr.Foster  Colonoscopy 2021; no hemorrhoids;  polyps; repeat in 5 years.  Allergies: Ace inhibitors and Codeine Current Outpatient Medications on File Prior to Visit  Medication Sig Dispense Refill   acetaminophen  (TYLENOL ) 500 MG tablet Take 1,000 mg by mouth daily as needed for mild pain.     amLODipine  (NORVASC ) 10 MG tablet Take 1 tablet (10 mg total) by mouth daily. 90 tablet 0   Cholecalciferol (VITAMIN D3) 2000 units TABS Take 2,000 Units by mouth at bedtime.      dapagliflozin propanediol (FARXIGA) 10 MG TABS tablet Take 10 mg by mouth daily.     docusate sodium  (COLACE) 100 MG capsule Take 1 capsule (100 mg total) by mouth daily. 90 capsule 0   famotidine  (PEPCID ) 20 MG tablet Take 1 tablet (20 mg total) by mouth daily. 90 tablet 1   ketorolac  (ACULAR ) 0.5 % ophthalmic solution Place 1 drop into the left eye 2 (two) times daily.     losartan  (COZAAR ) 100 MG tablet Take 1 tablet (100 mg total) by mouth daily. 90 tablet 0   metoprolol  tartrate (LOPRESSOR ) 25 MG tablet Take 1 tablet (25 mg total) by mouth 2 (two) times daily. 180 tablet 1   polyethylene glycol powder (GLYCOLAX/MIRALAX) 17 GM/SCOOP powder Take 1 Container by mouth as needed. Take one capful mix into water as  needed for constipation.     pravastatin  (PRAVACHOL ) 40 MG tablet Take 1 tablet (40 mg total) by mouth daily. 90 tablet 3   No current facility-administered medications on file prior to visit.    Review of Systems  Constitutional:  Negative for chills and fever.  Respiratory:  Negative for cough.   Cardiovascular:  Negative for chest pain and palpitations.  Gastrointestinal:  Positive for anal bleeding and constipation. Negative for blood in stool, diarrhea, nausea and vomiting.      Objective:    BP 130/70   Pulse 89   Temp 98.7 F (37.1 C) (Oral)   Ht 5' 5 (1.651 m)   Wt  235 lb 9.6 oz (106.9 kg)   SpO2 95%   BMI 39.21 kg/m  BP Readings from Last 3 Encounters:  11/23/23 130/70  07/23/23 126/80  04/23/23 128/78   Wt Readings from Last 3 Encounters:  11/23/23 235 lb 9.6 oz (106.9 kg)  10/29/23 244 lb (110.7 kg)  07/23/23 241 lb 6.4 oz (109.5 kg)    Physical Exam Vitals reviewed.  Constitutional:      Appearance: She is well-developed.  Eyes:     Conjunctiva/sclera: Conjunctivae normal.  Cardiovascular:     Rate and Rhythm: Normal rate and regular rhythm.     Pulses: Normal pulses.     Heart sounds: Normal heart sounds.  Pulmonary:     Effort: Pulmonary effort is normal.     Breath sounds: Normal breath sounds. No wheezing, rhonchi or rales.  Genitourinary:     Comments: Multiple nonthrombosed 2 to 3 cm external hemorrhoids.  Slightly tender on exam.  No bright red blood. Skin:    General: Skin is warm and dry.  Neurological:     Mental Status: She is alert.  Psychiatric:        Speech: Speech normal.        Behavior: Behavior normal.        Thought Content: Thought content normal.

## 2023-11-23 NOTE — Assessment & Plan Note (Addendum)
 Bright red blood from rectum.  Hemorrhoids on exam.  Start Anusol .  Continue Colace.  Start MiraLAX every other day or every third day. If bleeding persist, referral for GI

## 2023-11-23 NOTE — Assessment & Plan Note (Signed)
  Excellent control.  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  She continues to follow with Dr. Jerrye, at [facility] kidney, whom she sees next week; deferred labs today as patient has labs scheduled this week.  Will due to chronic kidney disease, excellent glycemic control we opted to decrease metformin  to metformin  500 mg ONCE daily. Continue Farxiga 10 mg,losartan  to 100 mg for renal protection.

## 2023-11-26 ENCOUNTER — Ambulatory Visit: Payer: Self-pay | Admitting: Family

## 2024-02-22 ENCOUNTER — Ambulatory Visit: Admitting: Family

## 2024-10-31 ENCOUNTER — Ambulatory Visit
